# Patient Record
Sex: Female | Born: 1965 | Race: Black or African American | Hispanic: No | Marital: Married | Smoking: Former smoker
Health system: Southern US, Community
[De-identification: ages and names within clinical notes are randomized; demographics above are authoritative.]

## PROBLEM LIST (undated history)

## (undated) DIAGNOSIS — D649 Anemia, unspecified: Secondary | ICD-10-CM

## (undated) DIAGNOSIS — D259 Leiomyoma of uterus, unspecified: Secondary | ICD-10-CM

## (undated) DIAGNOSIS — I82402 Acute embolism and thrombosis of unspecified deep veins of left lower extremity: Secondary | ICD-10-CM

## (undated) DIAGNOSIS — B019 Varicella without complication: Secondary | ICD-10-CM

## (undated) HISTORY — DX: Anemia, unspecified: D64.9

## (undated) HISTORY — PX: TUBAL LIGATION: SHX77

## (undated) HISTORY — PX: CARDIAC CATHETERIZATION: SHX172

## (undated) HISTORY — DX: Varicella without complication: B01.9

---

## 1999-02-17 ENCOUNTER — Other Ambulatory Visit: Admission: RE | Admit: 1999-02-17 | Discharge: 1999-02-17 | Payer: Self-pay | Admitting: *Deleted

## 1999-10-02 ENCOUNTER — Encounter (HOSPITAL_COMMUNITY): Admission: RE | Admit: 1999-10-02 | Discharge: 1999-10-11 | Payer: Self-pay | Admitting: *Deleted

## 1999-10-02 ENCOUNTER — Inpatient Hospital Stay (HOSPITAL_COMMUNITY): Admission: AD | Admit: 1999-10-02 | Discharge: 1999-10-02 | Payer: Self-pay | Admitting: *Deleted

## 1999-10-10 ENCOUNTER — Inpatient Hospital Stay (HOSPITAL_COMMUNITY): Admission: AD | Admit: 1999-10-10 | Discharge: 1999-10-10 | Payer: Self-pay | Admitting: Obstetrics & Gynecology

## 1999-10-11 ENCOUNTER — Inpatient Hospital Stay (HOSPITAL_COMMUNITY): Admission: AD | Admit: 1999-10-11 | Discharge: 1999-10-15 | Payer: Self-pay | Admitting: Obstetrics & Gynecology

## 2000-07-01 ENCOUNTER — Other Ambulatory Visit: Admission: RE | Admit: 2000-07-01 | Discharge: 2000-07-01 | Payer: Self-pay | Admitting: Obstetrics & Gynecology

## 2001-05-27 ENCOUNTER — Other Ambulatory Visit: Admission: RE | Admit: 2001-05-27 | Discharge: 2001-05-27 | Payer: Self-pay | Admitting: *Deleted

## 2002-05-22 ENCOUNTER — Other Ambulatory Visit: Admission: RE | Admit: 2002-05-22 | Discharge: 2002-05-22 | Payer: Self-pay | Admitting: *Deleted

## 2003-09-18 ENCOUNTER — Other Ambulatory Visit: Admission: RE | Admit: 2003-09-18 | Discharge: 2003-09-18 | Payer: Self-pay | Admitting: Obstetrics and Gynecology

## 2004-01-08 ENCOUNTER — Encounter: Admission: RE | Admit: 2004-01-08 | Discharge: 2004-01-08 | Payer: Self-pay | Admitting: Obstetrics and Gynecology

## 2004-01-30 ENCOUNTER — Inpatient Hospital Stay (HOSPITAL_COMMUNITY): Admission: AD | Admit: 2004-01-30 | Discharge: 2004-01-30 | Payer: Self-pay | Admitting: Obstetrics and Gynecology

## 2004-01-31 ENCOUNTER — Inpatient Hospital Stay (HOSPITAL_COMMUNITY): Admission: AD | Admit: 2004-01-31 | Discharge: 2004-01-31 | Payer: Self-pay | Admitting: Obstetrics and Gynecology

## 2004-02-01 ENCOUNTER — Inpatient Hospital Stay (HOSPITAL_COMMUNITY): Admission: AD | Admit: 2004-02-01 | Discharge: 2004-02-10 | Payer: Self-pay | Admitting: Obstetrics & Gynecology

## 2004-02-07 ENCOUNTER — Encounter (INDEPENDENT_AMBULATORY_CARE_PROVIDER_SITE_OTHER): Payer: Self-pay | Admitting: Specialist

## 2004-02-13 ENCOUNTER — Ambulatory Visit (HOSPITAL_COMMUNITY): Admission: RE | Admit: 2004-02-13 | Discharge: 2004-02-13 | Payer: Self-pay | Admitting: Obstetrics and Gynecology

## 2005-09-07 HISTORY — PX: BUNIONECTOMY: SHX129

## 2008-01-04 ENCOUNTER — Other Ambulatory Visit: Admission: RE | Admit: 2008-01-04 | Discharge: 2008-01-04 | Payer: Self-pay | Admitting: Gynecology

## 2011-01-06 ENCOUNTER — Other Ambulatory Visit: Payer: Self-pay | Admitting: Obstetrics and Gynecology

## 2011-01-06 ENCOUNTER — Other Ambulatory Visit (HOSPITAL_COMMUNITY)
Admission: RE | Admit: 2011-01-06 | Discharge: 2011-01-06 | Disposition: A | Discharge status: Home / Self Care | Payer: BC Managed Care – PPO | Source: Ambulatory Visit | Attending: Obstetrics and Gynecology | Admitting: Obstetrics and Gynecology

## 2011-01-06 DIAGNOSIS — Z09 Encounter for follow-up examination after completed treatment for conditions other than malignant neoplasm: Secondary | ICD-10-CM

## 2011-01-06 DIAGNOSIS — Z01419 Encounter for gynecological examination (general) (routine) without abnormal findings: Secondary | ICD-10-CM | POA: Insufficient documentation

## 2011-01-13 ENCOUNTER — Ambulatory Visit
Admission: RE | Admit: 2011-01-13 | Discharge: 2011-01-13 | Disposition: A | Discharge status: Home / Self Care | Payer: BC Managed Care – PPO | Source: Ambulatory Visit | Attending: Obstetrics and Gynecology | Admitting: Obstetrics and Gynecology

## 2011-01-13 ENCOUNTER — Other Ambulatory Visit: Payer: Self-pay | Admitting: Obstetrics and Gynecology

## 2011-01-13 DIAGNOSIS — Z09 Encounter for follow-up examination after completed treatment for conditions other than malignant neoplasm: Secondary | ICD-10-CM

## 2011-01-23 NOTE — Discharge Summary (Signed)
[name], [name]                            ACCOUNT NO.:  [id number]   MEDICAL RECORD NO.:  [id number]                   PATIENT TYPE:  INP   LOCATION:  9374                                 FACILITY:  [facility]   PHYSICIAN:  Maxie Better, M.D.            DATE OF BIRTH:  11-20-1965   DATE OF ADMISSION:  02/01/2004  DATE OF DISCHARGE:  02/10/2004                                 DISCHARGE SUMMARY   ADMISSION DIAGNOSES:  1. Mild preeclampsia.  2. Intrauterine gestation at 29 weeks.  3. Probable early intrauterine growth restriction.  4. Previous cesarean section.  5. Desires permanent sterilization.   DISCHARGE DIAGNOSES:  1. Severe preeclampsia.  2. Intrauterine growth restriction delivered.  3. Previous cesarean section.  4. Desires sterilization.  5. Intrauterine gestation at 30 weeks delivered.  6. Pulmonary edema resolving.  7. Left paratubal cyst.   PROCEDURE:  On February 07, 2004, was a repeat cesarean section, modified Pomeroy  tubal ligation, left paratubal cyst removal.   HISTORY OF PRESENT ILLNESS:  This 45 year old gravida 2, para 0-1-0-1  married black female at [redacted] weeks gestation, which is consistent with a first  trimester ultrasound done on January 11th at 9 and 4/7th weeks, admitted for  close fetal surveillance secondary to newly diagnosed mild preeclampsia.  The patient's prenatal course had been uncomplicated until May 24th, at  which time she presented for her routine obstetrical care and was noted to  have a blood pressure of 130/90 with proteinuria.  PIH labs were ordered, 24-  urine was done as well.  Ultrasound done on May 24th showed estimated fetal  weight of 1 pound 14 ounces.  The patient was seen by perinatologist, Dr.  Royston Sinner regarding this ultrasound.  The estimated fetal weight there was 920  grams which is about 2 pounds, 19th percentile, but her abdominal  circumferential was lagging about 2 weeks.  The umbilical Dopplers were  slightly  elevated.  The [facility] labs showed a platelet count of 169,000,  hematocrit was 38.7, her SGOT was 27, her urine had 600 mg of protein  in a  24 hour period.  The patient was placed on bedrest as an outpatient, and  presented on May 27th for a nonstress test at which time she had increased  protein on dipstick, blood pressures were 140/88.  It was thought that this  patient would be better served by being inpatient with close monitoring.  Her obstetrical history had been notable for a preterm delivery at 35 weeks  due to pregnancy induced hypertension, she had a low transverse cesarean  section.  The patient expressed desire for primary sterilization early in  the prenatal care.   HOSPITAL COURSE:  The patient was admitted to [facility].  NICU  consultation was obtained.  Group B strep culture was performed.  PIH labs  were reordered.  On hospital day #1, the patient had a blood pressure  of  159/102, 160/104.  Betamethasone had been completed.  Magnesium was  subsequently started.  Repeat PIH labs showed a uric acid of 4.4, platelet  count was 201,000,  24-hour urine was still pending.  Ultrasound done which  showed an amniotic fluid index of 12.2, biophysical profile was 6 out of 8  vertex.  The Dopplers were normal.  The fetal monitoring had decreased  variability, however there was no deceleration.  She was also started on  Aldomet.  The patient was followed up closely with daily biophysical profile  and Dopplers.  She was remained on continuous monitoring and PIH labs were  then repeated.  Her 24-urine collection subsequently showed 2688 mg worth of  protein.  The PIH labs remained stable.  Her Dopplers were slightly elevated  on Feb 04, 2004, and the middle cerebral artery was normal.  The patient, on  hospital day #4, was complaining of tightening in her chest, her exam was  notable for decreased breath sounds at her bases.  Tracing had a fetal heart  rate of 130s with decreased  long term variability.  Her abdomen was gravid,  nontender.  A chest x-ray was ordered that showed mild infiltrate, probable  pulmonary edema.  Lasix was given intravenously.  The patient was monitored  closely.  Her chest tightness subsequently resolved after the intravenous  Lasix.  The patient had a recurrence of her shortness of breath on Feb 05, 2004, her magnesium was decreased to 1.5 grams per hour, repeat chest x-ray  was performed.  On February 07, 2004, the Aldomet had been increased to 500 mg  t.i.d., magnesium sulfate had been discontinued.  On February 07, 2004, I have  reviewed her hospital course  the date while in my absence, decision was  made to proceed with the delivery of this infant, primarily for maternal  indications.  The patient was still desired her primary sterilization.  A  repeat chest x-ray was ordered that still showed pulmonary edema.  The  patient was taken to the operating room where she underwent a repeat  cesarean section, modified Pomeroy tubal ligation and left paratubal cyst  removal.  Her lower uterine segment was noted to be paper thin.  Apgar's  were seven and eight.  A live female was delivered, she weighed 2.3 pounds,  cord pH was 7.33, her placenta which was small was sent to pathology.  The  report from the pathology is not available in the chart at the time of this  dictation.  Postoperatively the patient was placed in the intensive care  unit.  She had some rales in the recovery room for which she received  another dose of Lasix.  She was started on magnesium with limited total  fluid volume, and hydrochlorothiazide was also started as well, her Aldomet  was continued.  The patient had a good amount of diuresis noted.  Her workup  for her severe preeclampsia had included a lupus, an antiphospholipid  antibody panel, all of which were negative.  On February 08, 2004, the patient started to have increasing shortness of breath, her O2 sat was about 93% on   room air, magnesium level was 6, magnesium was discontinued around 3:00.  Her exam clinically was the lungs with decreased breath sounds throughout,  her extremities -- she had trace edema bilaterally, nontender.  Neuro exam  was nonfocal.  Chest x-ray was repeated -- bilateral pleural edema with  increased opacity in the left lower quadrant was  noted, mild increase in  cardiac enlargement.  The patient was started on oxygen.  Critical Care was  called for consultation.  BNP was ordered.  The plan was if elevated, a 2D  echo would be performed.  The patient was aggressively diuresed.  She was  seen by one of the pulmonary critical care physicians.  The patient was much  improved by postop day #3.  The baby was in the NICU and doing well.  The  patient was then able to lay flat.  She was more comfortable.  She was not  short of breath.  She had passed flatus.  Her pain management was well-  controlled.  Her lungs were clear to auscultation bilaterally.  The incision  had no erythema, induration, was stable.  Her blood pressures were 110-  130/70s to 80s, 97% oxygenation on room air, temp was 97.5.  She reported  some right knee pain that had been present for about three weeks, and had  been using nonsteroidals and Tylenol p.r.n., and planned for orthopedic  followup.  The patient wanted to go home.  The patient was felt to be  clinically improved.  She was deemed ready to be discharged home.   DISPOSITION:  Home.   CONDITION:  Stable.   DISCHARGE MEDICATIONS:  1. Hydrochlorothiazide 25 mg 1 p.o. daily.  2. Prenatal vitamins 1 p.o. daily.  3. Tylox 1-2 tabs every 3-4 hours p.r.n. pain.  4. Motrin 800 mg 1 p.o. q.6-8 hours p.r.n. pain.   FOLLOWUP:  Followup appointment in [facility] with blood pressure check  on Wednesday, and BP checks at home.  Discharge instructions were given  which included a postpartum booklet, as well as to call for blood pressures  -- diastolics greater or  equal to 105 and systolic greater or equal to 150.                                               Maxie Better, M.D.    [place]/MEDQ  D:  02/23/2004  T:  02/25/2004  Job:  [id number]

## 2011-01-23 NOTE — Op Note (Signed)
[facility]  Patient:    [name]                          MRN: [id number] Proc. Date: 10/12/99 Adm. Date:  [id number] Attending:  Cleatrice Burke                           Operative Report  PREOPERATIVE DIAGNOSIS:       Fetal bradycardia.  POSTOPERATIVE DIAGNOSIS:      Fetal bradycardia.  PROCEDURE:                    Low transverse cesarean delivery/  SURGEON:                      Cecilio Asper, M.D.  ANESTHESIA:                   Epidural.  ESTIMATED BLOOD LOSS:         400 cc.  URINE OUTPUT:                 600 cc.  FINDINGS:                     Viable female infant named [name], Apgars 8 and  weighing 3 pounds 10 ounces.  Fundal fibroid, normal tubes and ovaries.  The cord gas was 7.09.  COMPLICATIONS:                None.  HISTORY:                      The patient is a 45 year old gravida 1 who presented on October 11, 1999 for a repeat CST secondary to an equivocal CST on February nd. The patients CST had a late deceleration after a contraction.  At that point, the patient was admitted for induction of labor.  The patient had Cervidil on the afternoon of October 11, 1999. The Cytotec was dislodged at approximately 8 p.m. and therefore the patients membranes were ruptured.  An attempt to place an intrauterine pressure catheter was attempted at that time.  It could not be performed.  The patient was then started on Pitocin augmentation.  Throughout the night, the Pitocin was slowly increased.  The patient had epidural placed around 3 a.m.  At that point the fetal heart tracing showed deep variables with each contraction, so the Pitocin was discontinued.  The plan at that point was to give the infant a time to rest and then restart the Pitocin.  Should variable represent IUPC would be placed to start amnio effusion.  Variables did represent, but this [name] was not notified.  At approximately 7:30 a.m. upon  reevaluation of the  patient, it was noted that the Pitocin was discontinued, the cervix was 3 cm, 100% effaced and at that point there were no variable decelerations.  Intrauterine pressure catheter was then placed.  At that point, the fetal heart tracings sustained the bradycardia that did not resolve with oxygen by face mask, changing positions or increasing IV fluids.  The patient was then consented for a STAT cesarean delivery .  DESCRIPTION OF PROCEDURE:     After an adequate level of epidural anesthesia was obtained, the patient was quickly prepped and draped in a sterile fashion.  Low  transverse incision was made down through subcutaneous fat to the  fascia and the fascia was incised with the scalpel.  Kelly clamps were placed on the superior dge of the incision and the rectus muscles were removed from the fascia both superiorly and inferiorly.  Parietal peritoneum was entered by sharp and blunt dissection nd extended bluntly.  Bladder piece was placed inside the incision.  The uterus was entered in a low transverse cesarean fashion and extended laterally in both directions with the Mayo scissors.  The cord and the infants hand were presenting. The infants head was elevated and with fundal pressure she was delivered.  The cord was doubly clamped and cut and she was taken to the warmer where she was cared for by the respiratory team.  Cord bloods were obtained.  Placenta was manually  extracted.  The uterus was wiped clean with wet lap sponge.  Tubes and ovaries ere noted to be normal.  There was a fundal fibroid, mostly centered towards the patients left-hand side.  The bladder piece was replaced into the incision. The uterine incision was grasped with ring clamps and the uterus was reapproximated  with a running suture of #0 Vicryl.  A second imbricating layer was used with good hemostatic result.  The pelvis was copiously irrigated.  Hemostasis was assured. The  parietal peritoneum was then reapproximated.  The fascia was reapproximated  with a running suture of #1 Vicryl from the lateral edge to the midline.  The subcuticular layer was irrigated.  The skin was closed with staples.  The patient tolerated the procedure well.  She was taken to the recovery room in stable condition.  Needle, sponge and instrument counts were correct x 2. DD:  10/12/99 TD:  10/12/99 Job: [id number]

## 2011-01-23 NOTE — H&P (Signed)
[name], [name]                            ACCOUNT NO.:  [id number]   MEDICAL RECORD NO.:  [id number]                   PATIENT TYPE:  INP   LOCATION:  9159                                 FACILITY:  [facility]   PHYSICIAN:  Maxie Better, M.D.            DATE OF BIRTH:  September 22, 1965   DATE OF ADMISSION:  02/01/2004  DATE OF DISCHARGE:                                HISTORY & PHYSICAL   CHIEF COMPLAINT:  Preeclampsia at 41 weeks' gestation.   HISTORY OF PRESENT ILLNESS:  This is a 45 year old gravida 2, para 0-1-0-1,  married black female, EDC of April 18, 2004, based on first trimester  ultrasound done on September 18, 2003 at 9 weeks and 4 days' gestation, who is  currently at 67 weeks' gestation, admitted for close fetal surveillance  secondary to newly diagnosed preeclampsia.  The patient has had an  uncomplicated prenatal course until Jan 29, 2004, at which time she  presented for her routine obstetrical visit and was noted to have a blood  pressure of 130/90 and proteinuria.  Repeat blood pressures on her left side  were 148/98 and 142/92.  The patient has had lower extremity edema.  She  denies any headaches, visual changes or epigastric pain.  The patient had,  at the time of the visit on Jan 29, 2004, a 7-pound weight gain, compared to  the previous visit on Jan 08, 2004.  PIH labs were ordered; 24-hour urine  creatinine clearance and urine total protein were also ordered.  Due to the  elevated blood pressure, an ultrasound was ordered to estimate fetal weight  and an ultrasound done at [facility] on Jan 29, 2004 showed estimated  fetal weight of 1 pounds 14 ounces, which was at the 0 percentile, amniotic  fluid index of 50%, equaling 14.5, normal Dopplers, good fetal movement.  The patient was sent for second opinion due to this acute change in  ultrasound to Dr. Sherrie George at [facility],  consultation for a second  opinion, on the same day.  The patient's ultrasound at  their facility  revealed estimated fetal weight of 920 g, which was 2 pounds, which was at  the 19th percentile; however, the abdominal circumference measurement lagged  by 2 weeks.  The patient underwent umbilical Dopplers that showed slight  elevation.  The thought by the perinatologist was that this most likely is  probably a growth-restricted fetus secondary to placental insufficiency due  to the maternal hypertension.  Recommendations were made at the time for  twice-weekly nonstress tests, weekly amniotic fluid index and Dopplers and a  repeat growth scan in 3 weeks, with the patient being on bedrest and close  followup for her hypertension.  The [facility] labs that were ordered showed a  platelet count of 169,000, a hematocrit of 38.7, creatinine of 1.0, uric  acid of 5.3, SGOT of 27.  The patient's 24-hour urine collection showed  a  urine total volume of 1700, creatinine clearance of 68.7 mL/min in her 24-  hour urine collection of 600 mg in a 24-hour period of time.  The patient  presented on Feb 01, 2004 for her nonstress test and followup of her blood  pressures.  The nonstress test was nonreactive, but her blood pressure was  130/88, repeat was 140/88 and a urine dip stick showed 300+ of protein with  trace blood.  Given the prior 24-hour urine collection results on Jan 31, 2004 and the ongoing blood pressure, diagnosis was made of preeclampsia and  it was felt that this patient would be best served by being inpatient with  close fetal surveillance.  Prenatal care is at [facility].  Primary  obstetrician -- Dr. Maxie Better.   PRENATAL LABORATORIES:  Blood type is A-positive.  Antibody screen is  negative.  Hemoglobin electrophoresis was normal.  RPR is nonreactive.  Rubella is immune.  Hepatitis B surface antigen is negative.  HIV test was  negative.  GC and Chlamydia cultures were negative.  Pap was normal.  The  patient had first trimester genetic screen that was  normal and open neural  tube defect testing at 16 weeks was normal.  The anatomic fetal survey was  done on December 04, 2003, at which time the anatomic fetal survey was normal.  Baby weighed 11 ounces, which was at the 16th percentile.  The patient had a  1-hour glucose tolerance test done which was normal.  She underwent steroid  injections on Jan 30, 2004 and Jan 31, 2004 at [facility].  Group B  strep culture ________.  Her booking blood pressure on September 18, 2003 was  90/72.  Her second trimester blood pressures were between 98/62 and 102/64.   ALLERGIES:  No known drug allergies.   MEDICATIONS:  Prenatal vitamins.   PAST MEDICAL HISTORY:  Medical history is negative.   SURGICAL HISTORY:  Cesarean section in 2001.   OBSTETRICAL HISTORY:  February of 2001, low transverse cesarean section, 4-  pound 11-ounce fetus at 35 weeks, for fetal bradycardia at 35 weeks'  gestation; that pregnancy had pregnancy-induced hypertension complications.   FAMILY HISTORY:  Family history is negative.   SOCIAL HISTORY:  Former smoker, quit with positive pregnancy test, married,  hairdresser, 1 daughter.   REVIEW OF SYSTEMS:  Review of systems is negative except as noted with the  history of present illness.   PHYSICAL EXAM:  GENERAL:  A gravid black female, well-developed, well-  nourished, appropriately upset.  VITAL SIGNS:  Blood pressure 130/88.  Weight of 177.2 pounds.  Fetal heart  rate 145-150.  SKIN:  Skin shows no lesions.  HEENT:  Anicteric sclerae.  Pink conjunctivae.  Oropharynx negative.  No  periorbital edema noted.  HEART:  Heart was regular rate and rhythm without murmur.  LUNGS:  Lungs are clear to auscultation.  BREASTS:  Breasts are soft and nontender, no palpable mass.  ABDOMEN:  Abdomen gravid, fundal height of 28 cm.  PELVIC:  Exam deferred.  EXTREMITIES:  Edema 1+.  No clonus.   IMPRESSION:  Early onset preeclampsia, intrauterine gestation at 28 weeks, probable  early intrauterine growth restriction.   PLAN:  Admission, continuous fetal monitoring, repeat a 24-hour creatinine  clearance and urine total protein as well as the PIH labs; also evaluate for  other etiologies for her hypertension such as lupus and antiphospholipid  antibody syndrome, lupus anticoagulant and anticardiolipin antibody as well  as an  ANA were therefore ordered.  Daily BPD/Dopplers to evaluate this  patient, including on admission.  Bedrest with bathroom privileges.  An NICU  consultation.  Group B strep culture.  The patient does desire to have a  repeat cesarean section as well as she also desires permanent sterilization;  the sterilization has been discussed at length with the patient and  alternative birth control methods have been declined.  This patient was  informed indications for delivery will be based on either maternal or fetal  indication.                                               Maxie Better, M.D.    [place]/MEDQ  D:  02/01/2004  T:  02/02/2004  Job:  [id number]

## 2011-01-23 NOTE — Op Note (Signed)
[name], [name]                            ACCOUNT NO.:  [id number]   MEDICAL RECORD NO.:  [id number]                   PATIENT TYPE:  INP   LOCATION:  9374                                 FACILITY:  [facility]   PHYSICIAN:  Maxie Better, M.D.            DATE OF BIRTH:  Jan 09, 1966   DATE OF PROCEDURE:  02/07/2004  DATE OF DISCHARGE:                                 OPERATIVE REPORT   PREOPERATIVE DIAGNOSES:  1. Severe preeclampsia.  2. Intrauterine growth restriction.  3. Intrauterine gestation at 30 weeks.  4. Previous cesarean section.  5. Desires sterilization.   PROCEDURES:  1. Repeat cesarean section.  2. Modified Pomeroy tubal ligation.  3. Left paratubal cyst removal.   POSTOPERATIVE DIAGNOSES:  1. Severe preeclampsia.  2. Intrauterine growth restriction.  3. Intrauterine pregnancy at 30 weeks.  4. Previous cesarean section.  5. Desires sterilization.  6. Left paratubal cyst.   ANESTHESIA:  Spinal.   SURGEON:  Maxie Better, M.D.   ASSISTANT:  Cordelia Pen A. Rosalio Macadamia, M.D.   PROCEDURE:  Under adequate spinal anesthesia, the patient was placed in a  supine position with a left lateral tilt.  An indwelling Foley catheter was  already in place.  The patient was sterilely prepped and draped in the usual  fashion.  The patient was noted to have a large Pfannenstiel keloid  incision.  Marcaine 0.25% was injected along the previous scar.  A  Pfannenstiel skin incision was made just above the previous scar, carried  down to the rectus fascia, rectus fascia incised in the midline and extended  bilaterally.  The rectus fascia was then bluntly and sharply dissected off  the rectus muscle in a superior and inferior fashion.  The rectus muscle was  then split in the midline.  The parietal peritoneum was entered superiorly.  On entering the abdominal cavity, it was noted that the lower uterine  segment was very thin.  The bladder was taken down off the lower uterine  segment and careful low transverse uterine incision was then made with a  very thin lower uterine segment noted and maintaining the bulging membranes  as intact.  With the transverse incision performed, palpation through the  bag was then noted and done.  Vertex presentation was still noted.  The  artificial rupture of membranes was performed, clear fluid was noted.  Subsequent delivery gently of a live female infant was then accomplished.  The cord was clamped, cut, the baby was bulb-suctioned on the abdomen.  The  baby was quickly transferred to the awaiting pediatricians, who assigned  Apgars of 7 and 8 at one and five minutes.  The placenta was small and  delivered spontaneously and sent to pathology.  The cord gases and cord pH  were performed.  Cord pH was subsequently 7.32.  The uterine cavity was then  cleaned of debris.  There was no uterine extension noted.  The uterine  incision was then closed with 0 Monocryl running locked stitch.  Small  bleeding along the peritoneal edges was then done.  Attention was then  turned to the tubes and ovaries bilaterally.  The left tube was noted to  have a paratubal cyst, but this was removed carefully.  The midportion of  the left fallopian tube was then identified.  The underlying mesosalpinx was  then opened using cautery.  The proximal and distal portion of that tube  segment were tied with 0 chromic x2 proximally and distally.  The  intervening portion of tube was then removed.  The mesosalpinx was  inspected, good hemostasis was noted.  On the contralateral side the same  procedure was repeated on the right tube with the midportion of the  fallopian tube removed as well.  The mesosalpinx had small bleeding, which  was then cauterized.  There were some peritubal adhesions noted, which were  then lysed.  Both ovaries were noted to be normal.  Attention was then  turned back to the uterine incision with good hemostasis noted.  The abdomen   was then copiously irrigated and suctioned of debris.  The vesicouterine  peritoneum was not closed.  The parietal peritoneum was not closed.  The  undersurface of the rectus muscle was inspected as well as the rectus fascia  and small bleeders cauterized.  Good hemostasis then noted.  The rectus  fascia was then closed with 0 Vicryl x2.  The keloid incision was then  removed and the subcutaneous area was approximated using interrupted 3-0  plain sutures.  The skin was approximated using Ethicon staples.  Specimen  was placenta, portion of the right and left fallopian tube, and left  paratubal cyst, sent to pathology.  Estimated blood loss was 200 mL.  Intraoperative fluid was 700 mL crystalloid.  Urine output was 250 mL clear  yellow urine.  Sponge and instrument count x2 was correct.  Complication was  none.  The patient's baby weighed 930 g.  The patient tolerated the  procedure well, was transferred to the recovery room in stable condition.                                               Maxie Better, M.D.    [place]  D:  02/07/2004  T:  02/08/2004  Job:  [id number]

## 2011-01-23 NOTE — Discharge Summary (Signed)
[facility]  Patient:    [name], [name]                         MRN: [id number] Adm. Date:  [id number] Disc. Date: [id number] Attending:  Cleatrice Burke Dictator:   Vance Gather Duplantis, C.N.M.                           Discharge Summary  ADMISSION DIAGNOSES:          1. Intrauterine pregnancy at term.                               2. History of chronic hypertension.                               3. History of oligohydramnios.                               4. History of equivocal contraction stress test on                                  October 10, 1999, with positive contraction stress                                  test on October 11, 1999.  DISCHARGE DIAGNOSES:          1. Intrauterine pregnancy at term.                               2. History of chronic hypertension.                               3. History of oligohydramnios.                               4. History of equivocal contraction stress test on                                  October 10, 1999, with positive contraction stress                                  test on October 11, 1999.                               5. Bottle feeding infant with small for gestational                                  age in the neonatal intensive care unit.                               6. Fetal bradycardia.  7. Desires oral contraceptives.  PROCEDURE THIS ADMISSION:    Primary low transverse cesarean section for delivery of a viable female infant named Marissa, who had Apgars of 8 and 9 and weighed 3 pounds 10 ounces, by Dr. Cleatrice Burke on October 12, 1999.  HOSPITAL COURSE:              Mrs. [name] is a 45 year old married black female gravida 1 para 0 at 78 weeks who is admitted for induction of labor secondary to oligohydramnios, hypertension, and a positive CST on October 11, 1999. She had an uninducible cervix on admission and was given  cervical ripening initially, and then was started on Pitocin throughout the night.  By the morning of October 11, 1999, she was 3 cm, 100%, and an IUPC was placed in order to begin amnioinfusion to resolve the variable decelerations that were noted while on Pitocin.  Subsequent to placing the IUPC, the fetal heart rate dropped to the 50s and did not resolve with position changes, IV fluids, or oxygen.  Patient was then sent to the OR for a STAT cesarean delivery, and was delivered of a viable female infant named Marissa, who had Apgars of 8 and 9 and weighed 3 pounds 10 ounces, by Dr. Cleatrice Burke on the morning of October 12, 1999.  Postoperatively, she has done well.  She is now ambulating, voiding, and eating without difficulty. Her vital signs reveal temperature has been afebrile, basically, since delivery and her blood pressures have been labile, with some elevations up to diastolics of 100, but the majority in the 70s-80s.  She is bottle feeding.  Her infant is in the NICU and is doing very well.  She desires oral contraceptives when she is able to take them. She is deemed ready for discharge today.  DISCHARGE INSTRUCTIONS:       As per the [facility] handout.  DISCHARGE FOLLOW-UP:          Will be Friday for a blood pressure check and staple removal.  DISCHARGE MEDICATIONS:        1. Motrin 600 mg p.o. q.6h. p.r.n. for pain.                               2. Tylox 1-2 p.o. q.4-6h. p.r.n. for pain.                               3. Prenatal vitamins daily.  DISCHARGE LABORATORY:         Her hemoglobin is 12.2, her WBC count is 10.2, and her platelets are 129. DD:  10/15/99 TD:  10/15/99 Job: [id number]

## 2011-12-07 ENCOUNTER — Ambulatory Visit (INDEPENDENT_AMBULATORY_CARE_PROVIDER_SITE_OTHER): Payer: BC Managed Care – PPO | Admitting: Family Medicine

## 2011-12-07 ENCOUNTER — Ambulatory Visit (HOSPITAL_COMMUNITY)
Admission: RE | Admit: 2011-12-07 | Discharge: 2011-12-07 | Disposition: A | Discharge status: Home / Self Care | Payer: BC Managed Care – PPO | Source: Ambulatory Visit | Attending: Family Medicine | Admitting: Family Medicine

## 2011-12-07 VITALS — BP 99/66 | HR 93 | Temp 98.5°F | Resp 18 | Ht 66.5 in | Wt 131.0 lb

## 2011-12-07 DIAGNOSIS — M79609 Pain in unspecified limb: Secondary | ICD-10-CM

## 2011-12-07 DIAGNOSIS — I809 Phlebitis and thrombophlebitis of unspecified site: Secondary | ICD-10-CM

## 2011-12-07 LAB — POCT CBC
Granulocyte percent: 70.6 %G (ref 37–80)
HCT, POC: 35 % — AB (ref 37.7–47.9)
Lymph, poc: 1.4 (ref 0.6–3.4)
MID (cbc): 0.6 (ref 0–0.9)
MPV: 10 fL (ref 0–99.8)
POC Granulocyte: 4.9 (ref 2–6.9)
POC MID %: 9 %M (ref 0–12)
Platelet Count, POC: 200 10*3/uL (ref 142–424)
RDW, POC: 14.5 %
WBC: 7 10*3/uL (ref 4.6–10.2)

## 2011-12-07 MED ORDER — IBUPROFEN 800 MG PO TABS: 800.0000 mg | ORAL_TABLET | Freq: Three times a day (TID) | ORAL | Status: AC | PRN

## 2011-12-07 NOTE — Progress Notes (Signed)
VASCULAR LAB PRELIMINARY  PRELIMINARY  PRELIMINARY  PRELIMINARY  Right lower extremity venous duplex completed.    Preliminary report:  Right:  No evidence of DVT, superficial thrombosis, or Baker's cyst.  Enlarged, vascularized lymph node at the area of interest in the groin.  Terance Hart, RVT 12/07/2011, 5:53 PM

## 2011-12-07 NOTE — Patient Instructions (Signed)
Thank you for coming in today.  I appreciate your patience as we become more comfortable with our computer system.  Today you saw Cynthia Garland, MD. I hope you feel better quickly. If you were not given printed prescriptions today, your medications have been sent to your specified pharmacy and can be picked up there.  Please review the information below regarding your diagnosis(es) at your leisure.     Phlebitis Phlebitis is a redness, tenderness and soreness (inflammation) in a vein. This can occur in your arms, legs, or torso (trunk), as well as deeper inside your body.  CAUSES  Phlebitis can be triggered by multiple factors. These include:  Reduced (restricted) blood flow through your veins. This happens with prolonged bed rest, long distance travel, injury or surgery. Being overweight (obese) and pregnant can also restrict blood flow and lead to phlebitis.   Putting a catheter in the vein (intravenous or IV) and giving certain medications through in the vein (intravenously).   Cancer and cancer treatment.   Use of illegal intravenous drugs.   Inflammatory diseases.   Inherited (genetic) diseases that increase the risk for blood clots.   Hormone therapy (such as birth control pills).  SYMPTOMS   Red, tender, swollen, painful area on your skin.   Usually, the area will be long and narrow.   Low grade fever.   Significant firmness along the center of this area. This can indicate that a blood clot has formed.   Surrounding redness or a high fever, which can indicate an infection (cellulitis).  DIAGNOSIS   The appearance of your condition and your symptoms will cause your caregiver to suspect phlebitis. Usually, this is enough for a diagnosis.   Your caregiver may request blood tests or an ultrasound test of the area to be sure you do not have an infection or a blood clot. Blood tests and discussing your family history may also indicate if you have an underlying genetic disease  that causes blood clots.   Occasionally, a piece of tissue is taken from the body (biopsy) if an unusual cause of phlebitis is suspected.  TREATMENT   Raise (elevate) the affected area above the level of the heart.   Apply a warm compress or heating pad for 20 minutes, 3 or 4 times a day. If you use an electric heating pad, follow the directions so you do not burn yourself.   Anti-inflammatory medications are usually recommended. Follow your caregiver's directions.   Any IV catheter, if present, will be removed by your caregiver.   Your caregiver may prescribe medicines that kill germs (antibiotics) if an infection is present.   Your caregiver may recommend blood thinners if a blood clot is suspected or present.   Support stockings or bandages may be helpful, depending on the cause and location of the phlebitis.   Surgery may be needed to remove very damaged sections of vein, but this is rare.  HOME CARE INSTRUCTIONS   Take medications exactly as prescribed.   Follow up with your caregiver as directed.   Use support stockings or bandages if advised. These will speed healing and prevent recurrence.   If you are on blood thinners:   Do follow-up blood tests exactly as directed.   Check with your caregiver before using any new medications.   Wear a pendant to show that you are on blood thinners.   For phlebitis in the legs:   Avoid prolonged standing or bed rest.   Keep your legs moving.  Raise your legs with sitting or lying.   Do not smoke.   Women, particularly those over the age of 40, should consider the risks and benefits of taking the contraceptive pill. This kind of hormone treatment can increase your risk for blood clots.  SEEK MEDICAL CARE IF:   You have unusual bruising or any bleeding problems.   Swelling or pain in your affected arm or leg is not gradually improving.   You are on anti-inflammatory medication and you develop belly (abdominal) pain.  SEEK  IMMEDIATE MEDICAL CARE IF:   An unexplained oral temperature above 100.5 F (38.1 C) develops.   You have sudden onset of chest pain or difficulty breathing.  Document Released: 08/18/2001 Document Revised: 08/13/2011 Document Reviewed: 05/20/2009 [facility] Patient Information 2012 [place], [place].

## 2011-12-07 NOTE — Progress Notes (Signed)
  Subjective:    Patient ID: [name], female    DOB: 1966-06-13, 46 y.o.   MRN: [id number]  HPI 46 yo female with leg and back pain complaints.  Woke up yesterday feeling stiff and sore.  Noticed red knot on right upper thigh.  Enlarged, still painful, and achy all over, especially lower extremities.  No fever.  Took ibuprofen yesterday, helped a little bit.    No history of blood clot Review of Systems Negative except as per HPI     Objective:   Physical Exam  Constitutional: She appears well-developed.  Pulmonary/Chest: Effort normal.  Neurological: She is alert.  Skin:       1) - erythematous, warm area with palpable cord.  Very mildly TTP.  Results for orders placed in visit on 12/07/11  POCT CBC      Component Value Range   WBC 7.0  4.6 - 10.2 (K/uL)   Lymph, poc 1.4  0.6 - 3.4    POC LYMPH PERCENT 20.4  10 - 50 (%L)   MID (cbc) 0.6  0 - 0.9    POC MID % 9.0  0 - 12 (%M)   POC Granulocyte 4.9  2 - 6.9    Granulocyte percent 70.6  37 - 80 (%G)   RBC 3.88 (*) 4.04 - 5.48 (M/uL)   Hemoglobin 11.2 (*) 12.2 - 16.2 (g/dL)   HCT, POC 95.2 (*) 84.1 - 47.9 (%)   MCV 90.2  80 - 97 (fL)   MCH, POC 28.9  27 - 31.2 (pg)   MCHC 32.0  31.8 - 35.4 (g/dL)   RDW, POC 32.4     Platelet Count, POC 200  142 - 424 (K/uL)   MPV 10.0  0 - 99.8 (fL)      Assessment & Plan:  Likely superficial thrombophlebitis, however will obtain duplex ultrasound to ensure no DVT.  If negative, treat with elevation, warm compresses, and Aleve.

## 2012-09-07 DIAGNOSIS — I82402 Acute embolism and thrombosis of unspecified deep veins of left lower extremity: Secondary | ICD-10-CM

## 2012-09-07 HISTORY — DX: Acute embolism and thrombosis of unspecified deep veins of left lower extremity: I82.402

## 2012-12-15 ENCOUNTER — Ambulatory Visit (INDEPENDENT_AMBULATORY_CARE_PROVIDER_SITE_OTHER): Payer: BC Managed Care – PPO | Admitting: Emergency Medicine

## 2012-12-15 VITALS — BP 112/66 | HR 82 | Temp 98.6°F | Resp 16 | Ht 67.0 in | Wt 134.6 lb

## 2012-12-15 DIAGNOSIS — S335XXA Sprain of ligaments of lumbar spine, initial encounter: Secondary | ICD-10-CM

## 2012-12-15 MED ORDER — CYCLOBENZAPRINE HCL 10 MG PO TABS: 10.0000 mg | ORAL_TABLET | Freq: Three times a day (TID) | ORAL | Status: DC | PRN

## 2012-12-15 MED ORDER — OMEPRAZOLE 40 MG PO CPDR: 40.0000 mg | DELAYED_RELEASE_CAPSULE | Freq: Every day | ORAL | Status: DC

## 2012-12-15 MED ORDER — NAPROXEN SODIUM 550 MG PO TABS: 550.0000 mg | ORAL_TABLET | Freq: Two times a day (BID) | ORAL | Status: AC

## 2012-12-15 NOTE — Progress Notes (Signed)
[facility] [address] [place] [address] [phone]  Date:  12/15/2012   Name:  [name]   DOB:  [birth year]   MRN:  [id number]  PCP:  Abbe Amsterdam, MD    Chief Complaint: Back Pain   History of Present Illness:  [name] is a 47 y.o. very pleasant female patient who presents with the following:  Developed low back pain over the past week.  No history of direct injury or overuse.  Pain nonradiating.  No associated neuro symptoms.  Interferes with sleep.  No improvement with over the counter medications or other home remedies. Denies other complaint or health concern today..   There is no problem list on file for this patient.   No past medical history on file.  Past Surgical History  Procedure Laterality Date  . Cesarean section      History  Substance Use Topics  . Smoking status: Current Every Day Smoker    Types: Cigarettes  . Smokeless tobacco: Never Used  . Alcohol Use: No    Family History  Problem Relation Age of Onset  . Diabetes Mother   . Diabetes Maternal Grandmother   . Heart disease Maternal Grandfather   . Tuberculosis Paternal Grandfather     Allergies  Allergen Reactions  . Bc Powder (Aspirin-Salicylamide-Caffeine)   . Ibuprofen     Medication list has been reviewed and updated.  No current outpatient prescriptions on file prior to visit.   No current facility-administered medications on file prior to visit.    Review of Systems:  As per HPI, otherwise negative.    Physical Examination: Filed Vitals:   12/15/12 0941  BP: 112/66  Pulse: 82  Temp: 98.6 F (37 C)  Resp: 16   Filed Vitals:   12/15/12 0941  Height: 5\' 7"  (1.702 m)  Weight: 134 lb 9.6 oz (61.054 kg)   Body mass index is 21.08 kg/(m^2). Ideal Body Weight: Weight in (lb) to have BMI = 25: 159.3   GEN: WDWN, NAD, Non-toxic, Alert & Oriented x 3 HEENT: Atraumatic, Normocephalic.  Ears and Nose: No external  deformity. EXTR: No clubbing/cyanosis/edema NEURO: Normal gait.  PSYCH: Normally interactive. Conversant. Not depressed or anxious appearing.  Calm demeanor.  BACK::  Tender lumbar para spinous muscles.     Assessment and Plan: Anaprox Flexeril Lumbar strain. Local heat Signed,  Phillips Odor, MD

## 2012-12-15 NOTE — Patient Instructions (Addendum)

## 2013-06-02 ENCOUNTER — Other Ambulatory Visit: Payer: Self-pay | Admitting: Obstetrics and Gynecology

## 2014-02-11 ENCOUNTER — Ambulatory Visit (INDEPENDENT_AMBULATORY_CARE_PROVIDER_SITE_OTHER): Payer: BC Managed Care – PPO | Admitting: Physician Assistant

## 2014-02-11 VITALS — BP 104/72 | HR 97 | Temp 97.9°F | Resp 14 | Ht 67.0 in | Wt 131.0 lb

## 2014-02-11 DIAGNOSIS — J309 Allergic rhinitis, unspecified: Secondary | ICD-10-CM

## 2014-02-11 DIAGNOSIS — R42 Dizziness and giddiness: Secondary | ICD-10-CM

## 2014-02-11 LAB — POCT CBC
Granulocyte percent: 52.9 %G (ref 37–80)
HCT, POC: 36.3 % — AB (ref 37.7–47.9)
HEMOGLOBIN: 11 g/dL — AB (ref 12.2–16.2)
Lymph, poc: 1.5 (ref 0.6–3.4)
MCH, POC: 27.2 pg (ref 27–31.2)
MCHC: 30.3 g/dL — AB (ref 31.8–35.4)
MCV: 89.7 fL (ref 80–97)
MID (cbc): 0.4 (ref 0–0.9)
MPV: 10.1 fL (ref 0–99.8)
POC Granulocyte: 2.2 (ref 2–6.9)
POC LYMPH PERCENT: 36.8 %L (ref 10–50)
POC MID %: 10.3 %M (ref 0–12)
Platelet Count, POC: 283 10*3/uL (ref 142–424)
RBC: 4.05 M/uL (ref 4.04–5.48)
RDW, POC: 15.4 %
WBC: 4.1 10*3/uL — AB (ref 4.6–10.2)

## 2014-02-11 LAB — COMPREHENSIVE METABOLIC PANEL
ALBUMIN: 3.9 g/dL (ref 3.5–5.2)
ALT: 8 U/L (ref 0–35)
AST: 15 U/L (ref 0–37)
Alkaline Phosphatase: 47 U/L (ref 39–117)
BUN: 17 mg/dL (ref 6–23)
CALCIUM: 9.1 mg/dL (ref 8.4–10.5)
CHLORIDE: 105 meq/L (ref 96–112)
CO2: 26 mEq/L (ref 19–32)
Creat: 0.77 mg/dL (ref 0.50–1.10)
Glucose, Bld: 74 mg/dL (ref 70–99)
POTASSIUM: 4.3 meq/L (ref 3.5–5.3)
Sodium: 138 mEq/L (ref 135–145)
TOTAL PROTEIN: 7.3 g/dL (ref 6.0–8.3)
Total Bilirubin: 0.3 mg/dL (ref 0.2–1.2)

## 2014-02-11 LAB — TSH: TSH: 0.861 u[IU]/mL (ref 0.350–4.500)

## 2014-02-11 LAB — GLUCOSE, POCT (MANUAL RESULT ENTRY): POC GLUCOSE: 73 mg/dL (ref 70–99)

## 2014-02-11 MED ORDER — FLUTICASONE PROPIONATE 50 MCG/ACT NA SUSP: 2.0000 | Freq: Every day | NASAL | Status: DC

## 2014-02-11 MED ORDER — MECLIZINE HCL 50 MG PO TABS: 50.0000 mg | ORAL_TABLET | Freq: Three times a day (TID) | ORAL | Status: DC | PRN

## 2014-02-11 NOTE — Progress Notes (Signed)
Discussed patient with Christell Faith [facility] and ekg examined.  Agree with treatment plan

## 2014-02-11 NOTE — Progress Notes (Signed)
Subjective:    Patient ID: [name], female    DOB: [birth year], 48 y.o.   MRN: [id number]  HPI Primary Physician: Lamar Blinks, MD  Chief Complaint: Dizziness x 3 days and facial pain x 2 days  HPI: 48 y.o. female with history below presents with 3 day history of dizziness and facial pain x 2 days. Also notes ear fullness. She is currently asymptomatic. Patient states for the past 3 days she has experienced episodic "dizziness" lastly about one hour. No associated chest pain, chest tightness, palpitation, syncope, headache, tinnitus, numbness, tingling, weakness, or slurred speech. Feels like her ears want to pop, but will not. Dizziness last for about an hour then self resolves. She did taken Mucinex x 1 two days ago along with water with good results, but stopped because she did not want to go to the bathroom frequently with all of the water. 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 Three days ago took a nap in her car while her daughter was at cheerleading practice, this is something out of the ordinary for her. Has had one headache which was when she woke up 02/09/14. She took [facility] Powder and it resolved with two of those during an 8 hour period. She gets up to void x 1 some night, this is baseline for her, but most nights she sleeps right through. No polyuria, polydipsia, some polyphagia. Some urinary urgency. No urinary frequency or dysuria. She is "always cold." No heat intolerance. No constipation. No diarrhea.     Past Medical History  Diagnosis Date  . Anemia      Home Meds: Prior to Admission medications   Not on File    Allergies: No Known Allergies  History   Social History  . Marital Status: Married    Spouse Name: N/A    Number of Children: N/A  . Years of Education: N/A   Occupational History  . Not on file.   Social History Main Topics  . Smoking status: Current Every Day Smoker    Types: Cigarettes  .  Smokeless tobacco: Never Used  . Alcohol Use: No  . Drug Use: No  . Sexual Activity: Not on file   Other Topics Concern  . Not on file   Social History Narrative  . No narrative on file     Review of Systems  Constitutional: Positive for fatigue. Negative for fever, chills and appetite change.  HENT: Positive for hearing loss and sinus pressure. Negative for congestion, ear pain, postnasal drip, rhinorrhea, sneezing, sore throat and tinnitus.        "Ears feel clogged." "Ears popping like I have been on a plane or in the mountains or something."  Respiratory: Negative for cough, shortness of breath and wheezing.   Cardiovascular: Negative for chest pain and palpitations.  Gastrointestinal: Negative for nausea, vomiting, diarrhea and constipation.  Endocrine: Positive for cold intolerance and polyphagia. Negative for heat intolerance, polydipsia and polyuria.       Some gets up to void x 1 at night at baseline.   Genitourinary: Positive for urgency. Negative for dysuria, frequency, hematuria and vaginal bleeding.  Skin: Negative for rash.  Neurological: Positive for dizziness, light-headedness and headaches. Negative for syncope, weakness and numbness.       "Feel like I'm dizzy."       Objective:   Physical Exam  Physical Exam: Blood pressure 104/72, pulse 97, temperature 97.9 F (36.6 C), temperature  source Oral, resp. rate 14, height 5\' 7"  (1.702 m), weight 131 lb (59.421 kg), last menstrual period 01/31/2014, SpO2 98.00%., Body mass index is 20.51 kg/(m^2). General: Well developed, well nourished, in no acute distress. Head: Normocephalic, atraumatic, eyes without discharge, sclera non-icteric, nares are without discharge. Bilateral auditory canals clear, TM's are without perforation, pearly grey and translucent with reflective cone of light bilaterally. Oral cavity moist, posterior pharynx without exudate, erythema, peritonsillar abscess, or post nasal drip. Uvula midline.      Neck: Supple. No thyromegaly. Full ROM. No lymphadenopathy. No nuchal rigidity.  Lungs: Clear bilaterally to auscultation without wheezes, rales, or rhonchi. Breathing is unlabored. Heart: RRR with S1 S2. No murmurs, rubs, or gallops appreciated. Msk:  Strength and tone normal for age. Extremities/Skin: Warm and dry. No clubbing or cyanosis. No edema. No rashes or suspicious lesions. Neuro: Alert and oriented X 3. Moves all extremities spontaneously. Gait is normal. CNII-XII grossly in tact. Unremarkable cerebellar function. Rhomberg normal.  Psych:  Responds to questions appropriately with a normal affect.    EKG: NSR, no acute findings, no comparison   Labs: Results for orders placed in visit on 02/11/14  POCT CBC      Result Value Ref Range   WBC 4.1 (*) 4.6 - 10.2 K/uL   Lymph, poc 1.5  0.6 - 3.4   POC LYMPH PERCENT 36.8  10 - 50 %L   MID (cbc) 0.4  0 - 0.9   POC MID % 10.3  0 - 12 %M   POC Granulocyte 2.2  2 - 6.9   Granulocyte percent 52.9  37 - 80 %G   RBC 4.05  4.04 - 5.48 M/uL   Hemoglobin 11.0 (*) 12.2 - 16.2 g/dL   HCT, POC 36.3 (*) 37.7 - 47.9 %   MCV 89.7  80 - 97 fL   MCH, POC 27.2  27 - 31.2 pg   MCHC 30.3 (*) 31.8 - 35.4 g/dL   RDW, POC 15.4     Platelet Count, POC 283  142 - 424 K/uL   MPV 10.1  0 - 99.8 fL  GLUCOSE, POCT (MANUAL RESULT ENTRY)      Result Value Ref Range   POC Glucose 73  70 - 99 mg/dl    CMP and TSH pending     Assessment & Plan:  49 year old female with dizziness/vertigo and anemia  1) Dizziness/vertigo -Antivert 50 mg tid prn #60 RF 0 -Flonase 2 sprays each nare daily #1 RF 6 -Mucinex -Await labs -RTC precautions -Close follow up -Discussed with Dr. Linna Darner   2) Anemia -Stable -Follow up with PCP   Christell Faith, MHS, PA-C [facility] [place], [place] [address] [facility] 02/11/2014 11:27 AM

## 2014-03-07 ENCOUNTER — Ambulatory Visit (INDEPENDENT_AMBULATORY_CARE_PROVIDER_SITE_OTHER): Payer: BC Managed Care – PPO | Admitting: Family Medicine

## 2014-03-07 ENCOUNTER — Telehealth: Payer: Self-pay

## 2014-03-07 VITALS — BP 116/78 | HR 87 | Temp 97.8°F | Resp 18 | Ht 66.75 in | Wt 135.6 lb

## 2014-03-07 DIAGNOSIS — R42 Dizziness and giddiness: Secondary | ICD-10-CM

## 2014-03-07 NOTE — Progress Notes (Signed)
[facility] [address] [place] [address] [phone]  Date:  03/07/2014   Name:  [name]   DOB:  24-Aug-1966   MRN:  [id number]  PCP:  Lamar Blinks, MD    Chief Complaint: Dizziness   History of Present Illness:  [name] is a 48 y.o. very pleasant female patient who presents with the following:  Here today to discuss vertigo.  She was seen here about 3 weeks ago by Ryan/ Dr. Linna Darner.  tx with antivert, flonase, mucinex.  When her sx first started she thought due to overheating.  She took a nap and cooled off and felt better.   Labs showed normal TSH, normal glucose, etc  She notes that since her last visit she had continued to have intermittent feeling of vertigo and ear fullness.  She has noted sx every few days Her ears do not hurt, but will "open and close" like she is changing altitude. No significant HA, rare nausea, no vomiting.   No recent trauma She had an eye exam and all was ok, and her vision and hearing seem ok.  She can notice some tinnitus in both ears.    She has never had this in the past.   She does notice that holding her head still helps with her sx.  If she moves her head she will be dizzy again.  Generally she is in excellent health.     There are no active problems to display for this patient.   Past Medical History  Diagnosis Date  . Anemia     Past Surgical History  Procedure Laterality Date  . Cesarean section      History  Substance Use Topics  . Smoking status: Current Every Day Smoker    Types: Cigarettes  . Smokeless tobacco: Never Used  . Alcohol Use: No    Family History  Problem Relation Age of Onset  . Diabetes Mother   . Diabetes Maternal Grandmother   . Heart disease Maternal Grandfather   . Tuberculosis Paternal Grandfather     No Known Allergies  Medication list has been reviewed and updated.  Current Outpatient Prescriptions on File Prior to Visit  Medication Sig Dispense  Refill  . fluticasone (FLONASE) 50 MCG/ACT nasal spray Place 2 sprays into both nostrils daily.  16 g  6  . meclizine (ANTIVERT) 50 MG tablet Take 1 tablet (50 mg total) by mouth 3 (three) times daily as needed.  90 tablet  5   No current facility-administered medications on file prior to visit.    Review of Systems:  As per HPI- otherwise negative.   Physical Examination: Filed Vitals:   03/07/14 0950  BP: 116/78  Pulse: 87  Temp: 97.8 F (36.6 C)  Resp: 18   Filed Vitals:   03/07/14 0950  Height: 5' 6.75" (1.695 m)  Weight: 135 lb 9.6 oz (61.508 kg)   Body mass index is 21.41 kg/(m^2). Ideal Body Weight: Weight in (lb) to have BMI = 25: 158.1  GEN: WDWN, NAD, Non-toxic, A & O x 3, slim build, looks well HEENT: Atraumatic, Normocephalic. Neck supple. No masses, No LAD. Bilateral TM wnl, oropharynx normal.  PEERL,EOMI.   Ears and Nose: No external deformity. CV: RRR, No M/G/R. No JVD. No thrill. No extra heart sounds. PULM: CTA B, no wheezes, crackles, rhonchi. No retractions. No resp. distress. No accessory muscle use. ABD: S, NT, ND, +BS. No rebound. No HSM. EXTR: No c/c/e NEURO Normal  gait.   Complete exam including strength, sensation and DTR all extremities, movement and sensation of face, and cerebellar function wnl . PSYCH: Normally interactive. Conversant. Not depressed or anxious appearing.  Calm demeanor.    Assessment and Plan: Dizziness - Plan: Ambulatory referral to ENT  Suspect BPPV.  Discussed with her in detail. At this point she would like to see ENT for persistent sx which is fine- made refferal for her for next week.  She will let me know if any worsening in the meantime   Signed Lamar Blinks, MD

## 2014-03-07 NOTE — Telephone Encounter (Signed)
Called and spoke with pt regarding her ENT referral. Pt notified of appt at [facility] on 7/7 at 1:30.

## 2014-03-14 ENCOUNTER — Telehealth: Payer: Self-pay

## 2014-03-14 NOTE — Telephone Encounter (Signed)
Pt states that the ENT only did ear/hearing test. She was told she did not have Vertigo. She was put on prednisone to help clear up any fluid they are not able to see. She was prescribed a high blood pressure medication (HCTZ) and she does not want to take it because she does not have HTN. Her last BP was 116/74. She would like to have a CT scan. She is still very off-balance and have spells of dizziness.

## 2014-03-14 NOTE — Telephone Encounter (Signed)
Pt states she went to the ent appt but felt as though nothing was done and would like to talk with dr copland to schedule a ct scan   Please call [phone]

## 2014-03-15 NOTE — Telephone Encounter (Signed)
Called her back- she did see ENT.  She was seen by ENT and rx HCTZ and prednisone.  She just started this a couple of days ago.  She still feels "off balance." She wonders if she needs a CT scan.  Explained that in this case an MRI is probably more helpful, but his can be pretty expensive.  Suggested that she give the medication from the ENT a few more days to work since her sx have now been present for nearly a month.  She agrees with this plan and will give me a call if not improved in a few days

## 2014-03-28 ENCOUNTER — Ambulatory Visit (INDEPENDENT_AMBULATORY_CARE_PROVIDER_SITE_OTHER): Payer: BC Managed Care – PPO | Admitting: Family Medicine

## 2014-03-28 ENCOUNTER — Telehealth: Payer: Self-pay

## 2014-03-28 VITALS — BP 122/70 | HR 89 | Temp 98.3°F | Resp 14 | Ht 66.25 in | Wt 134.6 lb

## 2014-03-28 DIAGNOSIS — M79661 Pain in right lower leg: Secondary | ICD-10-CM

## 2014-03-28 DIAGNOSIS — H8113 Benign paroxysmal vertigo, bilateral: Secondary | ICD-10-CM

## 2014-03-28 DIAGNOSIS — R42 Dizziness and giddiness: Secondary | ICD-10-CM

## 2014-03-28 DIAGNOSIS — H8110 Benign paroxysmal vertigo, unspecified ear: Secondary | ICD-10-CM

## 2014-03-28 DIAGNOSIS — I82401 Acute embolism and thrombosis of unspecified deep veins of right lower extremity: Secondary | ICD-10-CM

## 2014-03-28 DIAGNOSIS — M79609 Pain in unspecified limb: Secondary | ICD-10-CM

## 2014-03-28 DIAGNOSIS — I82409 Acute embolism and thrombosis of unspecified deep veins of unspecified lower extremity: Secondary | ICD-10-CM

## 2014-03-28 MED ORDER — METHOCARBAMOL 500 MG PO TABS: 500.0000 mg | ORAL_TABLET | Freq: Three times a day (TID) | ORAL | Status: DC | PRN

## 2014-03-28 NOTE — Patient Instructions (Addendum)
You can go for your doppler (ultrasound) tomorrow at 8am, [facility].  I will be in touch with your ultrasound and lab results asap.  We will also set up an MRI for you, and I am referring you to neurology  Try the muscle relaxer before bed for your leg cramps

## 2014-03-28 NOTE — Telephone Encounter (Signed)
Pt is still having an "off balance feeling" Today was worse than she had been. She was vomiting this morning. She had lack of grasp yesterday, no feeling in her fingertips. She had some leg cramps in her right calf, still does today and it is swollen. Today it is really sore down to her toes. She is concerned it might be a blood clot. Advised pt to come into the office to be seen. Pt states she will come in.

## 2014-03-28 NOTE — Telephone Encounter (Signed)
PT STATES THE DR TOLD HER TO CALL IF NO BETTER AND WE WOULD REFER HER TO HAVE A CT SCAN. [place] [phone]

## 2014-03-28 NOTE — Progress Notes (Addendum)
[facility] [address] [place] [address] [phone]  Date:  03/28/2014   Name:  [name]   DOB:  [birth year]   MRN:  [id number]  PCP:  Lamar Blinks, MD    Chief Complaint: Follow-up, Cramps and Grip   History of Present Illness:  [name] is a 48 y.o. very pleasant female patient who presents with the following:  She has been seen a couple of times over the last 6-7 weeks with vertigo. Her sx will recur every few days and can last a few minutes to a couple of hours.  I saw her most recently on 7/1 and referred her to ENT for likely BPPV.  However the prednisone she was given did not help; she is now done with this.    She called today because she had more significant vertigo today-she had sensation of movement and also vomited.  She was not able to "function normally" for about 4 hours.   She also has noted cramps in her right foot and right leg- she had a bad one yesterday that left her leg tender today.  She has noted this for about one week- always the right leg.    She is a [employer] park manager, and has noted decreased strength in both hands.  She has noted this just for the last couple of days.   She is having mild headaches, nothing severe Today was the first time she vomited. Otherwise she has been able to eat normally   She has her menses now.    There are no active problems to display for this patient.   Past Medical History  Diagnosis Date  . Anemia     Past Surgical History  Procedure Laterality Date  . Cesarean section      History  Substance Use Topics  . Smoking status: Current Every Day Smoker    Types: Cigarettes  . Smokeless tobacco: Never Used  . Alcohol Use: No    Family History  Problem Relation Age of Onset  . Diabetes Mother   . Diabetes Maternal Grandmother   . Heart disease Maternal Grandfather   . Tuberculosis Paternal Grandfather     No Known Allergies  Medication list has been reviewed and  updated.  Current Outpatient Prescriptions on File Prior to Visit  Medication Sig Dispense Refill  . fluticasone (FLONASE) 50 MCG/ACT nasal spray Place 2 sprays into both nostrils daily.  16 g  6  . meclizine (ANTIVERT) 50 MG tablet Take 1 tablet (50 mg total) by mouth 3 (three) times daily as needed.  90 tablet  5   No current facility-administered medications on file prior to visit.    Review of Systems:  As per HPI- otherwise negative.   Physical Examination: Filed Vitals:   03/28/14 1616  BP: 122/70  Pulse: 89  Temp: 98.3 F (36.8 C)  Resp: 14   Filed Vitals:   03/28/14 1616  Height: 5' 6.25" (1.683 m)  Weight: 134 lb 9.6 oz (61.054 kg)   Body mass index is 21.55 kg/(m^2). Ideal Body Weight: Weight in (lb) to have BMI = 25: 155.7  GEN: WDWN, NAD, Non-toxic, A & O x 3, slim build, looks well HEENT: Atraumatic, Normocephalic. Neck supple. No masses, No LAD.  Bilateral TM wnl, oropharynx normal.  PEERL,EOMI.   Ears and Nose: No external deformity. CV: RRR, No M/G/R. No JVD. No thrill. No extra heart sounds. PULM: CTA B, no wheezes, crackles, rhonchi. No retractions. No resp.  distress. No accessory muscle use. ABD: S, NT, ND. No rebound. No HSM. EXTR: No c/c/e NEURO Normal gait. Normal strength, sensation and DTR of all limbs, normal facial movement.  Normal romberg and balance  PSYCH: Normally interactive. Conversant. Not depressed or anxious appearing.  Calm demeanor.  No apparent enlargement of the right calf.  She is tender when walking  Results for orders placed in visit on 03/28/14  COMPREHENSIVE METABOLIC PANEL      Result Value Ref Range   Sodium 136  135 - 145 mEq/L   Potassium 3.8  3.5 - 5.3 mEq/L   Chloride 102  96 - 112 mEq/L   CO2 27  19 - 32 mEq/L   Glucose, Bld 83  70 - 99 mg/dL   BUN 17  6 - 23 mg/dL   Creat 0.74  0.50 - 1.10 mg/dL   Total Bilirubin 0.3  0.2 - 1.2 mg/dL   Alkaline Phosphatase 64  39 - 117 U/L   AST 17  0 - 37 U/L   ALT 17  0 -  35 U/L   Total Protein 7.6  6.0 - 8.3 g/dL   Albumin 3.7  3.5 - 5.2 g/dL   Calcium 9.1  8.4 - 10.5 mg/dL    Assessment and Plan: Vertigo - Plan: Ambulatory referral to Neurology, MR Brain W Wo Contrast  Benign paroxysmal positional vertigo, bilateral - Plan: Comprehensive metabolic panel  Right calf pain - Plan: Lower Extremity Venous Duplex Right, methocarbamol (ROBAXIN) 500 MG tablet  Referral to neurology for persistent episodic vertigo.  Will order MRI in the meantime. Will plan further follow- up pending labs. Robaxin to use as needed for her leg cramps, and will send her for an ultrasound tomorrow to rule- out DVT.  She has no prior history of DVT but she does smoke.   If any severe or persistent sx she will seek help right away  Signed Lamar Blinks, MD  7/23: received her [place] report- she does have a RLE DVT.  Will have her MRI later today prior to starting anticoagulation. Her renal and liver function test are all normal.  Discussed xarelto vs lovenox/ bridge to coumadin.  She will think about this and decide pending her MRI result.    Received MRI result- all ok.  She does have some non- specific white matter disease; will have her discuss further with neurology. She would like to use lovenox bridge to coumadin. I have send lovenox and coumain to her drug store.  She will pick these up and come in to clinic for teaching and labs (CBC, INR, HCG) prior to starting therapy.

## 2014-03-29 ENCOUNTER — Telehealth: Payer: Self-pay

## 2014-03-29 ENCOUNTER — Encounter: Payer: Self-pay | Admitting: Family Medicine

## 2014-03-29 ENCOUNTER — Ambulatory Visit (HOSPITAL_COMMUNITY)
Admission: RE | Admit: 2014-03-29 | Discharge: 2014-03-29 | Disposition: A | Discharge status: Home / Self Care | Payer: BC Managed Care – PPO | Source: Ambulatory Visit | Attending: Family Medicine | Admitting: Family Medicine

## 2014-03-29 ENCOUNTER — Other Ambulatory Visit (INDEPENDENT_AMBULATORY_CARE_PROVIDER_SITE_OTHER): Payer: BC Managed Care – PPO | Admitting: Family Medicine

## 2014-03-29 ENCOUNTER — Ambulatory Visit
Admission: RE | Admit: 2014-03-29 | Discharge: 2014-03-29 | Disposition: A | Discharge status: Home / Self Care | Payer: BC Managed Care – PPO | Source: Ambulatory Visit | Attending: Family Medicine | Admitting: Family Medicine

## 2014-03-29 DIAGNOSIS — I82401 Acute embolism and thrombosis of unspecified deep veins of right lower extremity: Secondary | ICD-10-CM

## 2014-03-29 DIAGNOSIS — M79609 Pain in unspecified limb: Secondary | ICD-10-CM

## 2014-03-29 DIAGNOSIS — R42 Dizziness and giddiness: Secondary | ICD-10-CM

## 2014-03-29 DIAGNOSIS — M79661 Pain in right lower leg: Secondary | ICD-10-CM

## 2014-03-29 DIAGNOSIS — I82409 Acute embolism and thrombosis of unspecified deep veins of unspecified lower extremity: Secondary | ICD-10-CM

## 2014-03-29 LAB — COMPREHENSIVE METABOLIC PANEL
ALT: 17 U/L (ref 0–35)
AST: 17 U/L (ref 0–37)
Albumin: 3.7 g/dL (ref 3.5–5.2)
Alkaline Phosphatase: 64 U/L (ref 39–117)
BILIRUBIN TOTAL: 0.3 mg/dL (ref 0.2–1.2)
BUN: 17 mg/dL (ref 6–23)
CO2: 27 mEq/L (ref 19–32)
CREATININE: 0.74 mg/dL (ref 0.50–1.10)
Calcium: 9.1 mg/dL (ref 8.4–10.5)
Chloride: 102 mEq/L (ref 96–112)
Glucose, Bld: 83 mg/dL (ref 70–99)
Potassium: 3.8 mEq/L (ref 3.5–5.3)
Sodium: 136 mEq/L (ref 135–145)
Total Protein: 7.6 g/dL (ref 6.0–8.3)

## 2014-03-29 LAB — POCT CBC
GRANULOCYTE PERCENT: 64.9 % (ref 37–80)
HEMATOCRIT: 34.9 % — AB (ref 37.7–47.9)
HEMOGLOBIN: 11.1 g/dL — AB (ref 12.2–16.2)
Lymph, poc: 1.5 (ref 0.6–3.4)
MCH, POC: 27.9 pg (ref 27–31.2)
MCHC: 31.9 g/dL (ref 31.8–35.4)
MCV: 87.2 fL (ref 80–97)
MID (cbc): 0.5 (ref 0–0.9)
MPV: 8.6 fL (ref 0–99.8)
POC GRANULOCYTE: 3.6 (ref 2–6.9)
POC LYMPH PERCENT: 26.4 %L (ref 10–50)
POC MID %: 8.7 %M (ref 0–12)
Platelet Count, POC: 277 10*3/uL (ref 142–424)
RBC: 4 M/uL — AB (ref 4.04–5.48)
RDW, POC: 17.6 %
WBC: 5.5 10*3/uL (ref 4.6–10.2)

## 2014-03-29 LAB — POCT URINE PREGNANCY: Preg Test, Ur: NEGATIVE

## 2014-03-29 MED ORDER — GADOBENATE DIMEGLUMINE 529 MG/ML IV SOLN
12.0000 mL | Freq: Once | INTRAVENOUS | Status: AC | PRN
  Administered 2014-03-29: 12 mL via INTRAVENOUS

## 2014-03-29 MED ORDER — ENOXAPARIN SODIUM 100 MG/ML ~~LOC~~ SOLN: 1.5000 mg/kg | SUBCUTANEOUS | Status: DC

## 2014-03-29 MED ORDER — WARFARIN SODIUM 5 MG PO TABS: 5.0000 mg | ORAL_TABLET | Freq: Every day | ORAL | Status: DC

## 2014-03-29 NOTE — Progress Notes (Signed)
*  PRELIMINARY RESULTS* Vascular Ultrasound Right lower extremity venous duplex has been completed.  Preliminary findings: Evidence of partial DVT involving a single Right posterior tibial vein from mid to distal calf.  Called results to [facility]. She will speak with Dr. Lorelei Pont and then contact patient with further instructions. Let patient leave.  Landry Mellow, RDMS, RVT  03/29/2014, 9:08 AM

## 2014-03-29 NOTE — Telephone Encounter (Signed)
[facility] called w/call report on Venous Duplex, pt positive for DVT. Called Dr Lorelei Pont and had Lab release pt w/advice that Dr Lorelei Pont will call her shortly to discuss treatment. Dr Lorelei Pont called me back and asked me to check on MRI she ordered for pt and try to have it done today, so that it can be done before starting pt on Xarelto. Butch Penny has already started on precert and will call ins to complete. Then I will sch MRI.

## 2014-03-29 NOTE — Addendum Note (Signed)
Addended by: Lamar Blinks C on: 03/29/2014 03:05 PM   Modules accepted: Orders

## 2014-03-29 NOTE — Telephone Encounter (Signed)
Butch Penny advised that [facility] stated that a precert is not needed for the MR Brain. I scheduled appt for 1:30, arrive at 1 pm at [place] location. Contacted Dr Lorelei Pont and gave her the info. Called pt and advised that Dr Lorelei Pont will be calling her shortly re: treatment for DVT and gave her MRI appt info. Pt agreed to appt.

## 2014-03-30 ENCOUNTER — Telehealth: Payer: Self-pay | Admitting: *Deleted

## 2014-03-30 LAB — PROTIME-INR
INR: 0.94 (ref <1.50)
PROTHROMBIN TIME: 12.6 s (ref 11.6–15.2)

## 2014-03-30 NOTE — Telephone Encounter (Signed)
Per Dr. Edilia Bo called to check on patient. She is doing well with Lovenox shots. Was told to come in on Sunday 04/01/2014 for repeat INR lab only.

## 2014-04-01 ENCOUNTER — Other Ambulatory Visit: Payer: BC Managed Care – PPO | Admitting: Radiology

## 2014-04-01 DIAGNOSIS — I82409 Acute embolism and thrombosis of unspecified deep veins of unspecified lower extremity: Secondary | ICD-10-CM

## 2014-04-01 DIAGNOSIS — I82401 Acute embolism and thrombosis of unspecified deep veins of right lower extremity: Secondary | ICD-10-CM

## 2014-04-01 NOTE — Progress Notes (Signed)
Pt here for labs only. 

## 2014-04-02 ENCOUNTER — Telehealth: Payer: Self-pay | Admitting: Family Medicine

## 2014-04-02 LAB — PROTIME-INR
INR: 1.1 (ref <1.50)
Prothrombin Time: 14.2 seconds (ref 11.6–15.2)

## 2014-04-02 NOTE — Telephone Encounter (Signed)
Spoke with her- increase to coumadin 7.5, recheck in 2 days

## 2014-04-02 NOTE — Telephone Encounter (Signed)
Called but had to [facility]. Please increase coumadin to 7.5 mg a day, recheck INR on Wednesday as a lab visit only.  (today is Monday)

## 2014-04-03 ENCOUNTER — Encounter: Payer: Self-pay | Admitting: Family Medicine

## 2014-04-03 NOTE — Progress Notes (Signed)
Subjective:    Patient ID: [name], female    DOB: 02-Jan-1966, 48 y.o.   MRN: [id number]  03/29/2014  DVT   HPI This 48 y.o. female presents for new diagnosis of DVT RLE.  S/p lower extremity doppler today that revealed DVT R posterior tibial vein.  Has discussed treatment options at length with Dr. Lorelei Pont over the phone; desires Lovenox with Coumadin therapy. Presenting for urine pregnancy, CBC, PT/INR.  Also presenting for Lovenox teaching. Feels stable today; denies worsening leg pain/swelling; denies CP/palpitations/ SOB.    Evaluated by Dr. Lorelei Pont on 03/28/14 for vertigo; scheduled for MRI brain and completed this afternoon.  Referred to neurology for persistent episodic vertigo.  MRI without acute pathology or bleeding; +scattered white matter abnormalities non-specific.   Review of Systems  Respiratory: Negative for shortness of breath.   Cardiovascular: Positive for leg swelling. Negative for chest pain and palpitations.  Neurological: Positive for dizziness.  Hematological: Negative for adenopathy. Does not bruise/bleed easily.    Past Medical History  Diagnosis Date  . Anemia    Past Surgical History  Procedure Laterality Date  . Cesarean section    . Tubal ligation     No Known Allergies Current Outpatient Prescriptions  Medication Sig Dispense Refill  . enoxaparin (LOVENOX) 100 MG/ML injection Inject 0.9 mLs (90 mg total) into the skin daily.  10 Syringe  0  . fluticasone (FLONASE) 50 MCG/ACT nasal spray Place 2 sprays into both nostrils daily.  16 g  6  . meclizine (ANTIVERT) 50 MG tablet Take 1 tablet (50 mg total) by mouth 3 (three) times daily as needed.  90 tablet  5  . methocarbamol (ROBAXIN) 500 MG tablet Take 1 tablet (500 mg total) by mouth every 8 (eight) hours as needed for muscle spasms.  20 tablet  0  . warfarin (COUMADIN) 5 MG tablet Take 1 tablet (5 mg total) by mouth daily. Adjust as directed  50 tablet  2   No current facility-administered  medications for this visit.   History   Social History  . Marital Status: Married    Spouse Name: N/A    Number of Children: N/A  . Years of Education: N/A   Occupational History  . Not on file.   Social History Main Topics  . Smoking status: Current Every Day Smoker    Types: Cigarettes  . Smokeless tobacco: Never Used  . Alcohol Use: No  . Drug Use: No  . Sexual Activity: Not on file   Other Topics Concern  . Not on file   Social History Narrative  . No narrative on file       Objective:    LMP 03/27/2014 Physical Exam  Constitutional: She appears well-developed and well-nourished. No distress.  HENT:  Head: Normocephalic and atraumatic.  Cardiovascular:  Mild swelling RLE diffusely.  Pulmonary/Chest: Effort normal.  Skin: She is not diaphoretic. No erythema.   Results for orders placed in visit on 03/29/14  PROTIME-INR      Result Value Ref Range   Prothrombin Time 12.6  11.6 - 15.2 seconds   INR 0.94  <1.50  POCT CBC      Result Value Ref Range   WBC 5.5  4.6 - 10.2 K/uL   Lymph, poc 1.5  0.6 - 3.4   POC LYMPH PERCENT 26.4  10 - 50 %L   MID (cbc) 0.5  0 - 0.9   POC MID % 8.7  0 - 12 %M  POC Granulocyte 3.6  2 - 6.9   Granulocyte percent 64.9  37 - 80 %G   RBC 4.00 (*) 4.04 - 5.48 M/uL   Hemoglobin 11.1 (*) 12.2 - 16.2 g/dL   HCT, POC 34.9 (*) 37.7 - 47.9 %   MCV 87.2  80 - 97 fL   MCH, POC 27.9  27 - 31.2 pg   MCHC 31.9  31.8 - 35.4 g/dL   RDW, POC 17.6     Platelet Count, POC 277  142 - 424 K/uL   MPV 8.6  0 - 99.8 fL  POCT URINE PREGNANCY      Result Value Ref Range   Preg Test, Ur Negative     LOVENOX ADMINISTERED BY [facility]; LOVENOX EDUCATION PROVIDED BY [facility] AS WELL.    Assessment & Plan:   1. DVT (deep venous thrombosis), right    1. DVT R lower extremity:  New.  S/p first Lovenox injection in office; patient provided on education on Lovenox injections daily for five days; to start Coumadin 5mg  daily today; follow-up with Dr.  Lorelei Pont on Monday, 04/02/14.  Urine pregnancy negative.  2. Vertigo: stable; s/p MRI; referred to neurology by Copland.  No orders of the defined types were placed in this encounter.    No Follow-up on file.    Reginia Forts, M.D.  [facility] [address] [place], [place]  [address] [phone] phone [phone] fax

## 2014-04-04 ENCOUNTER — Other Ambulatory Visit (INDEPENDENT_AMBULATORY_CARE_PROVIDER_SITE_OTHER): Payer: BC Managed Care – PPO | Admitting: Family Medicine

## 2014-04-04 DIAGNOSIS — I82409 Acute embolism and thrombosis of unspecified deep veins of unspecified lower extremity: Secondary | ICD-10-CM

## 2014-04-04 DIAGNOSIS — I82401 Acute embolism and thrombosis of unspecified deep veins of right lower extremity: Secondary | ICD-10-CM

## 2014-04-04 NOTE — Progress Notes (Signed)
Pt here for labs only. 

## 2014-04-05 ENCOUNTER — Ambulatory Visit (INDEPENDENT_AMBULATORY_CARE_PROVIDER_SITE_OTHER): Payer: BC Managed Care – PPO | Admitting: Neurology

## 2014-04-05 ENCOUNTER — Encounter: Payer: Self-pay | Admitting: Neurology

## 2014-04-05 VITALS — BP 106/66 | HR 74 | Ht 67.0 in | Wt 136.0 lb

## 2014-04-05 DIAGNOSIS — R42 Dizziness and giddiness: Secondary | ICD-10-CM

## 2014-04-05 DIAGNOSIS — R5381 Other malaise: Secondary | ICD-10-CM

## 2014-04-05 DIAGNOSIS — R5383 Other fatigue: Secondary | ICD-10-CM

## 2014-04-05 DIAGNOSIS — R51 Headache: Secondary | ICD-10-CM

## 2014-04-05 DIAGNOSIS — R531 Weakness: Secondary | ICD-10-CM

## 2014-04-05 LAB — PROTIME-INR
INR: 1.56 — ABNORMAL HIGH (ref <1.50)
Prothrombin Time: 18.7 seconds — ABNORMAL HIGH (ref 11.6–15.2)

## 2014-04-05 NOTE — Progress Notes (Signed)
Called with labs-  Results for orders placed in visit on 04/04/14  PROTIME-INR      Result Value Ref Range   Prothrombin Time 18.7 (*) 11.6 - 15.2 seconds   INR 1.56 (*) <1.50   She is taking 7.5 mg of coumadin and has 3 days more of lovenox.  She will increase to 10mg  of coumadin and will come in for a recheck INR in 2 days (saturday am- today is Thursday).  If she is still not therapeutic will have to rx more lovenox.

## 2014-04-05 NOTE — Progress Notes (Signed)
[facility]    Provider:  Dr Janann Colonel Referring Provider: Lorelei Pont, Gay Filler, MD Primary Care Physician:  Lamar Blinks, MD  CC:  vertigo  HPI:  [name] is a 48 y.o. female here as a referral from Dr. Lorelei Pont for vertigo  Symptoms started May 28, will occur a few times a week, typically will last 3 to 4 hours. Starts with a sensation of fullness in her ears, gets tinnitus, then develops vertigo. Vertigo described as a sensation of everything moving around her. Vertigo is not triggered by certain movements of her head. No known triggers. Was evaluated by ENT, they question if she may have fluid behind her ears that they cannot see. Started her on 10 day course of prednisone and flonase. She has not noticed any benefit since starting these medications.   Will occasionally have headaches with these events. Are bitemporal, squeezing pounding type pain. Gets associated nausea and emesis. +Photo and phonophobia during the event. No change in vision during the events. Had severe migraines in her 60s and 82s.   She is currently alternating meclizine and promethazine. These give some relief but it is transient.   Recently diagnosed with RLE DVT, started on lovenox bridge to coumadin. No family history of strokes or multiple clots at a young age.   Recent MRI brain 7/23: 1. No evidence of acute intracranial abnormality or mass.  2. Scattered foci of white matter T2 signal abnormality,  nonspecific. Considerations include small vessel ischemia, sequelae  of trauma, hypercoagulable state, vasculitis, migraines, prior  infection or demyelination.  Review of Systems: Out of a complete 14 system review, the patient complains of only the following symptoms, and all other reviewed systems are negative. + weakness, dizziness, cramps, ringing in ears, spinning sensation  History   Social History  . Marital Status: Married    Spouse Name: [name]     Number of Children: 2  .  Years of Education: 12+   Occupational History  . Not on file.   Social History Main Topics  . Smoking status: Current Every Day Smoker    Types: Cigarettes  . Smokeless tobacco: Never Used  . Alcohol Use: No  . Drug Use: No  . Sexual Activity: Not on file   Other Topics Concern  . Not on file   Social History Narrative   Patient lives at home with husband and 2 children. [name]   Patient works at [employer].    Patient has a college education.    Patient is right handed.     Family History  Problem Relation Age of Onset  . Diabetes Mother   . Diabetes Maternal Grandmother   . Heart disease Maternal Grandfather   . Tuberculosis Paternal Grandfather     Past Medical History  Diagnosis Date  . Anemia     Past Surgical History  Procedure Laterality Date  . Cesarean section    . Tubal ligation      Current Outpatient Prescriptions  Medication Sig Dispense Refill  . enoxaparin (LOVENOX) 100 MG/ML injection Inject 0.9 mLs (90 mg total) into the skin daily.  10 Syringe  0  . fluticasone (FLONASE) 50 MCG/ACT nasal spray Place 2 sprays into both nostrils daily.  16 g  6  . hydrochlorothiazide (MICROZIDE) 12.5 MG capsule       . meclizine (ANTIVERT) 50 MG tablet Take 1 tablet (50 mg total) by mouth 3 (three) times daily as needed.  90 tablet  5  . methocarbamol (ROBAXIN)  500 MG tablet Take 1 tablet (500 mg total) by mouth every 8 (eight) hours as needed for muscle spasms.  20 tablet  0  . warfarin (COUMADIN) 5 MG tablet Take 1 tablet (5 mg total) by mouth daily. Adjust as directed  50 tablet  2   No current facility-administered medications for this visit.    Allergies as of 04/05/2014  . (No Known Allergies)    Vitals: BP 106/66  Pulse 74  Ht 5\' 7"  (1.702 m)  Wt 136 lb (61.689 kg)  BMI 21.30 kg/m2  LMP 03/27/2014 Last Weight:  Wt Readings from Last 1 Encounters:  04/05/14 136 lb (61.689 kg)   Last Height:   Ht Readings from Last 1 Encounters:  04/05/14 5\' 7"   (1.702 m)     Physical exam: Exam: Gen: NAD, conversant Eyes: anicteric sclerae, moist conjunctivae HENT: Atraumatic, oropharynx clear Neck: Trachea midline; supple,  Lungs: CTA, no wheezing, rales, rhonic                          CV: RRR, no MRG Abdomen: Soft, non-tender;  Extremities: No peripheral edema  Skin: Normal temperature, no rash,  Psych: Appropriate affect, pleasant  Neuro: MS: AA&Ox3, appropriately interactive, normal affect   Attention: WORLD backwards  Speech: fluent w/o paraphasic error  Memory: good recent and remote recall  CN: PERRL, EOMI no nystagmus, no ptosis, sensation intact to LT V1-V3 bilat, face symmetric, no weakness, hearing grossly intact, palate elevates symmetrically, shoulder shrug 5/5 bilat,  tongue protrudes midline, no fasiculations noted.  Motor: normal bulk and tone Strength: 5/5  In all extremities  Coord: rapid alternating and point-to-point (FNF, HTS) movements intact.  Reflexes: symmetrical, bilat downgoing toes  Sens: LT intact in all extremities  Gait: posture, stance, stride and arm-swing normal. Tandem gait intact. Able to walk on heels and toes. Romberg absent.   Assessment:  After physical and neurologic examination, review of laboratory studies, imaging, neurophysiology testing and pre-existing records, assessment will be reviewed on the problem list.  Plan:  Treatment plan and additional workup will be reviewed under Problem List.  1)Vertigo 2)Headache 3) LE DVT  47y/o woman presenting for initial evaluation of recurrent episodes of vertigo that started around 3 months ago. She occasionally will have headaches associated with these events. Of note she was recently diagnosed with a LE DVT and started on coumadin. She was evaluated by ENT with no clear etiology of her vertigo. Unclear etiology. Differential would include a vestibular process such as BPPV vs [place] vs vertiginous migraines. Recent dx of DVT with  frequent progressive nature of vertigo/headache would raise concern over possible dural sinus thrombosis. Will check MRA/V of head. Discussed vestibular rehab referral but patient wishes to hold off until after her imaging is completed.   Jim Like, DO  [name] [address] [place],  [address]  Phone [phone] Fax [phone]

## 2014-04-07 ENCOUNTER — Other Ambulatory Visit (INDEPENDENT_AMBULATORY_CARE_PROVIDER_SITE_OTHER): Payer: BC Managed Care – PPO | Admitting: Family Medicine

## 2014-04-07 DIAGNOSIS — I82401 Acute embolism and thrombosis of unspecified deep veins of right lower extremity: Secondary | ICD-10-CM

## 2014-04-07 DIAGNOSIS — I82409 Acute embolism and thrombosis of unspecified deep veins of unspecified lower extremity: Secondary | ICD-10-CM

## 2014-04-07 NOTE — Progress Notes (Signed)
Patient here for labs only. 

## 2014-04-08 ENCOUNTER — Telehealth: Payer: Self-pay | Admitting: Family Medicine

## 2014-04-08 DIAGNOSIS — I82409 Acute embolism and thrombosis of unspecified deep veins of unspecified lower extremity: Secondary | ICD-10-CM

## 2014-04-08 LAB — PROTIME-INR
INR: 2.26 — ABNORMAL HIGH (ref <1.50)
Prothrombin Time: 25 seconds — ABNORMAL HIGH (ref 11.6–15.2)

## 2014-04-08 NOTE — Telephone Encounter (Signed)
Called to go over her INR: Results for orders placed in visit on 04/07/14  PROTIME-INR      Result Value Ref Range   Prothrombin Time 25.0 (*) 11.6 - 15.2 seconds   INR 2.26 (*) <1.50   Phone rang but no answer, no machine

## 2014-04-09 NOTE — Telephone Encounter (Signed)
Called and discussed with her- she understands.  Will set her up with coumadin clinic this week.

## 2014-04-10 NOTE — Telephone Encounter (Signed)
Called to set up coumadin clinic appt with [facility] cardiology clinic.   appt tomorrow at 12:10 pm. [address].   Called pt to let her know.

## 2014-04-11 ENCOUNTER — Ambulatory Visit (INDEPENDENT_AMBULATORY_CARE_PROVIDER_SITE_OTHER): Payer: BC Managed Care – PPO | Admitting: Pharmacist Clinician (PhC)/ Clinical Pharmacy Specialist

## 2014-04-11 DIAGNOSIS — I82401 Acute embolism and thrombosis of unspecified deep veins of right lower extremity: Secondary | ICD-10-CM

## 2014-04-11 DIAGNOSIS — Z7901 Long term (current) use of anticoagulants: Secondary | ICD-10-CM | POA: Insufficient documentation

## 2014-04-11 DIAGNOSIS — I82409 Acute embolism and thrombosis of unspecified deep veins of unspecified lower extremity: Secondary | ICD-10-CM

## 2014-04-11 LAB — POCT INR: INR: 2.9

## 2014-04-13 ENCOUNTER — Ambulatory Visit
Admission: RE | Admit: 2014-04-13 | Discharge: 2014-04-13 | Disposition: A | Discharge status: Home / Self Care | Payer: BC Managed Care – PPO | Source: Ambulatory Visit | Attending: Neurology | Admitting: Neurology

## 2014-04-13 DIAGNOSIS — R51 Headache: Secondary | ICD-10-CM

## 2014-04-13 DIAGNOSIS — R42 Dizziness and giddiness: Secondary | ICD-10-CM

## 2014-04-13 DIAGNOSIS — R531 Weakness: Secondary | ICD-10-CM

## 2014-04-15 ENCOUNTER — Ambulatory Visit
Admission: RE | Admit: 2014-04-15 | Discharge: 2014-04-15 | Disposition: A | Discharge status: Home / Self Care | Payer: BC Managed Care – PPO | Source: Ambulatory Visit | Attending: Neurology | Admitting: Neurology

## 2014-04-15 DIAGNOSIS — R531 Weakness: Secondary | ICD-10-CM

## 2014-04-15 DIAGNOSIS — R51 Headache: Secondary | ICD-10-CM

## 2014-04-15 DIAGNOSIS — R42 Dizziness and giddiness: Secondary | ICD-10-CM

## 2014-04-16 NOTE — Progress Notes (Signed)
Quick Note:  Shared MRA results with patient,she verbalized understanding and will call back if she decides to have the vestibular rehab ______

## 2014-04-18 ENCOUNTER — Ambulatory Visit (INDEPENDENT_AMBULATORY_CARE_PROVIDER_SITE_OTHER): Payer: BC Managed Care – PPO | Admitting: Pharmacist Clinician (PhC)/ Clinical Pharmacy Specialist

## 2014-04-18 ENCOUNTER — Ambulatory Visit: Payer: BC Managed Care – PPO | Admitting: Pharmacist Clinician (PhC)/ Clinical Pharmacy Specialist

## 2014-04-18 DIAGNOSIS — Z7901 Long term (current) use of anticoagulants: Secondary | ICD-10-CM

## 2014-04-18 DIAGNOSIS — I82401 Acute embolism and thrombosis of unspecified deep veins of right lower extremity: Secondary | ICD-10-CM

## 2014-04-18 DIAGNOSIS — I82409 Acute embolism and thrombosis of unspecified deep veins of unspecified lower extremity: Secondary | ICD-10-CM

## 2014-04-18 LAB — POCT INR: INR: 4

## 2014-04-25 ENCOUNTER — Ambulatory Visit: Payer: BC Managed Care – PPO

## 2014-05-07 ENCOUNTER — Ambulatory Visit (INDEPENDENT_AMBULATORY_CARE_PROVIDER_SITE_OTHER): Payer: BC Managed Care – PPO | Admitting: Pharmacist Clinician (PhC)/ Clinical Pharmacy Specialist

## 2014-05-07 DIAGNOSIS — I82401 Acute embolism and thrombosis of unspecified deep veins of right lower extremity: Secondary | ICD-10-CM

## 2014-05-07 DIAGNOSIS — I82409 Acute embolism and thrombosis of unspecified deep veins of unspecified lower extremity: Secondary | ICD-10-CM

## 2014-05-07 DIAGNOSIS — Z7901 Long term (current) use of anticoagulants: Secondary | ICD-10-CM

## 2014-05-07 LAB — POCT INR: INR: 1.8

## 2014-05-21 ENCOUNTER — Ambulatory Visit: Payer: BC Managed Care – PPO | Admitting: Pharmacist Clinician (PhC)/ Clinical Pharmacy Specialist

## 2014-05-23 ENCOUNTER — Ambulatory Visit (INDEPENDENT_AMBULATORY_CARE_PROVIDER_SITE_OTHER): Payer: BC Managed Care – PPO | Admitting: Pharmacist Clinician (PhC)/ Clinical Pharmacy Specialist

## 2014-05-23 DIAGNOSIS — I82401 Acute embolism and thrombosis of unspecified deep veins of right lower extremity: Secondary | ICD-10-CM

## 2014-05-23 DIAGNOSIS — I82409 Acute embolism and thrombosis of unspecified deep veins of unspecified lower extremity: Secondary | ICD-10-CM

## 2014-05-23 DIAGNOSIS — Z7901 Long term (current) use of anticoagulants: Secondary | ICD-10-CM

## 2014-05-23 LAB — POCT INR: INR: 2.3

## 2014-06-13 ENCOUNTER — Ambulatory Visit (INDEPENDENT_AMBULATORY_CARE_PROVIDER_SITE_OTHER): Payer: BC Managed Care – PPO | Admitting: Pharmacist Clinician (PhC)/ Clinical Pharmacy Specialist

## 2014-06-13 DIAGNOSIS — Z7901 Long term (current) use of anticoagulants: Secondary | ICD-10-CM

## 2014-06-13 DIAGNOSIS — I82401 Acute embolism and thrombosis of unspecified deep veins of right lower extremity: Secondary | ICD-10-CM

## 2014-06-13 LAB — POCT INR: INR: 3.2

## 2014-06-27 ENCOUNTER — Ambulatory Visit: Payer: BC Managed Care – PPO | Admitting: Pharmacist Clinician (PhC)/ Clinical Pharmacy Specialist

## 2014-06-29 ENCOUNTER — Other Ambulatory Visit: Payer: Self-pay | Admitting: Family Medicine

## 2014-07-03 ENCOUNTER — Telehealth: Payer: Self-pay

## 2014-07-03 ENCOUNTER — Other Ambulatory Visit: Payer: Self-pay | Admitting: Family Medicine

## 2014-07-03 DIAGNOSIS — I82401 Acute embolism and thrombosis of unspecified deep veins of right lower extremity: Secondary | ICD-10-CM

## 2014-07-03 MED ORDER — WARFARIN SODIUM 5 MG PO TABS: 5.0000 mg | ORAL_TABLET | Freq: Every day | ORAL | Status: DC

## 2014-07-03 NOTE — Telephone Encounter (Signed)
Pt states that she needs a refill on coumadin, her pharmacy informed her that they have also sent a prior authorization over the weekend but hey have not received a response back as of yet. Best#[phone]  [facility] on [address]

## 2014-07-03 NOTE — Telephone Encounter (Signed)
Called her back- it sounds like she doesn't need a prior auth, just a refill rx.  Did this for her, she states her coumadin clinic provider has discussed having her stop coumadin in the next month or so which is appropriate

## 2014-08-01 ENCOUNTER — Telehealth: Payer: Self-pay | Admitting: *Deleted

## 2014-08-01 DIAGNOSIS — O2230 Deep phlebothrombosis in pregnancy, unspecified trimester: Secondary | ICD-10-CM

## 2014-08-01 DIAGNOSIS — I82409 Acute embolism and thrombosis of unspecified deep veins of unspecified lower extremity: Secondary | ICD-10-CM | POA: Insufficient documentation

## 2014-08-01 NOTE — Telephone Encounter (Signed)
Hi [name]- I hope that you are doing well!  Ms. [name] and I have talked about it and she will stop her coumadin therapy now as she has been treated for more than 3 months for her distal DVT.  We plan to have her seen hematology for possible hyper- coag panel in a month or so.  Let me know if you have any concerns about this plan or see any reason to continue her treatment.   Thanks, JC

## 2014-08-01 NOTE — Telephone Encounter (Signed)
Message for Dr. Lorelei Pont,  Patient called and stated the Coumadin Clinic keeps wanting her to come in, but Dr. Lorelei Pont did not want the patient on coumadin for long term.   Pt wants to know when she should stop the coumadin.  Pt # [phone]

## 2014-08-01 NOTE — Telephone Encounter (Signed)
Called to discuss with pt.  She has been tx with coumadin for first unprovoked DVT. She is a non- smoker, no hormone use, no travel, no family history.  She has been tx for about 4 months now and would like to stop; she is especially motivated to stop coumadin as her heavy menses are even worse with the medication.  I will send a message to her coumadin nurse.  She would like to see hematology in a month or so for possible hypercoag work-up.  She has gained some weight lately but I assured her this is unlikely to be due to coumadin

## 2014-08-06 ENCOUNTER — Ambulatory Visit: Payer: Self-pay | Admitting: Pharmacist Clinician (PhC)/ Clinical Pharmacy Specialist

## 2014-08-07 ENCOUNTER — Telehealth: Payer: Self-pay | Admitting: Oncology

## 2014-08-07 NOTE — Telephone Encounter (Signed)
Referral forward to Dr. Darnell Level @ [facility].

## 2014-10-15 ENCOUNTER — Telehealth: Payer: Self-pay

## 2014-10-15 NOTE — Telephone Encounter (Signed)
Pt of Dr. Lorelei Pont has some questions about medications she was taken off, has not been taking for past month, and her symptoms have come back; very dizzy, comes and goes through out the day. Should she start taking this medication again? Please advise   warfarin (COUMADIN) 5 MG tablet [[id number]]  promethazine (PHENERGAN) 12.5 MG tablet [[id number]]

## 2014-10-16 NOTE — Telephone Encounter (Signed)
Spoke with pt, advised her to come in and discuss with Dr. Lorelei Pont. Pt agreed and will make an appt.

## 2014-10-22 ENCOUNTER — Other Ambulatory Visit: Payer: Self-pay | Admitting: Obstetrics and Gynecology

## 2014-11-14 ENCOUNTER — Other Ambulatory Visit: Payer: Self-pay | Admitting: Obstetrics and Gynecology

## 2014-11-15 LAB — CYTOLOGY - PAP

## 2015-09-27 ENCOUNTER — Encounter: Payer: Self-pay | Admitting: Family Medicine

## 2015-10-02 ENCOUNTER — Encounter: Payer: Self-pay | Admitting: Family Medicine

## 2015-12-09 ENCOUNTER — Ambulatory Visit: Payer: Self-pay | Admitting: Family Medicine

## 2015-12-09 ENCOUNTER — Telehealth: Payer: Self-pay | Admitting: Family Medicine

## 2015-12-09 DIAGNOSIS — Z0289 Encounter for other administrative examinations: Secondary | ICD-10-CM

## 2015-12-09 NOTE — Telephone Encounter (Signed)
°  Reason for call: Patient left message on VM 4/3 @ 11:30 to reschedule appointment scheduled for 1:00. Attempted to contact patient at number left [phone]) but no answer and voicemail was full.

## 2015-12-11 ENCOUNTER — Telehealth: Payer: Self-pay | Admitting: Family Medicine

## 2015-12-11 NOTE — Telephone Encounter (Signed)
Error

## 2015-12-11 NOTE — Telephone Encounter (Signed)
1st no show. Last visit 04/08/14. Charge or no charge?

## 2015-12-11 NOTE — Telephone Encounter (Signed)
-----   Message from Darreld Mclean, MD sent at 12/11/2015 11:35 AM EDT ----- No charge this time- thanks

## 2016-01-21 ENCOUNTER — Other Ambulatory Visit: Payer: Self-pay | Admitting: Obstetrics and Gynecology

## 2016-01-22 LAB — CYTOLOGY - PAP

## 2016-02-10 ENCOUNTER — Encounter: Payer: Self-pay | Admitting: Gynecologic Oncology

## 2016-02-10 ENCOUNTER — Ambulatory Visit: Payer: BLUE CROSS/BLUE SHIELD | Attending: Gynecologic Oncology | Admitting: Gynecologic Oncology

## 2016-02-10 VITALS — BP 110/72 | HR 78 | Temp 97.8°F | Resp 18 | Ht 67.0 in | Wt 130.0 lb

## 2016-02-10 DIAGNOSIS — N921 Excessive and frequent menstruation with irregular cycle: Secondary | ICD-10-CM

## 2016-02-10 DIAGNOSIS — F17210 Nicotine dependence, cigarettes, uncomplicated: Secondary | ICD-10-CM | POA: Diagnosis not present

## 2016-02-10 DIAGNOSIS — D509 Iron deficiency anemia, unspecified: Secondary | ICD-10-CM | POA: Insufficient documentation

## 2016-02-10 DIAGNOSIS — N939 Abnormal uterine and vaginal bleeding, unspecified: Secondary | ICD-10-CM | POA: Insufficient documentation

## 2016-02-10 DIAGNOSIS — D500 Iron deficiency anemia secondary to blood loss (chronic): Secondary | ICD-10-CM

## 2016-02-10 DIAGNOSIS — D259 Leiomyoma of uterus, unspecified: Secondary | ICD-10-CM | POA: Diagnosis present

## 2016-02-10 DIAGNOSIS — D251 Intramural leiomyoma of uterus: Secondary | ICD-10-CM | POA: Insufficient documentation

## 2016-02-10 NOTE — Progress Notes (Signed)
Consult Note: Gyn-Onc  Consult was requested by Dr. Harrington Challenger for the evaluation of [name] 50 y.o. female  CC:  Chief Complaint  Patient presents with  . uterine leiomyoma , unspecified locatio    New consultation    Assessment/Plan:  [name]  is a 50 y.o.  year old with a 14cm fibroid uterus (rapidly enlarging fibroids - 2cm growth over 2 months) and menometorrhagia refractory to progestin therapy and associated with iron deficiency anemia.  I believe that hysterectomy is an appropriate next option for this patient who has significant mass effect symptoms as well as bleeding. I believe that there is a low probability for leiomyosarcoma, however, we will contain morcellation given her history of rapid fibroid growth. She declines lupron therapy due to side effects and is not a good candidate for [place] due to the rapid enlargment of fibroids and need for pathology.  We will follow-up the results of today's endometrial biopsy, however, I discussed that this does not rule out myometrial pathology.  I recommend robotic assisted total hysterectomy, bilateral salpingectomy, possible oophorectomy (if cancer is diagnosed).  I discussed risks including  bleeding, infection, damage to internal organs (such as bladder,ureters, bowels), blood clot, reoperation and rehospitalization. I discussed that these risks are elevated in cases of complex hysterectomy with enlarged fibroids.   HPI: [name] is a very pleasant 50 year old G2P2 who is seen in consultation at the request of Dr Mignon Pine for rapidly enlarging symptomatic fibroid uterus. The patient has had intermittent menometorrhagia associated with iron deficiency anemia for approximately 1 year. An ultrasound in the summer of 2016 was remarkable for uterine fibroids. Repeat US in March, 2017 showed growth in the fibroids to 12cm in largest dimension. Repeat US in May 2017 showed a uterus measuring 14.2x10.6x9cm.    The patient is thin  with no major medical problems. Her only 2 prior abdominal surgeries were cesarean sections.  Her fibroids are symptomatic with pelvic pressure, urinary frequency and heavy vaginal bleeding associated with anemia.  Current Meds:  Outpatient Encounter Prescriptions as of 02/10/2016  Medication Sig  . meclizine (ANTIVERT) 50 MG tablet Take 1 tablet (50 mg total) by mouth 3 (three) times daily as needed. (Patient not taking: Reported on 02/10/2016)  . promethazine (PHENERGAN) 12.5 MG tablet Take 12.5 mg by mouth as needed. Reported on 02/10/2016  . [DISCONTINUED] enoxaparin (LOVENOX) 100 MG/ML injection Inject 0.9 mLs (90 mg total) into the skin daily.  . [DISCONTINUED] fluticasone (FLONASE) 50 MCG/ACT nasal spray Place 2 sprays into both nostrils daily.  . [DISCONTINUED] hydrochlorothiazide (MICROZIDE) 12.5 MG capsule   . [DISCONTINUED] methocarbamol (ROBAXIN) 500 MG tablet Take 1 tablet (500 mg total) by mouth every 8 (eight) hours as needed for muscle spasms.  . [DISCONTINUED] warfarin (COUMADIN) 5 MG tablet Take 1 tablet (5 mg total) by mouth daily. Adjust as directed   No facility-administered encounter medications on file as of 02/10/2016.    Allergy: No Known Allergies  Social Hx:   Social History   Social History  . Marital Status: Married    Spouse Name: [name]   . Number of Children: 2  . Years of Education: 12+   Occupational History  . Not on file.   Social History Main Topics  . Smoking status: Current Every Day Smoker -- 0.25 packs/day    Types: Cigarettes  . Smokeless tobacco: Never Used  . Alcohol Use: No  . Drug Use: No  . Sexual Activity: Not on file  Other Topics Concern  . Not on file   Social History Narrative   Patient lives at home with husband and 2 children. [name]   Patient works at [employer].    Patient has a college education.    Patient is right handed.     Past Surgical Hx:  Past Surgical History  Procedure Laterality Date  . Cesarean section     . Tubal ligation      Past Medical Hx:  Past Medical History  Diagnosis Date  . Anemia     Past Gynecological History:  Fibroids, cesearan sections, tubal ligation  No LMP recorded.  Family Hx:  Family History  Problem Relation Age of Onset  . Diabetes Mother   . Diabetes Maternal Grandmother   . Heart disease Maternal Grandfather   . Tuberculosis Paternal Grandfather     Review of Systems:  Constitutional  Feels well,    ENT Normal appearing ears and nares bilaterally Skin/Breast  No rash, sores, jaundice, itching, dryness Cardiovascular  No chest pain, shortness of breath, or edema  Pulmonary  No cough or wheeze.  Gastro Intestinal  No nausea, vomitting, or diarrhoea. No bright red blood per rectum, no abdominal pain, change in bowel movement, or constipation.  Genito Urinary  No frequency, urgency, dysuria, + menorrhagia Musculo Skeletal  No myalgia, arthralgia, joint swelling or pain  Neurologic  No weakness, numbness, change in gait,  Psychology  No depression, anxiety, insomnia.   Vitals:  Blood pressure 110/72, pulse 78, temperature 97.8 F (36.6 C), temperature source Oral, resp. rate 18, height 5\' 7"  (1.702 m), weight 130 lb (58.968 kg), SpO2 100 %.  Physical Exam: WD in NAD Neck  Supple NROM, without any enlargements.  Lymph Node Survey No cervical supraclavicular or inguinal adenopathy Cardiovascular  Pulse normal rate, regularity and rhythm. S1 and S2 normal.  Lungs  Clear to auscultation bilateraly, without wheezes/crackles/rhonchi. Good air movement.  Skin  No rash/lesions/breakdown  Psychiatry  Alert and oriented to person, place, and time  Abdomen  Normoactive bowel sounds, abdomen soft, non-tender and thin without evidence of hernia. Mass palpable in midline 4 fingerbreadths above pubic symphysis. Back No CVA tenderness Genito Urinary  Vulva/vagina: Normal external female genitalia.  No lesions. No discharge or  bleeding.  Bladder/urethra:  No lesions or masses, well supported bladder  Vagina: normal  Cervix: Normal appearing, no lesions.  Uterus:  Bulky 16cm mobile with dominant anterior right uterine fibroid.  Adnexa: No disrete masses. Rectal  Good tone, no masses no cul de sac nodularity.  Extremities  No bilateral cyanosis, clubbing or edema.  Procedure Note:  Endometrial Biopsy  The procedure was explained.  The speculum was placed and the cervix was grasped with a tenaculum.  The endometrial biopsy was performed with 2 passes of the endometrial biopsy pipelle. Moderate tissue was obtained. The uterus sounded to 10 cm.  The patient tolerated procedure     Donaciano Eva, MD  02/10/2016, 12:18 PM

## 2016-02-10 NOTE — Addendum Note (Signed)
Addended by: Elizebeth Koller on: 02/10/2016 04:46 PM   Modules accepted: Orders

## 2016-02-10 NOTE — Patient Instructions (Addendum)
We will contact you with the results of your endometrial biopsy from today.               Preparing for your Surgery  Plan for surgery on June 27 with Dr. Everitt Amber at [facility] will be scheduled for a robotic assisted total hysterectomy, possible bilateral salpingo-oophorectomy.  Pre-operative Testing -You will receive a phone call from presurgical testing at [facility] to arrange for a pre-operative testing appointment before your surgery.  This appointment normally occurs one to two weeks before your scheduled surgery.   -Bring your insurance card, copy of an advanced directive if applicable, medication list  -At that visit, you will be asked to sign a consent for a possible blood transfusion in case a transfusion becomes necessary during surgery.  The need for a blood transfusion is rare but having consent is a necessary part of your care.     -You should not be taking blood thinners or aspirin at least ten days prior to surgery unless instructed by your surgeon.  Day Before Surgery at [place] will be asked to take in a light diet the day before surgery.  Avoid carbonated beverages.  You will be advised to have nothing to eat or drink after midnight the evening before.     Eat a light diet the day before surgery.  Examples including soups, broths, toast, yogurt, mashed potatoes.  Things to avoid include carbonated beverages (fizzy beverages), raw fruits and raw vegetables, or beans.    If your bowels are filled with gas, your surgeon will have difficulty visualizing your pelvic organs which increases your surgical risks.  Your role in recovery Your role is to become active as soon as directed by your doctor, while still giving yourself time to heal.  Rest when you feel tired. You will be asked to do the following in order to speed your recovery:  - Cough and breathe deeply. This helps toclear and expand your lungs and can prevent pneumonia. You may be given  a spirometer to practice deep breathing. A staff member will show you how to use the spirometer. - Do mild physical activity. Walking or moving your legs help your circulation and body functions return to normal. A staff member will help you when you try to walk and will provide you with simple exercises. Do not try to get up or walk alone the first time. - Actively manage your pain. Managing your pain lets you move in comfort. We will ask you to rate your pain on a scale of zero to 10. It is your responsibility to tell your doctor or nurse where and how much you hurt so your pain can be treated.  Special Considerations -If you are diabetic, you may be placed on insulin after surgery to have closer control over your blood sugars to promote healing and recovery.  This does not mean that you will be discharged on insulin.  If applicable, your oral antidiabetics will be resumed when you are tolerating a solid diet.  -Your final pathology results from surgery should be available by the Friday after surgery and the results will be relayed to you when available.  Blood Transfusion Information WHAT IS A BLOOD TRANSFUSION? A transfusion is the replacement of blood or some of its parts. Blood is made up of multiple cells which provide different functions.  Red blood cells carry oxygen and are used for blood loss replacement.  White blood cells fight against  infection.  Platelets control bleeding.  Plasma helps clot blood.  Other blood products are available for specialized needs, such as hemophilia or other clotting disorders. BEFORE THE TRANSFUSION  Who gives blood for transfusions?   You may be able to donate blood to be used at a later date on yourself (autologous donation).  Relatives can be asked to donate blood. This is generally not any safer than if you have received blood from a stranger. The same precautions are taken to ensure safety when a relative's blood is donated.  Healthy  volunteers who are fully evaluated to make sure their blood is safe. This is blood bank blood. Transfusion therapy is the safest it has ever been in the practice of medicine. Before blood is taken from a donor, a complete history is taken to make sure that person has no history of diseases nor engages in risky social behavior (examples are intravenous drug use or sexual activity with multiple partners). The donor's travel history is screened to minimize risk of transmitting infections, such as malaria. The donated blood is tested for signs of infectious diseases, such as HIV and hepatitis. The blood is then tested to be sure it is compatible with you in order to minimize the chance of a transfusion reaction. If you or a relative donates blood, this is often done in anticipation of surgery and is not appropriate for emergency situations. It takes many days to process the donated blood. RISKS AND COMPLICATIONS Although transfusion therapy is very safe and saves many lives, the main dangers of transfusion include:   Getting an infectious disease.  Developing a transfusion reaction. This is an allergic reaction to something in the blood you were given. Every precaution is taken to prevent this. The decision to have a blood transfusion has been considered carefully by your caregiver before blood is given. Blood is not given unless the benefits outweigh the risks.  Endometrial Biopsy, Care After Refer to this sheet in the next few weeks. These instructions provide you with information on caring for yourself after your procedure. Your health care provider may also give you more specific instructions. Your treatment has been planned according to current medical practices, but problems sometimes occur. Call your health care provider if you have any problems or questions after your procedure. WHAT TO EXPECT AFTER THE PROCEDURE After your procedure, it is typical to have the following:  You may have mild  cramping and a small amount of vaginal bleeding for a few days after the procedure. This is normal. HOME CARE INSTRUCTIONS  Only take over-the-counter or prescription medicine as directed by your health care provider.  Do not douche, use tampons, or have sexual intercourse until your health care provider approves.  Follow your health care provider's instructions regarding any activity restrictions, such as strenuous exercise or heavy lifting. SEEK MEDICAL CARE IF:  You have heavy bleeding or bleeding longer than 2 days after the procedure.  You have bad smelling drainage from your vagina.  You have a fever and chills.  Youhave severe lower stomach (abdominal) pain. SEEK IMMEDIATE MEDICAL CARE IF:  You have severe cramps in your stomach or back.  You pass large blood clots.  Your bleeding increases.  You become weak or lightheaded, or you pass out.   This information is not intended to replace advice given to you by your health care provider. Make sure you discuss any questions you have with your health care provider.   Document Released: 06/14/2013 Document Reviewed: 06/14/2013  Chartered certified accountant Patient Education [employer].

## 2016-02-25 NOTE — Patient Instructions (Signed)
[name]  02/25/2016   Your procedure is scheduled on: 03-03-16  Report to [facility] Main  Entrance take [facility]  elevators to 3rd floor to  [place] at 530 AM.  Call this number if you have problems the morning of surgery [phone]   Remember: ONLY 1 PERSON MAY GO WITH YOU TO SHORT STAY TO GET  READY MORNING OF [name].  Do not eat food or drink liquids :After Midnight. Light diet day before surgery 03-02-16, see instructions below     Take these medicines the morning of surgery with A SIP OF WATER: none              You may not have any metal on your body including hair pins and              piercings  Do not wear jewelry, make-up, lotions, powders or perfumes, deodorant             Do not wear nail polish.  Do not shave  48 hours prior to surgery.              Men may shave face and neck.   Do not bring valuables to the hospital. [name].  Contacts, dentures or bridgework may not be worn into surgery.  Leave suitcase in the car. After surgery it may be brought to your room.   _____________________________________________________________________             Eat a light diet the day before surgery.  Examples including soups, broths, toast, yogurt, mashed potatoes.  Things to avoid include carbonated beverages (fizzy beverages), raw fruits and raw vegetables, or beans.   If your bowels are filled with gas, your surgeon will have difficulty visualizing your pelvic organs which increases your surgical risks.[name] - Preparing for Surgery Before surgery, you can play an important role.  Because skin is not sterile, your skin needs to be as free of germs as possible.  You can reduce the number of germs on your skin by washing with CHG (chlorahexidine gluconate) soap before surgery.  CHG is an antiseptic cleaner which kills germs and bonds with the skin to continue killing germs even after  washing. Please DO NOT use if you have an allergy to CHG or antibacterial soaps.  If your skin becomes reddened/irritated stop using the CHG and inform your nurse when you arrive at Short Stay. Do not shave (including legs and underarms) for at least 48 hours prior to the first CHG shower.  You may shave your face/neck. Please follow these instructions carefully:  1.  Shower with CHG Soap the night before surgery and the  morning of Surgery.  2.  If you choose to wash your hair, wash your hair first as usual with your  normal  shampoo.  3.  After you shampoo, rinse your hair and body thoroughly to remove the  shampoo.                           4.  Use CHG as you would any other liquid soap.  You can apply chg directly  to the skin and wash  Gently with a scrungie or clean washcloth.  5.  Apply the CHG Soap to your body ONLY FROM THE NECK DOWN.   Do not use on face/ open                           Wound or open sores. Avoid contact with eyes, ears mouth and genitals (private parts).                       Wash face,  Genitals (private parts) with your normal soap.             6.  Wash thoroughly, paying special attention to the area where your surgery  will be performed.  7.  Thoroughly rinse your body with warm water from the neck down.  8.  DO NOT shower/wash with your normal soap after using and rinsing off  the CHG Soap.                9.  Pat yourself dry with a clean towel.            10.  Wear clean pajamas.            11.  Place clean sheets on your bed the night of your first shower and do not  sleep with pets. Day of Surgery : Do not apply any lotions/deodorants the morning of surgery.  Please wear clean clothes to the hospital/surgery center.  FAILURE TO FOLLOW THESE INSTRUCTIONS MAY RESULT IN THE CANCELLATION OF YOUR SURGERY PATIENT SIGNATURE_________________________________  NURSE  SIGNATURE__________________________________  ________________________________________________________________________   [name]  An incentive spirometer is a tool that can help keep your lungs clear and active. This tool measures how well you are filling your lungs with each breath. Taking long deep breaths may help reverse or decrease the chance of developing breathing (pulmonary) problems (especially infection) following:  A long period of time when you are unable to move or be active. BEFORE THE PROCEDURE   If the spirometer includes an indicator to show your best effort, your nurse or respiratory therapist will set it to a desired goal.  If possible, sit up straight or lean slightly forward. Try not to slouch.  Hold the incentive spirometer in an upright position. INSTRUCTIONS FOR USE   Sit on the edge of your bed if possible, or sit up as far as you can in bed or on a chair.  Hold the incentive spirometer in an upright position.  Breathe out normally.  Place the mouthpiece in your mouth and seal your lips tightly around it.  Breathe in slowly and as deeply as possible, raising the piston or the ball toward the top of the column.  Hold your breath for 3-5 seconds or for as long as possible. Allow the piston or ball to fall to the bottom of the column.  Remove the mouthpiece from your mouth and breathe out normally.  Rest for a few seconds and repeat Steps 1 through 7 at least 10 times every 1-2 hours when you are awake. Take your time and take a few normal breaths between deep breaths.  The spirometer may include an indicator to show your best effort. Use the indicator as a goal to work toward during each repetition.  After each set of 10 deep breaths, practice coughing to be sure your lungs are clear. If you have an incision (the cut made at the time of surgery),  support your incision when coughing by placing a pillow or rolled up towels firmly against it. Once  you are able to get out of bed, walk around indoors and cough well. You may stop using the incentive spirometer when instructed by your caregiver.  RISKS AND COMPLICATIONS  Take your time so you do not get dizzy or light-headed.  If you are in pain, you may need to take or ask for pain medication before doing incentive spirometry. It is harder to take a deep breath if you are having pain. AFTER USE  Rest and breathe slowly and easily.  It can be helpful to keep track of a log of your progress. Your caregiver can provide you with a simple table to help with this. If you are using the spirometer at home, follow these instructions: [place] IF:   You are having difficultly using the spirometer.  You have trouble using the spirometer as often as instructed.  Your pain medication is not giving enough relief while using the spirometer.  You develop fever of 100.5 F (38.1 C) or higher. SEEK IMMEDIATE MEDICAL CARE IF:   You cough up bloody sputum that had not been present before.  You develop fever of 102 F (38.9 C) or greater.  You develop worsening pain at or near the incision site. MAKE SURE YOU:   Understand these instructions.  Will watch your condition.  Will get help right away if you are not doing well or get worse. Document Released: 01/04/2007 Document Revised: 11/16/2011 Document Reviewed: 03/07/2007 ExitCare Patient Information 2014 Exit[employer], [place].   ________________________________________________________________________  WHAT IS A BLOOD TRANSFUSION? Blood Transfusion Information  A transfusion is the replacement of blood or some of its parts. Blood is made up of multiple cells which provide different functions.  Red blood cells carry oxygen and are used for blood loss replacement.  White blood cells fight against infection.  Platelets control bleeding.  Plasma helps clot blood.  Other blood products are available for specialized needs, such as  hemophilia or other clotting disorders. BEFORE THE TRANSFUSION  Who gives blood for transfusions?   Healthy volunteers who are fully evaluated to make sure their blood is safe. This is blood bank blood. Transfusion therapy is the safest it has ever been in the practice of medicine. Before blood is taken from a donor, a complete history is taken to make sure that person has no history of diseases nor engages in risky social behavior (examples are intravenous drug use or sexual activity with multiple partners). The donor's travel history is screened to minimize risk of transmitting infections, such as malaria. The donated blood is tested for signs of infectious diseases, such as HIV and hepatitis. The blood is then tested to be sure it is compatible with you in order to minimize the chance of a transfusion reaction. If you or a relative donates blood, this is often done in anticipation of surgery and is not appropriate for emergency situations. It takes many days to process the donated blood. RISKS AND COMPLICATIONS Although transfusion therapy is very safe and saves many lives, the main dangers of transfusion include:   Getting an infectious disease.  Developing a transfusion reaction. This is an allergic reaction to something in the blood you were given. Every precaution is taken to prevent this. The decision to have a blood transfusion has been considered carefully by your caregiver before blood is given. Blood is not given unless the benefits outweigh the risks. AFTER THE TRANSFUSION  Right after receiving a blood transfusion, you will usually feel much better and more energetic. This is especially true if your red blood cells have gotten low (anemic). The transfusion raises the level of the red blood cells which carry oxygen, and this usually causes an energy increase.  The nurse administering the transfusion will monitor you carefully for complications. HOME CARE INSTRUCTIONS  No special  instructions are needed after a transfusion. You may find your energy is better. Speak with your caregiver about any limitations on activity for underlying diseases you may have. SEEK MEDICAL CARE IF:   Your condition is not improving after your transfusion.  You develop redness or irritation at the intravenous (IV) site. SEEK IMMEDIATE MEDICAL CARE IF:  Any of the following symptoms occur over the next 12 hours:  Shaking chills.  You have a temperature by mouth above 102 F (38.9 C), not controlled by medicine.  Chest, back, or muscle pain.  People around you feel you are not acting correctly or are confused.  Shortness of breath or difficulty breathing.  Dizziness and fainting.  You get a rash or develop hives.  You have a decrease in urine output.  Your urine turns a dark color or changes to pink, red, or brown. Any of the following symptoms occur over the next 10 days:  You have a temperature by mouth above 102 F (38.9 C), not controlled by medicine.  Shortness of breath.  Weakness after normal activity.  The white part of the eye turns yellow (jaundice).  You have a decrease in the amount of urine or are urinating less often.  Your urine turns a dark color or changes to pink, red, or brown. Document Released: 08/21/2000 Document Revised: 11/16/2011 Document Reviewed: 04/09/2008 [facility] Patient Information 2014 [place], [place].  _______________________________________________________________________

## 2016-02-26 ENCOUNTER — Encounter (HOSPITAL_COMMUNITY)
Admission: RE | Admit: 2016-02-26 | Discharge: 2016-02-26 | Disposition: A | Discharge status: Home / Self Care | Payer: BLUE CROSS/BLUE SHIELD | Source: Ambulatory Visit | Attending: Gynecologic Oncology | Admitting: Gynecologic Oncology

## 2016-02-26 ENCOUNTER — Encounter (HOSPITAL_COMMUNITY): Payer: Self-pay

## 2016-02-26 DIAGNOSIS — Z01812 Encounter for preprocedural laboratory examination: Secondary | ICD-10-CM | POA: Diagnosis present

## 2016-02-26 DIAGNOSIS — Z0183 Encounter for blood typing: Secondary | ICD-10-CM | POA: Diagnosis not present

## 2016-02-26 DIAGNOSIS — D259 Leiomyoma of uterus, unspecified: Secondary | ICD-10-CM | POA: Diagnosis not present

## 2016-02-26 HISTORY — DX: Leiomyoma of uterus, unspecified: D25.9

## 2016-02-26 HISTORY — DX: Acute embolism and thrombosis of unspecified deep veins of left lower extremity: I82.402

## 2016-02-26 LAB — PREGNANCY, URINE: PREG TEST UR: NEGATIVE

## 2016-02-26 LAB — TYPE AND SCREEN
ABO/RH(D): A POS
ANTIBODY SCREEN: POSITIVE
DAT, IgG: NEGATIVE
PT AG Type: NEGATIVE

## 2016-02-26 LAB — COMPREHENSIVE METABOLIC PANEL
ALT: 10 U/L — ABNORMAL LOW (ref 14–54)
AST: 17 U/L (ref 15–41)
Albumin: 3.9 g/dL (ref 3.5–5.0)
Alkaline Phosphatase: 40 U/L (ref 38–126)
Anion gap: 5 (ref 5–15)
BUN: 14 mg/dL (ref 6–20)
CHLORIDE: 108 mmol/L (ref 101–111)
CO2: 24 mmol/L (ref 22–32)
Calcium: 8.9 mg/dL (ref 8.9–10.3)
Creatinine, Ser: 0.85 mg/dL (ref 0.44–1.00)
Glucose, Bld: 77 mg/dL (ref 65–99)
POTASSIUM: 4 mmol/L (ref 3.5–5.1)
Sodium: 137 mmol/L (ref 135–145)
Total Bilirubin: 0.7 mg/dL (ref 0.3–1.2)
Total Protein: 7.7 g/dL (ref 6.5–8.1)

## 2016-02-26 LAB — URINALYSIS, ROUTINE W REFLEX MICROSCOPIC
Bilirubin Urine: NEGATIVE
GLUCOSE, UA: NEGATIVE mg/dL
Hgb urine dipstick: NEGATIVE
Ketones, ur: NEGATIVE mg/dL
LEUKOCYTES UA: NEGATIVE
Nitrite: NEGATIVE
PROTEIN: NEGATIVE mg/dL
Specific Gravity, Urine: 1.021 (ref 1.005–1.030)
pH: 6 (ref 5.0–8.0)

## 2016-02-26 LAB — ABO/RH: ABO/RH(D): A POS

## 2016-02-26 LAB — CBC WITH DIFFERENTIAL/PLATELET
BASOS PCT: 0 %
Basophils Absolute: 0 10*3/uL (ref 0.0–0.1)
EOS ABS: 0.4 10*3/uL (ref 0.0–0.7)
Eosinophils Relative: 12 %
HCT: 33.6 % — ABNORMAL LOW (ref 36.0–46.0)
Hemoglobin: 10.4 g/dL — ABNORMAL LOW (ref 12.0–15.0)
Lymphocytes Relative: 24 %
Lymphs Abs: 0.8 10*3/uL (ref 0.7–4.0)
MCH: 24.4 pg — AB (ref 26.0–34.0)
MCHC: 31 g/dL (ref 30.0–36.0)
MCV: 78.9 fL (ref 78.0–100.0)
MONO ABS: 0.3 10*3/uL (ref 0.1–1.0)
Monocytes Relative: 9 %
NEUTROS PCT: 55 %
Neutro Abs: 1.9 10*3/uL (ref 1.7–7.7)
PLATELETS: 314 10*3/uL (ref 150–400)
RBC: 4.26 MIL/uL (ref 3.87–5.11)
RDW: 28.3 % — AB (ref 11.5–15.5)
WBC: 3.4 10*3/uL — ABNORMAL LOW (ref 4.0–10.5)

## 2016-03-02 NOTE — Anesthesia Preprocedure Evaluation (Addendum)
Anesthesia Evaluation  Patient identified by MRN, date of birth, ID band Patient awake    Reviewed: Allergy & Precautions, NPO status , Patient's Chart, lab work & pertinent test results  Airway Mallampati: I  TM Distance: >3 FB Neck ROM: Full    Dental  (+) Dental Advisory Given   Pulmonary Current Smoker,    breath sounds clear to auscultation       Cardiovascular + Peripheral Vascular Disease (Hx DVT)   Rhythm:Regular Rate:Normal     Neuro/Psych negative neurological ROS     GI/Hepatic negative GI ROS, Neg liver ROS,   Endo/Other  negative endocrine ROS  Renal/GU negative Renal ROS     Musculoskeletal   Abdominal   Peds  Hematology  (+) anemia ,   Anesthesia Other Findings   Reproductive/Obstetrics                            Lab Results  Component Value Date   WBC 3.4* 02/26/2016   HGB 10.4* 02/26/2016   HCT 33.6* 02/26/2016   MCV 78.9 02/26/2016   PLT 314 02/26/2016   Lab Results  Component Value Date   CREATININE 0.85 02/26/2016   BUN 14 02/26/2016   NA 137 02/26/2016   K 4.0 02/26/2016   CL 108 02/26/2016   CO2 24 02/26/2016    Anesthesia Physical Anesthesia Plan  ASA: II  Anesthesia Plan: General   Post-op Pain Management:    Induction: Intravenous  Airway Management Planned: Oral ETT  Additional Equipment:   Intra-op Plan:   Post-operative Plan: Extubation in OR  Informed Consent: I have reviewed the patients History and Physical, chart, labs and discussed the procedure including the risks, benefits and alternatives for the proposed anesthesia with the patient or authorized representative who has indicated his/her understanding and acceptance.   Dental advisory given  Plan Discussed with: CRNA  Anesthesia Plan Comments:        Anesthesia Quick Evaluation

## 2016-03-03 ENCOUNTER — Ambulatory Visit (HOSPITAL_COMMUNITY): Payer: BLUE CROSS/BLUE SHIELD | Admitting: Anesthesiology

## 2016-03-03 ENCOUNTER — Encounter (HOSPITAL_COMMUNITY): Payer: Self-pay

## 2016-03-03 ENCOUNTER — Ambulatory Visit (HOSPITAL_COMMUNITY)
Admission: RE | Admit: 2016-03-03 | Discharge: 2016-03-04 | Disposition: A | Discharge status: Home / Self Care | Payer: BLUE CROSS/BLUE SHIELD | Source: Ambulatory Visit | Attending: Gynecologic Oncology | Admitting: Gynecologic Oncology

## 2016-03-03 ENCOUNTER — Encounter (HOSPITAL_COMMUNITY)
Admission: RE | Disposition: A | Discharge status: Home / Self Care | Payer: Self-pay | Source: Ambulatory Visit | Attending: Gynecologic Oncology

## 2016-03-03 DIAGNOSIS — Z86718 Personal history of other venous thrombosis and embolism: Secondary | ICD-10-CM | POA: Diagnosis not present

## 2016-03-03 DIAGNOSIS — D251 Intramural leiomyoma of uterus: Secondary | ICD-10-CM | POA: Diagnosis present

## 2016-03-03 DIAGNOSIS — I739 Peripheral vascular disease, unspecified: Secondary | ICD-10-CM | POA: Insufficient documentation

## 2016-03-03 DIAGNOSIS — N920 Excessive and frequent menstruation with regular cycle: Secondary | ICD-10-CM | POA: Insufficient documentation

## 2016-03-03 DIAGNOSIS — I82409 Acute embolism and thrombosis of unspecified deep veins of unspecified lower extremity: Secondary | ICD-10-CM

## 2016-03-03 DIAGNOSIS — F17210 Nicotine dependence, cigarettes, uncomplicated: Secondary | ICD-10-CM | POA: Insufficient documentation

## 2016-03-03 DIAGNOSIS — D509 Iron deficiency anemia, unspecified: Secondary | ICD-10-CM | POA: Insufficient documentation

## 2016-03-03 DIAGNOSIS — D259 Leiomyoma of uterus, unspecified: Secondary | ICD-10-CM | POA: Diagnosis not present

## 2016-03-03 HISTORY — PX: ROBOTIC ASSISTED TOTAL HYSTERECTOMY WITH BILATERAL SALPINGO OOPHERECTOMY: SHX6086

## 2016-03-03 SURGERY — HYSTERECTOMY, TOTAL, ROBOT-ASSISTED, LAPAROSCOPIC, WITH BILATERAL SALPINGO-OOPHORECTOMY
Anesthesia: General

## 2016-03-03 MED ORDER — LIDOCAINE HCL (CARDIAC) 20 MG/ML IV SOLN
INTRAVENOUS | Status: AC
  Filled 2016-03-03: qty 5

## 2016-03-03 MED ORDER — ONDANSETRON HCL 4 MG/2ML IJ SOLN: 4.0000 mg | Freq: Four times a day (QID) | INTRAMUSCULAR | Status: DC | PRN

## 2016-03-03 MED ORDER — PROPOFOL 10 MG/ML IV BOLUS
INTRAVENOUS | Status: AC
  Filled 2016-03-03: qty 20

## 2016-03-03 MED ORDER — STERILE WATER FOR IRRIGATION IR SOLN
Status: DC | PRN
  Administered 2016-03-03: 1000 mL

## 2016-03-03 MED ORDER — FENTANYL CITRATE (PF) 250 MCG/5ML IJ SOLN
INTRAMUSCULAR | Status: AC
  Filled 2016-03-03: qty 5

## 2016-03-03 MED ORDER — FENTANYL CITRATE (PF) 100 MCG/2ML IJ SOLN
INTRAMUSCULAR | Status: DC | PRN
  Administered 2016-03-03: 25 ug via INTRAVENOUS
  Administered 2016-03-03: 50 ug via INTRAVENOUS
  Administered 2016-03-03: 25 ug via INTRAVENOUS
  Administered 2016-03-03 (×2): 50 ug via INTRAVENOUS

## 2016-03-03 MED ORDER — DICLOFENAC SODIUM 50 MG PO TBEC
50.0000 mg | DELAYED_RELEASE_TABLET | Freq: Three times a day (TID) | ORAL | Status: DC
  Administered 2016-03-03 – 2016-03-04 (×3): 50 mg via ORAL
  Filled 2016-03-03 (×4): qty 1

## 2016-03-03 MED ORDER — KETOROLAC TROMETHAMINE 30 MG/ML IJ SOLN
INTRAMUSCULAR | Status: AC
  Filled 2016-03-03: qty 1

## 2016-03-03 MED ORDER — KCL IN DEXTROSE-NACL 20-5-0.45 MEQ/L-%-% IV SOLN
INTRAVENOUS | Status: DC
Start: 2016-03-03 — End: 2016-03-04
  Administered 2016-03-03: 50 mL/h via INTRAVENOUS
  Filled 2016-03-03 (×2): qty 1000

## 2016-03-03 MED ORDER — HYDROMORPHONE HCL 1 MG/ML IJ SOLN
INTRAMUSCULAR | Status: AC
  Filled 2016-03-03: qty 1

## 2016-03-03 MED ORDER — STERILE WATER FOR INJECTION IJ SOLN
INTRAMUSCULAR | Status: AC
  Filled 2016-03-03: qty 10

## 2016-03-03 MED ORDER — SUCCINYLCHOLINE CHLORIDE 20 MG/ML IJ SOLN
INTRAMUSCULAR | Status: DC | PRN
  Administered 2016-03-03: 100 mg via INTRAVENOUS

## 2016-03-03 MED ORDER — EPHEDRINE SULFATE 50 MG/ML IJ SOLN
INTRAMUSCULAR | Status: DC | PRN
  Administered 2016-03-03: 10 mg via INTRAVENOUS

## 2016-03-03 MED ORDER — GABAPENTIN 300 MG PO CAPS
600.0000 mg | ORAL_CAPSULE | Freq: Every day | ORAL | Status: AC
  Administered 2016-03-03: 600 mg via ORAL
  Filled 2016-03-03: qty 2

## 2016-03-03 MED ORDER — LACTATED RINGERS IR SOLN
Status: DC | PRN
  Administered 2016-03-03: 1000 mL

## 2016-03-03 MED ORDER — HYDROCODONE-ACETAMINOPHEN 7.5-325 MG PO TABS: 1.0000 | ORAL_TABLET | Freq: Once | ORAL | Status: DC | PRN

## 2016-03-03 MED ORDER — SUGAMMADEX SODIUM 200 MG/2ML IV SOLN
INTRAVENOUS | Status: AC
  Filled 2016-03-03: qty 2

## 2016-03-03 MED ORDER — MIDAZOLAM HCL 5 MG/5ML IJ SOLN
INTRAMUSCULAR | Status: DC | PRN
  Administered 2016-03-03: 2 mg via INTRAVENOUS

## 2016-03-03 MED ORDER — LACTATED RINGERS IV SOLN
INTRAVENOUS | Status: DC | PRN
  Administered 2016-03-03 (×2): via INTRAVENOUS

## 2016-03-03 MED ORDER — ENOXAPARIN SODIUM 40 MG/0.4ML ~~LOC~~ SOLN
40.0000 mg | SUBCUTANEOUS | Status: AC
  Administered 2016-03-03: 40 mg via SUBCUTANEOUS
  Filled 2016-03-03: qty 0.4

## 2016-03-03 MED ORDER — ENOXAPARIN SODIUM 40 MG/0.4ML ~~LOC~~ SOLN
40.0000 mg | SUBCUTANEOUS | Status: DC
  Administered 2016-03-04: 40 mg via SUBCUTANEOUS
  Filled 2016-03-03: qty 0.4

## 2016-03-03 MED ORDER — MENTHOL 3 MG MT LOZG
LOZENGE | OROMUCOSAL | Status: AC
  Filled 2016-03-03: qty 9

## 2016-03-03 MED ORDER — HYDROMORPHONE HCL 1 MG/ML IJ SOLN
0.2500 mg | INTRAMUSCULAR | Status: DC | PRN
  Administered 2016-03-03 (×2): 0.5 mg via INTRAVENOUS

## 2016-03-03 MED ORDER — PROPOFOL 10 MG/ML IV BOLUS
INTRAVENOUS | Status: DC | PRN
  Administered 2016-03-03: 130 mg via INTRAVENOUS

## 2016-03-03 MED ORDER — ONDANSETRON HCL 4 MG/2ML IJ SOLN
INTRAMUSCULAR | Status: AC
  Filled 2016-03-03: qty 2

## 2016-03-03 MED ORDER — DEXAMETHASONE SODIUM PHOSPHATE 10 MG/ML IJ SOLN
INTRAMUSCULAR | Status: AC
  Filled 2016-03-03: qty 1

## 2016-03-03 MED ORDER — DEXAMETHASONE SODIUM PHOSPHATE 10 MG/ML IJ SOLN
INTRAMUSCULAR | Status: DC | PRN
  Administered 2016-03-03: 10 mg via INTRAVENOUS

## 2016-03-03 MED ORDER — SODIUM CHLORIDE 0.9 % IJ SOLN
INTRAMUSCULAR | Status: AC
  Filled 2016-03-03: qty 10

## 2016-03-03 MED ORDER — KETOROLAC TROMETHAMINE 30 MG/ML IJ SOLN
30.0000 mg | Freq: Once | INTRAMUSCULAR | Status: AC
  Administered 2016-03-03: 30 mg via INTRAVENOUS

## 2016-03-03 MED ORDER — ONDANSETRON HCL 4 MG PO TABS: 4.0000 mg | ORAL_TABLET | Freq: Four times a day (QID) | ORAL | Status: DC | PRN

## 2016-03-03 MED ORDER — ACETAMINOPHEN 325 MG PO TABS: 650.0000 mg | ORAL_TABLET | Freq: Four times a day (QID) | ORAL | Status: DC | PRN

## 2016-03-03 MED ORDER — ROCURONIUM BROMIDE 100 MG/10ML IV SOLN
INTRAVENOUS | Status: AC
  Filled 2016-03-03: qty 1

## 2016-03-03 MED ORDER — ONDANSETRON HCL 4 MG/2ML IJ SOLN
INTRAMUSCULAR | Status: DC | PRN
  Administered 2016-03-03: 4 mg via INTRAVENOUS

## 2016-03-03 MED ORDER — PROMETHAZINE HCL 25 MG/ML IJ SOLN: 6.2500 mg | INTRAMUSCULAR | Status: DC | PRN

## 2016-03-03 MED ORDER — CEFAZOLIN SODIUM-DEXTROSE 2-4 GM/100ML-% IV SOLN
2.0000 g | INTRAVENOUS | Status: AC
  Administered 2016-03-03: 2 g via INTRAVENOUS

## 2016-03-03 MED ORDER — EPHEDRINE SULFATE 50 MG/ML IJ SOLN
INTRAMUSCULAR | Status: AC
  Filled 2016-03-03: qty 1

## 2016-03-03 MED ORDER — MIDAZOLAM HCL 2 MG/2ML IJ SOLN
INTRAMUSCULAR | Status: AC
  Filled 2016-03-03: qty 2

## 2016-03-03 MED ORDER — SUGAMMADEX SODIUM 200 MG/2ML IV SOLN
INTRAVENOUS | Status: DC | PRN
  Administered 2016-03-03: 130 mg via INTRAVENOUS

## 2016-03-03 MED ORDER — ROCURONIUM BROMIDE 100 MG/10ML IV SOLN
INTRAVENOUS | Status: DC | PRN
  Administered 2016-03-03: 40 mg via INTRAVENOUS
  Administered 2016-03-03: 10 mg via INTRAVENOUS

## 2016-03-03 MED ORDER — LIDOCAINE HCL (CARDIAC) 20 MG/ML IV SOLN
INTRAVENOUS | Status: DC | PRN
  Administered 2016-03-03: 100 mg via INTRAVENOUS

## 2016-03-03 MED ORDER — OXYCODONE-ACETAMINOPHEN 5-325 MG PO TABS
1.0000 | ORAL_TABLET | ORAL | Status: DC | PRN
  Administered 2016-03-03 – 2016-03-04 (×3): 1 via ORAL
  Filled 2016-03-03 (×3): qty 1

## 2016-03-03 MED ORDER — CEFAZOLIN SODIUM-DEXTROSE 2-4 GM/100ML-% IV SOLN
INTRAVENOUS | Status: AC
  Filled 2016-03-03: qty 100

## 2016-03-03 MED ORDER — HYDROMORPHONE HCL 1 MG/ML IJ SOLN: 0.2000 mg | INTRAMUSCULAR | Status: DC | PRN

## 2016-03-03 SURGICAL SUPPLY — 56 items
APPLICATOR SURGIFLO ENDO (HEMOSTASIS) IMPLANT
BLADE 10 SAFETY STRL DISP (BLADE) ×4 IMPLANT
CHLORAPREP W/TINT 26ML (MISCELLANEOUS) ×2 IMPLANT
COVER SURGICAL LIGHT HANDLE (MISCELLANEOUS) ×2 IMPLANT
COVER TIP SHEARS 8 DVNC (MISCELLANEOUS) ×1 IMPLANT
COVER TIP SHEARS 8MM DA VINCI (MISCELLANEOUS) ×1
DRAPE ARM DVNC X/XI (DISPOSABLE) ×4 IMPLANT
DRAPE COLUMN DVNC XI (DISPOSABLE) ×1 IMPLANT
DRAPE DA VINCI XI ARM (DISPOSABLE) ×4
DRAPE DA VINCI XI COLUMN (DISPOSABLE) ×1
DRAPE SHEET LG 3/4 BI-LAMINATE (DRAPES) ×4 IMPLANT
DRAPE SURG IRRIG POUCH 19X23 (DRAPES) ×2 IMPLANT
ELECT REM PT RETURN 15FT ADLT (MISCELLANEOUS) ×2 IMPLANT
GLOVE BIO SURGEON STRL SZ 6 (GLOVE) ×10 IMPLANT
GLOVE BIO SURGEON STRL SZ 6.5 (GLOVE) ×6 IMPLANT
GOWN STRL REUS W/ TWL LRG LVL3 (GOWN DISPOSABLE) ×7 IMPLANT
GOWN STRL REUS W/TWL LRG LVL3 (GOWN DISPOSABLE) ×7
HOLDER FOLEY CATH W/STRAP (MISCELLANEOUS) ×2 IMPLANT
KIT BASIN OR (CUSTOM PROCEDURE TRAY) ×2 IMPLANT
KIT PROCEDURE DA VINCI SI (MISCELLANEOUS)
KIT PROCEDURE DVNC SI (MISCELLANEOUS) IMPLANT
LIQUID BAND (GAUZE/BANDAGES/DRESSINGS) ×2 IMPLANT
MANIPULATOR UTERINE 4.5 ZUMI (MISCELLANEOUS) ×2 IMPLANT
MARKER SKIN DUAL TIP RULER LAB (MISCELLANEOUS) ×2 IMPLANT
NDL SAFETY ECLIPSE 18X1.5 (NEEDLE) ×1 IMPLANT
NEEDLE HYPO 18GX1.5 SHARP (NEEDLE) ×1
NEEDLE SPNL 18GX3.5 QUINCKE PK (NEEDLE) IMPLANT
OBTURATOR XI 8MM BLADELESS (TROCAR) ×2 IMPLANT
OCCLUDER COLPOPNEUMO (BALLOONS) ×2 IMPLANT
PAD POSITIONING PINK XL (MISCELLANEOUS) ×2 IMPLANT
PORT ACCESS TROCAR AIRSEAL 12 (TROCAR) ×1 IMPLANT
PORT ACCESS TROCAR AIRSEAL 5M (TROCAR) ×1
POUCH ENDO CATCH II 15MM (MISCELLANEOUS) ×2 IMPLANT
POUCH SPECIMEN RETRIEVAL 10MM (ENDOMECHANICALS) IMPLANT
SEAL CANN UNIV 5-8 DVNC XI (MISCELLANEOUS) ×4 IMPLANT
SEAL XI 5MM-8MM UNIVERSAL (MISCELLANEOUS) ×4
SET TRI-LUMEN FLTR TB AIRSEAL (TUBING) ×2 IMPLANT
SET TUBE IRRIG SUCTION NO TIP (IRRIGATION / IRRIGATOR) ×2 IMPLANT
SHEET LAVH (DRAPES) ×2 IMPLANT
SOLUTION ELECTROLUBE (MISCELLANEOUS) ×2 IMPLANT
SURGIFLO W/THROMBIN 8M KIT (HEMOSTASIS) IMPLANT
SUT MNCRL AB 4-0 PS2 18 (SUTURE) ×4 IMPLANT
SUT VIC AB 0 CT1 27 (SUTURE) ×2
SUT VIC AB 0 CT1 27XBRD ANTBC (SUTURE) ×2 IMPLANT
SYR 50ML LL SCALE MARK (SYRINGE) IMPLANT
SYRINGE 10CC LL (SYRINGE) IMPLANT
TOWEL OR 17X26 10 PK STRL BLUE (TOWEL DISPOSABLE) ×2 IMPLANT
TOWEL OR NON WOVEN STRL DISP B (DISPOSABLE) ×2 IMPLANT
TRAP SPECIMEN MUCOUS 40CC (MISCELLANEOUS) IMPLANT
TRAY FOLEY W/METER SILVER 14FR (SET/KITS/TRAYS/PACK) ×2 IMPLANT
TRAY LAPAROSCOPIC (CUSTOM PROCEDURE TRAY) ×2 IMPLANT
TROCAR BLADELESS OPT 5 100 (ENDOMECHANICALS) ×2 IMPLANT
UNDERPAD 30X30 (UNDERPADS AND DIAPERS) ×2 IMPLANT
UNDERPAD 30X30 INCONTINENT (UNDERPADS AND DIAPERS) ×2 IMPLANT
WATER STERILE IRR 1000ML POUR (IV SOLUTION) ×2 IMPLANT
YANKAUER SUCT BULB TIP 10FT TU (MISCELLANEOUS) ×2 IMPLANT

## 2016-03-03 NOTE — H&P (View-Only) (Signed)
Consult Note: Gyn-Onc  Consult was requested by Dr. Harrington Challenger for the evaluation of [name] 50 y.o. female  CC:  Chief Complaint  Patient presents with  . uterine leiomyoma , unspecified locatio    New consultation    Assessment/Plan:  [name]  is a 50 y.o.  year old with a 14cm fibroid uterus (rapidly enlarging fibroids - 2cm growth over 2 months) and menometorrhagia refractory to progestin therapy and associated with iron deficiency anemia.  I believe that hysterectomy is an appropriate next option for this patient who has significant mass effect symptoms as well as bleeding. I believe that there is a low probability for leiomyosarcoma, however, we will contain morcellation given her history of rapid fibroid growth. She declines lupron therapy due to side effects and is not a good candidate for [place] due to the rapid enlargment of fibroids and need for pathology.  We will follow-up the results of today's endometrial biopsy, however, I discussed that this does not rule out myometrial pathology.  I recommend robotic assisted total hysterectomy, bilateral salpingectomy, possible oophorectomy (if cancer is diagnosed).  I discussed risks including  bleeding, infection, damage to internal organs (such as bladder,ureters, bowels), blood clot, reoperation and rehospitalization. I discussed that these risks are elevated in cases of complex hysterectomy with enlarged fibroids.   HPI: [name] is a very pleasant 50 year old G2P2 who is seen in consultation at the request of Dr Mignon Pine for rapidly enlarging symptomatic fibroid uterus. The patient has had intermittent menometorrhagia associated with iron deficiency anemia for approximately 1 year. An ultrasound in the summer of 2016 was remarkable for uterine fibroids. Repeat US in March, 2017 showed growth in the fibroids to 12cm in largest dimension. Repeat US in May 2017 showed a uterus measuring 14.2x10.6x9cm.    The patient is thin  with no major medical problems. Her only 2 prior abdominal surgeries were cesarean sections.  Her fibroids are symptomatic with pelvic pressure, urinary frequency and heavy vaginal bleeding associated with anemia.  Current Meds:  Outpatient Encounter Prescriptions as of 02/10/2016  Medication Sig  . meclizine (ANTIVERT) 50 MG tablet Take 1 tablet (50 mg total) by mouth 3 (three) times daily as needed. (Patient not taking: Reported on 02/10/2016)  . promethazine (PHENERGAN) 12.5 MG tablet Take 12.5 mg by mouth as needed. Reported on 02/10/2016  . [DISCONTINUED] enoxaparin (LOVENOX) 100 MG/ML injection Inject 0.9 mLs (90 mg total) into the skin daily.  . [DISCONTINUED] fluticasone (FLONASE) 50 MCG/ACT nasal spray Place 2 sprays into both nostrils daily.  . [DISCONTINUED] hydrochlorothiazide (MICROZIDE) 12.5 MG capsule   . [DISCONTINUED] methocarbamol (ROBAXIN) 500 MG tablet Take 1 tablet (500 mg total) by mouth every 8 (eight) hours as needed for muscle spasms.  . [DISCONTINUED] warfarin (COUMADIN) 5 MG tablet Take 1 tablet (5 mg total) by mouth daily. Adjust as directed   No facility-administered encounter medications on file as of 02/10/2016.    Allergy: No Known Allergies  Social Hx:   Social History   Social History  . Marital Status: Married    Spouse Name: [name]   . Number of Children: 2  . Years of Education: 12+   Occupational History  . Not on file.   Social History Main Topics  . Smoking status: Current Every Day Smoker -- 0.25 packs/day    Types: Cigarettes  . Smokeless tobacco: Never Used  . Alcohol Use: No  . Drug Use: No  . Sexual Activity: Not on file  Other Topics Concern  . Not on file   Social History Narrative   Patient lives at home with husband and 2 children. [name]   Patient works at [employer].    Patient has a college education.    Patient is right handed.     Past Surgical Hx:  Past Surgical History  Procedure Laterality Date  . Cesarean section     . Tubal ligation      Past Medical Hx:  Past Medical History  Diagnosis Date  . Anemia     Past Gynecological History:  Fibroids, cesearan sections, tubal ligation  No LMP recorded.  Family Hx:  Family History  Problem Relation Age of Onset  . Diabetes Mother   . Diabetes Maternal Grandmother   . Heart disease Maternal Grandfather   . Tuberculosis Paternal Grandfather     Review of Systems:  Constitutional  Feels well,    ENT Normal appearing ears and nares bilaterally Skin/Breast  No rash, sores, jaundice, itching, dryness Cardiovascular  No chest pain, shortness of breath, or edema  Pulmonary  No cough or wheeze.  Gastro Intestinal  No nausea, vomitting, or diarrhoea. No bright red blood per rectum, no abdominal pain, change in bowel movement, or constipation.  Genito Urinary  No frequency, urgency, dysuria, + menorrhagia Musculo Skeletal  No myalgia, arthralgia, joint swelling or pain  Neurologic  No weakness, numbness, change in gait,  Psychology  No depression, anxiety, insomnia.   Vitals:  Blood pressure 110/72, pulse 78, temperature 97.8 F (36.6 C), temperature source Oral, resp. rate 18, height 5\' 7"  (1.702 m), weight 130 lb (58.968 kg), SpO2 100 %.  Physical Exam: WD in NAD Neck  Supple NROM, without any enlargements.  Lymph Node Survey No cervical supraclavicular or inguinal adenopathy Cardiovascular  Pulse normal rate, regularity and rhythm. S1 and S2 normal.  Lungs  Clear to auscultation bilateraly, without wheezes/crackles/rhonchi. Good air movement.  Skin  No rash/lesions/breakdown  Psychiatry  Alert and oriented to person, place, and time  Abdomen  Normoactive bowel sounds, abdomen soft, non-tender and thin without evidence of hernia. Mass palpable in midline 4 fingerbreadths above pubic symphysis. Back No CVA tenderness Genito Urinary  Vulva/vagina: Normal external female genitalia.  No lesions. No discharge or  bleeding.  Bladder/urethra:  No lesions or masses, well supported bladder  Vagina: normal  Cervix: Normal appearing, no lesions.  Uterus:  Bulky 16cm mobile with dominant anterior right uterine fibroid.  Adnexa: No disrete masses. Rectal  Good tone, no masses no cul de sac nodularity.  Extremities  No bilateral cyanosis, clubbing or edema.  Procedure Note:  Endometrial Biopsy  The procedure was explained.  The speculum was placed and the cervix was grasped with a tenaculum.  The endometrial biopsy was performed with 2 passes of the endometrial biopsy pipelle. Moderate tissue was obtained. The uterus sounded to 10 cm.  The patient tolerated procedure     Donaciano Eva, MD  02/10/2016, 12:18 PM

## 2016-03-03 NOTE — Interval H&P Note (Signed)
History and Physical Interval Note:  03/03/2016 6:59 AM  [name]  has presented today for surgery, with the diagnosis of uterine leiomyoma, unspecified location  The various methods of treatment have been discussed with the patient and family. After consideration of risks, benefits and other options for treatment, the patient has consented to  Procedure(s): XI ROBOTIC ASSISTED TOTAL HYSTERECTOMY WITH Salipingectomy and possible BILATERAL SALPINGO OOPHORECTOMY (N/A) as a surgical intervention .  The patient's history has been reviewed, patient examined, no change in status, stable for surgery.  I have reviewed the patient's chart and labs.  Questions were answered to the patient's satisfaction.     Donaciano Eva

## 2016-03-03 NOTE — Anesthesia Postprocedure Evaluation (Signed)
Anesthesia Post Note  Patient: [name]  Procedure(s) Performed: Procedure(s) (LRB): XI ROBOTIC ASSISTED TOTAL HYSTERECTOMY WITH BILATERAL Salipingectomy  (N/A)  Patient location during evaluation: PACU Anesthesia Type: General Level of consciousness: awake and alert Pain management: pain level controlled Vital Signs Assessment: post-procedure vital signs reviewed and stable Respiratory status: spontaneous breathing, nonlabored ventilation, respiratory function stable and patient connected to nasal cannula oxygen Cardiovascular status: blood pressure returned to baseline and stable Postop Assessment: no signs of nausea or vomiting Anesthetic complications: no    Last Vitals:  Filed Vitals:   03/03/16 1115 03/03/16 1130  BP: 129/97 117/81  Pulse: 72 73  Temp:  36.4 C  Resp: 12 11    Last Pain:  Filed Vitals:   03/03/16 1132  PainSc: 2                  Tiajuana Amass

## 2016-03-03 NOTE — Transfer of Care (Signed)
Immediate Anesthesia Transfer of Care Note  Patient: [name]  Procedure(s) Performed: Procedure(s): XI ROBOTIC ASSISTED TOTAL HYSTERECTOMY WITH BILATERAL Salipingectomy  (N/A)  Patient Location: PACU  Anesthesia Type:General  Level of Consciousness:  sedated, patient cooperative and responds to stimulation  Airway & Oxygen Therapy:Patient Spontanous Breathing and Patient connected to face mask oxgen  Post-op Assessment:  Report given to PACU RN and Post -op Vital signs reviewed and stable  Post vital signs:  Reviewed and stable  Last Vitals:  Filed Vitals:   03/03/16 0541  BP: 97/77  Pulse: 77  Temp: 37.1 C  Resp: 18    Complications: No apparent anesthesia complications

## 2016-03-03 NOTE — Op Note (Signed)
OPERATIVE NOTE 03/03/16  Surgeon: Donaciano Eva   Assistants: Dr Lahoma Crocker (an MD assistant was necessary for tissue manipulation, management of robotic instrumentation, retraction and positioning due to the complexity of the case and hospital policies).   Anesthesia: General endotracheal anesthesia  ASA Class: 2   Pre-operative Diagnosis: fibroids and rapidly enlarging, menorrhagia  Post-operative Diagnosis: same  Operation: Robotic-assisted laparoscopic total hysterectomy for a uterus >250gm with bilateral salpingectomy   Surgeon: Donaciano Eva  Assistant Surgeon: Lahoma Crocker MD  Anesthesia: GET  Urine Output: 200  Operative Findings:  : 16cm bulky uterus with intramural leiomyoma. Normal ovaries bilaterally, normal (surgically interrupted) tubes bilaterally. Benign frozen section on myomas.  Estimated Blood Loss:  <20cc      Total IV Fluids: 600 ml         Specimens: uterus, cervix, bilateral fallopian tubes         Complications:  None; patient tolerated the procedure well.         Disposition: PACU - hemodynamically stable.  Procedure Details  The patient was seen in the Holding Room. The risks, benefits, complications, treatment options, and expected outcomes were discussed with the patient.  The patient concurred with the proposed plan, giving informed consent.  The site of surgery properly noted/marked. The patient was identified as [name] and the procedure verified as a Robotic-assisted hysterectomy with bilateral salpingo oophorectomy. A Time Out was held and the above information confirmed.  After induction of anesthesia, the patient was draped and prepped in the usual sterile manner. Pt was placed in supine position after anesthesia and draped and prepped in the usual sterile manner. The abdominal drape was placed after the CholoraPrep had been allowed to dry for 3 minutes.  Her arms were tucked to her side with all appropriate  precautions.  The shoulders were stabilized with padded shoulder blocks applied to the acromium processes.  The patient was placed in the semi-lithotomy position in [place].  The perineum was prepped with Betadine. The patient was then prepped. Foley catheter was placed.  A sterile speculum was placed in the vagina.  The cervix was grasped with a single-tooth tenaculum and dilated with Kennon Rounds dilators.  The ZUMI uterine manipulator with a medium colpotomizer ring was placed without difficulty.  A pneum occluder balloon was placed over the manipulator.  OG tube placement was confirmed and to suction.   Next, a 5 mm skin incision was made 1 cm below the subcostal margin in the midclavicular line.  The 5 mm Optiview port and scope was used for direct entry.  Opening pressure was under 10 mm CO2.  The abdomen was insufflated and the findings were noted as above.   At this point and all points during the procedure, the patient's intra-abdominal pressure did not exceed 15 mmHg. Next, a 10 mm skin incision was made in the umbilicus and a right and left port was placed about 10 cm lateral to the robot port on the right and left side. All ports were placed under direct visualization.  The patient was placed in steep Trendelenburg.  Bowel was folded away into the upper abdomen.  The robot was docked in the normal manner.  The fallopian tubes were separated from the underlying ovaries bilaterally and removed separately.The hysterectomy was started after the round ligament on the right side was incised and the retroperitoneum was entered and the pararectal space was developed.  The ureter was noted to be on the medial leaf of  the broad ligament.  The peritoneum above the ureter was incised and stretched and the utero-ovarian ligament was skeletonized, cauterized and cut.  The posterior peritoneum was taken down to the level of the KOH ring.  The anterior peritoneum was also taken down.  The bladder flap was created to  the level of the KOH ring.  The uterine artery on the right side was skeletonized, cauterized and cut in the normal manner.  A similar procedure was performed on the left.  The colpotomy was made and the uterus, cervix, bilateral ovaries and tubes were amputated. The uterus was delivered to the vagina.  It was bivalved in the vagina and the uterine myomas were removed separately from within the uterine specimen after bivalving, to debulk the uterus and allow for delivery through the vagina. Morcellation of the specimen vaginally took place over 45 minutes. Pedicles were inspected and excellent hemostasis was achieved.    The colpotomy at the vaginal cuff was closed with Vicryl on a CT1 needle with interrupted figure of 8 sutures.  Irrigation was used and excellent hemostasis was achieved.  At this point in the procedure was completed.  Robotic instruments were removed under direct visulaization.  The robot was undocked. The 10 mm ports were closed with Vicryl on a UR-5 needle and the fascia was closed with 0 Vicryl on a UR-5 needle.  The skin was closed with 4-0 Vicryl in a subcuticular manner.  Dermabond was applied.  Sponge, lap and needle counts correct x 2.  The patient was taken to the recovery room in stable condition.  The vagina was swabbed with  minimal bleeding noted.   All instrument and needle counts were correct x  3.   The patient was transferred to the recovery room in a stable condition.  Donaciano Eva, MD

## 2016-03-03 NOTE — Anesthesia Procedure Notes (Signed)
Procedure Name: Intubation Date/Time: 03/03/2016 7:35 AM Performed by: Maxwell Caul Pre-anesthesia Checklist: Patient identified, Emergency Drugs available, Suction available and Patient being monitored Patient Re-evaluated:Patient Re-evaluated prior to inductionOxygen Delivery Method: Circle system utilized Preoxygenation: Pre-oxygenation with 100% oxygen Intubation Type: IV induction Ventilation: Mask ventilation without difficulty Laryngoscope Size: Mac and 3 Grade View: Grade I Tube type: Oral Tube size: 7.0 mm Number of attempts: 1 Airway Equipment and Method: Stylet Placement Confirmation: ETT inserted through vocal cords under direct vision,  positive ETCO2 and breath sounds checked- equal and bilateral Secured at: 21 cm Tube secured with: Tape Dental Injury: Teeth and Oropharynx as per pre-operative assessment

## 2016-03-04 ENCOUNTER — Other Ambulatory Visit: Payer: Self-pay | Admitting: Gynecologic Oncology

## 2016-03-04 DIAGNOSIS — G8918 Other acute postprocedural pain: Secondary | ICD-10-CM

## 2016-03-04 DIAGNOSIS — D251 Intramural leiomyoma of uterus: Secondary | ICD-10-CM | POA: Diagnosis not present

## 2016-03-04 LAB — BASIC METABOLIC PANEL
Anion gap: 5 (ref 5–15)
BUN: 10 mg/dL (ref 6–20)
CALCIUM: 8.2 mg/dL — AB (ref 8.9–10.3)
CO2: 24 mmol/L (ref 22–32)
CREATININE: 0.68 mg/dL (ref 0.44–1.00)
Chloride: 108 mmol/L (ref 101–111)
Glucose, Bld: 105 mg/dL — ABNORMAL HIGH (ref 65–99)
Potassium: 3.6 mmol/L (ref 3.5–5.1)
SODIUM: 137 mmol/L (ref 135–145)

## 2016-03-04 LAB — CBC
HCT: 26.7 % — ABNORMAL LOW (ref 36.0–46.0)
Hemoglobin: 8.7 g/dL — ABNORMAL LOW (ref 12.0–15.0)
MCH: 25.5 pg — ABNORMAL LOW (ref 26.0–34.0)
MCHC: 32.6 g/dL (ref 30.0–36.0)
MCV: 78.3 fL (ref 78.0–100.0)
PLATELETS: 233 10*3/uL (ref 150–400)
RBC: 3.41 MIL/uL — ABNORMAL LOW (ref 3.87–5.11)
RDW: 27.2 % — AB (ref 11.5–15.5)
WBC: 7.4 10*3/uL (ref 4.0–10.5)

## 2016-03-04 MED ORDER — ENOXAPARIN (LOVENOX) PATIENT EDUCATION KIT
PACK | Freq: Once | Status: AC
  Administered 2016-03-04: 11:00:00
  Filled 2016-03-04: qty 1

## 2016-03-04 MED ORDER — OXYCODONE-ACETAMINOPHEN 5-325 MG PO TABS: 1.0000 | ORAL_TABLET | ORAL | Status: DC | PRN

## 2016-03-04 MED ORDER — ENOXAPARIN SODIUM 40 MG/0.4ML ~~LOC~~ SOLN: 40.0000 mg | SUBCUTANEOUS | Status: DC

## 2016-03-04 MED ORDER — TRAMADOL HCL 50 MG PO TABS: 50.0000 mg | ORAL_TABLET | Freq: Four times a day (QID) | ORAL | Status: DC | PRN

## 2016-03-04 NOTE — Progress Notes (Signed)
Patient alert and oriented with pain controlled. Patient given discharge instructions and prescriptions. All questions and concerns answered. Patient requested medication that was not as strong as the prescribed medication for usage at home. PA called a notified of request received call back and order for tramadol was sent to patient's pharmacy. Patient was notified of prescription sent to pharmacy and to take one medication or the other.

## 2016-03-04 NOTE — Discharge Instructions (Signed)
03/04/2016  Return to work: 4-6 weeks if applicable  Activity: 1. Be up and out of the bed during the day.  Take a nap if needed.  You may walk up steps but be careful and use the hand rail.  Stair climbing will tire you more than you think, you may need to stop part way and rest.   2. No lifting or straining for 6 weeks.  3. No driving for 1 week(s).  Do not drive if you are taking narcotic pain medicine.  4. Shower daily.  Use soap and water on your incision and pat dry; don't rub.  No tub baths until cleared by your surgeon.   5. No sexual activity and nothing in the vagina for 6-8 weeks.  6. You may experience a small amount of clear drainage from your incisions, which is normal.  If the drainage persists or increases, please call the office.   Diet: 1. Low sodium Heart Healthy Diet is recommended.  2. It is safe to use a laxative, such as Miralax or Colace, if you have difficulty moving your bowels.   Wound Care: 1. Keep clean and dry.  Shower daily.  Reasons to call the Doctor:  Fever - Oral temperature greater than 100.4 degrees Fahrenheit  Foul-smelling vaginal discharge  Difficulty urinating  Nausea and vomiting  Increased pain at the site of the incision that is unrelieved with pain medicine.  Difficulty breathing with or without chest pain  New calf pain especially if only on one side  Sudden, continuing increased vaginal bleeding with or without clots.   Contacts: For questions or concerns you should contact:  Dr. Everitt Amber at [phone]  Joylene John, NP at [phone]  After Hours: call [phone] and have the GYN Oncologist paged/contacted  How and Where to Give Subcutaneous Enoxaparin Injections Enoxaparin is an injectable medicine. It is used to help prevent blood clots from developing in your veins. Health care providers often use anticoagulants like enoxaparin to prevent clots following surgery. Enoxaparin is also used in combination with  other medicines to treat blood clots and heart attacks. If blood clots are left untreated, they can be life threatening.  Enoxaparin comes in single-use syringes. You inject enoxaparin through a syringe into your belly (abdomen). You should change the injection site each time you give yourself a shot. Continue the enoxaparin injections as directed by your health care provider. Your health care provider will use blood clotting test results to decide when you can safely stop using enoxaparin injections. If your health care provider prescribes any additional medicines, use the medicines exactly as directed. HOW DO I INJECT ENOXAPARIN?  9. Wash your hands with soap and water. 10. Clean the selected injection site as directed by your health care provider. 11. Remove the needle cap by pulling it straight off the syringe. 12. Hold the syringe like a pencil using your writing hand. 13. Use your other hand to pinch and hold an inch of the cleansed skin. 14. Insert the entire needle straight down into the fold of skin. 15. Push the plunger with your thumb until the syringe is empty. 16. Pull the needle straight out of your skin. 17. Enoxaparin injection prefilled syringes and graduated prefilled syringes are available with a system that shields the needle after injection. After you have completed your injection and removed the needle from your skin, firmly push down on the plunger. The protective sleeve will automatically cover the needle and you will hear a click. The click  means the needle is safely covered. 18. Place the syringe in the nearest needle box, also called a sharps container. If you do not have a sharps container, you can use a hard-sided plastic container with a secure lid, such as an empty laundry detergent bottle. WHAT ELSE DO I NEED TO KNOW?  Do not use enoxaparin if:  You have allergies to heparin or pork products.  You have been diagnosed with a condition called thrombocytopenia.  Do  not use the syringe or needle more than one time.  Use medicines only as directed by your health care provider.  Changes in medicines, supplements, diet, and illness can affect your anticoagulation therapy. Be sure to inform your health care provider of any of these changes.  It is important that you tell all of your health care providers and your dentist that you are taking an anticoagulant, especially if you are injured or plan to have any type of procedure.  While on anticoagulants, you will need to have blood tests done routinely as directed by your health care provider.  While using this medicine, avoid physical activities or sports that could result in a fall or cause injury.  Follow up with your laboratory test and health care provider appointments as directed. It is very important to keep your appointments. Not keeping appointments could result in a chronic or permanent injury, pain, or disability.  Before giving your medicine, you should make sure the injection is a clear and colorless or pale yellow solution. If your medicine becomes discolored or if there are particles in the syringe, do not use it and notify your health care provider.  Keep your medicine safely stored at room temperature. SEEK MEDICAL CARE IF:  You develop any rashes on your skin.  You have large areas of bruising on your skin.  You have any worsening of the condition for which you take Enoxaparin.  You develop a fever. SEEK IMMEDIATE MEDICAL CARE IF:  You develop bleeding problems such as:  Bleeding from the gums or nose that does not stop quickly.  Vomiting blood or coughing up blood.  Blood in your urine.  Blood in your stool, or stool that has a dark, tarry, or coffee grounds appearance.  A cut that does not stop bleeding within 10 minutes. These symptoms may represent a serious problem that is an emergency. Do not wait to see if the symptoms will go away. Get medical help right away. Call your  local emergency services (911 in the [place] Do not drive yourself to the hospital.    This information is not intended to replace advice given to you by your health care provider. Make sure you discuss any questions you have with your health care provider.   Document Released: 06/25/2004 Document Revised: 09/14/2014 Document Reviewed: 02/08/2014 Elsevier Interactive Patient Education 2016 [employer]  Acetaminophen; Oxycodone tablets What is this medicine? ACETAMINOPHEN; OXYCODONE (a set a MEE noe fen; ox i KOE done) is a pain reliever. It is used to treat moderate to severe pain. This medicine may be used for other purposes; ask your health care provider or pharmacist if you have questions. What should I tell my health care provider before I take this medicine? They need to know if you have any of these conditions: -brain tumor -Crohn's disease, inflammatory bowel disease, or ulcerative colitis -drug abuse or addiction -head injury -heart or circulation problems -if you often drink alcohol -kidney disease or problems going to the bathroom -liver disease -lung disease,  asthma, or breathing problems -an unusual or allergic reaction to acetaminophen, oxycodone, other opioid analgesics, other medicines, foods, dyes, or preservatives -pregnant or trying to get pregnant -breast-feeding How should I use this medicine? Take this medicine by mouth with a full glass of water. Follow the directions on the prescription label. You can take it with or without food. If it upsets your stomach, take it with food. Take your medicine at regular intervals. Do not take it more often than directed. Talk to your pediatrician regarding the use of this medicine in children. Special care may be needed. Patients over 60 years old may have a stronger reaction and need a smaller dose. Overdosage: If you think you have taken too much of this medicine contact a poison control center or emergency room at  once. NOTE: This medicine is only for you. Do not share this medicine with others. What if I miss a dose? If you miss a dose, take it as soon as you can. If it is almost time for your next dose, take only that dose. Do not take double or extra doses. What may interact with this medicine? -alcohol -antihistamines -barbiturates like amobarbital, butalbital, butabarbital, methohexital, pentobarbital, phenobarbital, thiopental, and secobarbital -benztropine -drugs for bladder problems like solifenacin, trospium, oxybutynin, tolterodine, hyoscyamine, and methscopolamine -drugs for breathing problems like ipratropium and tiotropium -drugs for certain stomach or intestine problems like propantheline, homatropine methylbromide, glycopyrrolate, atropine, belladonna, and dicyclomine -general anesthetics like etomidate, ketamine, nitrous oxide, propofol, desflurane, enflurane, halothane, isoflurane, and sevoflurane -medicines for depression, anxiety, or psychotic disturbances -medicines for sleep -muscle relaxants -naltrexone -narcotic medicines (opiates) for pain -phenothiazines like perphenazine, thioridazine, chlorpromazine, mesoridazine, fluphenazine, prochlorperazine, promazine, and trifluoperazine -scopolamine -tramadol -trihexyphenidyl This list may not describe all possible interactions. Give your health care provider a list of all the medicines, herbs, non-prescription drugs, or dietary supplements you use. Also tell them if you smoke, drink alcohol, or use illegal drugs. Some items may interact with your medicine. What should I watch for while using this medicine? Tell your doctor or health care professional if your pain does not go away, if it gets worse, or if you have new or a different type of pain. You may develop tolerance to the medicine. Tolerance means that you will need a higher dose of the medication for pain relief. Tolerance is normal and is expected if you take this medicine for  a long time. Do not suddenly stop taking your medicine because you may develop a severe reaction. Your body becomes used to the medicine. This does NOT mean you are addicted. Addiction is a behavior related to getting and using a drug for a non-medical reason. If you have pain, you have a medical reason to take pain medicine. Your doctor will tell you how much medicine to take. If your doctor wants you to stop the medicine, the dose will be slowly lowered over time to avoid any side effects. You may get drowsy or dizzy. Do not drive, use machinery, or do anything that needs mental alertness until you know how this medicine affects you. Do not stand or sit up quickly, especially if you are an older patient. This reduces the risk of dizzy or fainting spells. Alcohol may interfere with the effect of this medicine. Avoid alcoholic drinks. There are different types of narcotic medicines (opiates) for pain. If you take more than one type at the same time, you may have more side effects. Give your health care provider a list of all medicines  you use. Your doctor will tell you how much medicine to take. Do not take more medicine than directed. Call emergency for help if you have problems breathing. The medicine will cause constipation. Try to have a bowel movement at least every 2 to 3 days. If you do not have a bowel movement for 3 days, call your doctor or health care professional. Do not take Tylenol (acetaminophen) or medicines that have acetaminophen with this medicine. Too much acetaminophen can be very dangerous. Many nonprescription medicines contain acetaminophen. Always read the labels carefully to avoid taking more acetaminophen. What side effects may I notice from receiving this medicine? Side effects that you should report to your doctor or health care professional as soon as possible: -allergic reactions like skin rash, itching or hives, swelling of the face, lips, or tongue -breathing difficulties,  wheezing -confusion -light headedness or fainting spells -severe stomach pain -unusually weak or tired -yellowing of the skin or the whites of the eyes Side effects that usually do not require medical attention (report to your doctor or health care professional if they continue or are bothersome): -dizziness -drowsiness -nausea -vomiting This list may not describe all possible side effects. Call your doctor for medical advice about side effects. You may report side effects to FDA at 1-800-FDA-1088. Where should I keep my medicine? Keep out of the reach of children. This medicine can be abused. Keep your medicine in a safe place to protect it from theft. Do not share this medicine with anyone. Selling or giving away this medicine is dangerous and against the law. This medicine may cause accidental overdose and death if it taken by other adults, children, or pets. Mix any unused medicine with a substance like cat litter or coffee grounds. Then throw the medicine away in a sealed container like a sealed bag or a coffee can with a lid. Do not use the medicine after the expiration date. Store at room temperature between 20 and 25 degrees C (68 and 77 degrees F). NOTE: This sheet is a summary. It may not cover all possible information. If you have questions about this medicine, talk to your doctor, pharmacist, or health care provider.    2016, Elsevier/Gold Standard. (2014-07-25 15:18:46)

## 2016-03-04 NOTE — Discharge Summary (Signed)
Physician Discharge Summary  Patient ID: [name] MRN: [id number] DOB/AGE: 1966-06-11 50 y.o.  Admit date: 03/03/2016 Discharge date: 03/04/2016  Admission Diagnoses: Fibroids, intramural  Discharge Diagnoses:  Principal Problem:   Fibroids, intramural Active Problems:   Uterine fibroid   Discharged Condition:  The patient is in good condition and stable for discharge.    Hospital Course: On 03/03/2016, the patient underwent the following: Procedure(s): XI ROBOTIC ASSISTED TOTAL HYSTERECTOMY WITH BILATERAL Salpingectomy.  The postoperative course was uneventful with no more febrile episodes after the 101.1 on operative day.  She was discharged to home on postoperative day 1 tolerating a regular diet, passing flatus, voiding, minimal pain.  She is continue lovenox for a total of 2 weeks post-op due to history of DVT two years ago per Dr. Delsa Sale.    Consults: None  Significant Diagnostic Studies: None  Treatments: surgery: see above  Discharge Exam: Blood pressure 111/66, pulse 76, temperature 98.3 F (36.8 C), temperature source Oral, resp. rate 16, height 5\' 6"  (1.676 m), weight 129 lb 12 oz (58.854 kg), last menstrual period 03/01/2016, SpO2 100 %. General appearance: alert, cooperative and no distress Resp: clear to auscultation bilaterally Cardio: regular rate and rhythm, S1, S2 normal, no murmur, click, rub or gallop GI: soft, non-tender; bowel sounds normal; no masses,  no organomegaly Extremities: extremities normal, atraumatic, no cyanosis or edema Incision/Wound: Lap sites to the abdomen with dermabond without erythema or drainage  Disposition: Home      Discharge Instructions    Call MD for:  difficulty breathing, headache or visual disturbances    Complete by:  As directed      Call MD for:  extreme fatigue    Complete by:  As directed      Call MD for:  hives    Complete by:  As directed      Call MD for:  persistant dizziness or light-headedness     Complete by:  As directed      Call MD for:  persistant nausea and vomiting    Complete by:  As directed      Call MD for:  redness, tenderness, or signs of infection (pain, swelling, redness, odor or green/yellow discharge around incision site)    Complete by:  As directed      Call MD for:  severe uncontrolled pain    Complete by:  As directed      Call MD for:  temperature >100.4    Complete by:  As directed      Diet - low sodium heart healthy    Complete by:  As directed      Driving Restrictions    Complete by:  As directed   No driving for 1 week.  Do not take narcotics and drive.     Increase activity slowly    Complete by:  As directed      Lifting restrictions    Complete by:  As directed   No lifting greater than 10 lbs.     Sexual Activity Restrictions    Complete by:  As directed   No sexual activity, nothing in the vagina, for 6-8 weeks.            Medication List    TAKE these medications        enoxaparin 40 MG/0.4ML injection  Commonly known as:  LOVENOX  Inject 0.4 mLs (40 mg total) into the skin daily.     ferrous sulfate 325 (65 FE) MG  tablet  Take 325 mg by mouth daily.     multivitamin with minerals Tabs tablet  Take 1 tablet by mouth daily.     oxyCODONE-acetaminophen 5-325 MG tablet  Commonly known as:  PERCOCET/ROXICET  Take 1-2 tablets by mouth every 4 (four) hours as needed (moderate to severe pain).       Follow-up Information    Follow up with Donaciano Eva, MD On 03/25/2016.   Specialty:  Obstetrics and Gynecology   Why:  at 2:45pm at the [facility] information:   [address] [place] [phone]       Greater than thirty minutes were spend for face to face discharge instructions and discharge orders/summary in EPIC.   Signed: CROSS, MELISSA DEAL 03/04/2016, 9:44 AM

## 2016-03-06 ENCOUNTER — Telehealth: Payer: Self-pay

## 2016-03-06 NOTE — Telephone Encounter (Signed)
Orders received from [place] to contact the patient to update with final pathology report was "Benign" . Also to see how the patient is doing postoperatively . Patient contacted with results , patient states understanding. 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 Patient states she will buy some today , patient instructed to start with one capful 2 times today and if no BM tomorrow increase to 2 capfuls . Patient states understanding and will call if she does not have a BM. Patient denies any pain or decrease appetite. Patient instructed to call with any changes , questions or concerns .

## 2016-03-07 LAB — TYPE AND SCREEN
ABO/RH(D): A POS
ANTIBODY SCREEN: POSITIVE
DAT, IgG: NEGATIVE
Donor AG Type: NEGATIVE
Unit division: 0

## 2016-03-18 ENCOUNTER — Telehealth: Payer: Self-pay | Admitting: Gynecologic Oncology

## 2016-03-18 NOTE — Telephone Encounter (Signed)
Returned call to patient.  Advised to please call the office.

## 2016-03-19 ENCOUNTER — Telehealth: Payer: Self-pay

## 2016-03-19 NOTE — Telephone Encounter (Signed)
Follow up call placed to see how the patient was doing postoperatively, patient states she doing ok with the exception of experiencing pain mainly at night . Patient states she has been forcing herself to stay up from the time she gets up until 6 PM and that's when the pelvic pain "achy" starts. Patient states she has not been taking the Tramadol ,but is taking the Percocet at night . Patient also states she has some "pink" spotting with a slight odor , states it smells like a "chemical" not old blood . Patient instructed to try the Tramadol today as directed and take a Percocet for break through pain if needed . Patient instructed to call tomorrow to update [place] , patient agreed to plan and will call tomorrow . Patient denies constipation or decreased appetite at this time , will update Dr Denman George . Patient aware to call with any changes , questions or concerns.

## 2016-03-20 NOTE — Telephone Encounter (Signed)
Follow up call placed to see if the patient had some relief in the pain she experienced mainly at night as discussed yesterday. Patient states she decreased her daily activity with periods of rest and states the rest of her day yesterday was pain free. Patient states she took the Tramadol as directed and that it was very effective and she had a restful sleep last night. Patient states she still has an odor that smells like a "chemical". Patient informed that the odor could be caused from dissolving stitches ,old blood or other fluids from the abdominal cavity , but will update Dr Denman George . Patient reminded that she does have a scheduled post-op follow up appointment with Dr Denman George next week Wednesday 03/25/2016 at 2:45 . Patient states being aware , denies further questions at this time.

## 2016-03-25 ENCOUNTER — Encounter: Payer: Self-pay | Admitting: Gynecologic Oncology

## 2016-03-25 ENCOUNTER — Ambulatory Visit: Payer: BLUE CROSS/BLUE SHIELD | Attending: Gynecologic Oncology | Admitting: Gynecologic Oncology

## 2016-03-25 VITALS — BP 108/78 | HR 82 | Temp 97.8°F | Resp 18 | Ht 66.0 in | Wt 127.6 lb

## 2016-03-25 DIAGNOSIS — D251 Intramural leiomyoma of uterus: Secondary | ICD-10-CM | POA: Diagnosis present

## 2016-03-25 DIAGNOSIS — R102 Pelvic and perineal pain: Secondary | ICD-10-CM | POA: Insufficient documentation

## 2016-03-25 DIAGNOSIS — D259 Leiomyoma of uterus, unspecified: Secondary | ICD-10-CM | POA: Diagnosis not present

## 2016-03-25 DIAGNOSIS — Z90722 Acquired absence of ovaries, bilateral: Secondary | ICD-10-CM | POA: Insufficient documentation

## 2016-03-25 DIAGNOSIS — R5383 Other fatigue: Secondary | ICD-10-CM | POA: Insufficient documentation

## 2016-03-25 DIAGNOSIS — L03818 Cellulitis of other sites: Secondary | ICD-10-CM | POA: Insufficient documentation

## 2016-03-25 DIAGNOSIS — Z90710 Acquired absence of both cervix and uterus: Secondary | ICD-10-CM | POA: Insufficient documentation

## 2016-03-25 DIAGNOSIS — N760 Acute vaginitis: Secondary | ICD-10-CM | POA: Insufficient documentation

## 2016-03-25 DIAGNOSIS — N898 Other specified noninflammatory disorders of vagina: Secondary | ICD-10-CM | POA: Insufficient documentation

## 2016-03-25 MED ORDER — CIPROFLOXACIN HCL 250 MG PO TABS: 250.0000 mg | ORAL_TABLET | Freq: Two times a day (BID) | ORAL | Status: DC

## 2016-03-25 MED ORDER — METRONIDAZOLE 500 MG PO TABS: 500.0000 mg | ORAL_TABLET | Freq: Three times a day (TID) | ORAL | Status: DC

## 2016-03-25 MED ORDER — OXYCODONE-ACETAMINOPHEN 5-325 MG PO TABS: 1.0000 | ORAL_TABLET | ORAL | Status: DC | PRN

## 2016-03-25 NOTE — Progress Notes (Signed)
POSTOPERATIVE FOLLOW-UP  HPI:  [name] is a 50 y.o. year old No obstetric history on file. initially seen in consultation on 02/10/16 for symptomatic uterine fibroids with a rapidly enlarging 14cm fibroid.  She then underwent a robotic assisted total hysterectomy (>250gm) with bilateral salpingectomy on 0000000 without complications.  Her postoperative course was uncomplicated.  Her final pathology revealed a 675gm uterus with leiomyoma but no malignancy.  She is seen today for a postoperative check and to discuss her pathology results and ongoing plan.  Since discharge from the hospital, she is feeling fatigued and some pelvic pain. She has vaginal discharge (mucoid, yellow).  She has improving appetite, normal bowel and bladder function, and pain controlled with PO medication but she needs more. She has no other complaints today.    Review of systems: Constitutional:  She has no weight gain or weight loss. She has no fever or chills. Eyes: No blurred vision Ears, Nose, Mouth, Throat: No dizziness, headaches or changes in hearing. No mouth sores. Cardiovascular: No chest pain, palpitations or edema. Respiratory:  No shortness of breath, wheezing or cough Gastrointestinal: She has normal bowel movements without diarrhea or constipation. She denies any nausea or vomiting. She denies blood in her stool or heart burn. Genitourinary:  + vaginal discharge Musculoskeletal: Denies muscle weakness or joint pains.  Skin:  She has no skin changes, rashes or itching Neurological:  Denies dizziness or headaches. No neuropathy, no numbness or tingling. Psychiatric:  She denies depression or anxiety. Hematologic/Lymphatic:   No easy bruising or bleeding   Physical Exam: Blood pressure 108/78, pulse 82, temperature 97.8 F (36.6 C), temperature source Oral, resp. rate 18, height 5\' 6"  (1.676 m), weight 127 lb 9.6 oz (57.879 kg), last menstrual period 02/08/2016, SpO2 100 %. General: Well dressed, well  nourished in no apparent distress.   HEENT:  Normocephalic and atraumatic, no lesions.  Extraocular muscles intact. Sclerae anicteric. Pupils equal, round, reactive. No mouth sores or ulcers. Thyroid is normal size, not nodular, midline. Abdomen:  Soft, nontender, nondistended.  No palpable masses.  No hepatosplenomegaly.  No ascites. Normal bowel sounds.  No hernias.  Incisions are well healed Genitourinary: Normal EGBUS  Vaginal cuff intact. + small amount of yellow mucoid discharge from vaginal cuff which is intact. Fullness behind cuff.   Extremities: No cyanosis, clubbing or edema.  No calf tenderness or erythema. No palpable cords. Psychiatric: Mood and affect are appropriate. Neurological: Awake, alert and oriented x 3. Sensation is intact, no neuropathy.  Musculoskeletal: No pain, normal strength and range of motion.  Assessment:    50 y.o. year old with vaginal cuff cellulitis.   S/p robotic hysterectomy, bilateral salpingectomy on 03/03/16 for a uterus weight 675gm with fibroids.   Plan: 1) Pathology reports reviewed today 2) Treatment counseling - I discussed we will treat her with cipro and flagyl x 1 week. She was given the opportunity to ask questions, which were answered to her satisfaction, and she is agreement with the above mentioned plan of care. Recommended no intercourse until 8 weeks postop. 3)  Return to clinic on a prn basis. She will follow-up with Dr [name] for well -woman care annually.  Donaciano Eva, MD

## 2016-03-25 NOTE — Patient Instructions (Signed)
Begin taking Cipro and Flagyl for seven days to treat the mild vaginal infection.  No intercourse for another month.  Plan to follow up with Dr. Geoffry Paradise for your well woman care.  Please call for worsening symptoms, new problems, concerns, or questions.

## 2018-05-03 ENCOUNTER — Ambulatory Visit: Payer: BLUE CROSS/BLUE SHIELD | Admitting: Family Medicine

## 2018-05-03 ENCOUNTER — Ambulatory Visit: Payer: Self-pay | Admitting: Family Medicine

## 2018-05-03 NOTE — Telephone Encounter (Signed)
Author spoke with [place], Dr. Frederik Pear CMA, who stated pt. Could be added onto 5PM slot, and Dr. Charlett Blake agreed. Appointment made for 515 tonight, as pt. Stated she may not be able to be on time for 5PM.

## 2018-05-03 NOTE — Telephone Encounter (Signed)
Pt stated she noted a bruise to the left leg just below the knee toward the right side. Pt stated there is 1 bruise and she noted the bruise yesterday. Pt stated that she doesn't remember hitting it on anything. Pt has a h/o DVT. Pt is not on any blood thinners but stated she did take a BC powder Sunday.  Pt denies swelling to the left leg or pain when standing on the leg. Pt stated that she the bruise "feels a little sore." Agnes Lawrence at PCP office and discussed triage call. Raquel Sarna stated that she will try and get the pt added on a schedule and will call pt back within the hour.  Call became disconnected and called pt back and informed her of above. Pt advised to outline the bruise to see if bruise is increasing or decreasing in size. Pt advised to go to ED if she develops any edema, pain when standing and to call 911 if develops any breathing difficulty. Pt verbalized understanding.   Reason for Disposition . Minor bruise    Pt has h/o DVT  Answer Assessment - Initial Assessment Questions 1. APPEARANCE of BRUISE: "Describe the bruise."      Size of small egg, dark and "a little tender" 2. SIZE: "How large is the bruise?"      Size of a small egg 3. NUMBER: "How many bruises are there?"      1  4. LOCATION: "Where is the bruise located?"      Left leg just below the knee towards the right side 5. ONSET: "How long ago did the bruise occur?"      yesterday 6. CAUSE: "Tell me how it happened."     Pt noted after taking a shower does not remember hitting it against anything 7. MEDICAL HISTORY: "Do you have any medical problems that can cause easy bruising or bleeding?" (e.g., leukemia, liver disease, recent chemotherapy)     H/o DVT 8. MEDICATIONS : "Do you take any medications which thin the blood such as: aspirin, heparin, ibuprofen (NSAIDS), Plavix, or Coumadin?"     BC powder last Sunday 9. OTHER SYMPTOMS: "Do you have any other symptoms?"  (e.g., weakness, dizziness, pain, fever,  nosebleed, blood in urine/stool)     no 10. PREGNANCY: "Is there any chance you are pregnant?" "When was your last menstrual period?"       No- n/a  Protocols used: BRUISES-A-AH

## 2018-05-03 NOTE — Progress Notes (Addendum)
[facility], [address] [place], [place] [address] [phone]  Date:  05/05/2018 v  Name:  [name]   DOB:  [birth year]   MRN:  [id number]  PCP:  Darreld Mclean, MD    Chief Complaint: New Patient (Initial Visit) (left leg bruise, no known injury, seen at ER yesterday was told it wasnt a dvt, prior dvt)   History of Present Illness:  [name] is a 52 y.o. very pleasant female patient who presents with the following:  Re-establishing care with me, last seen a few years ago History of DVT  She noted a bruise on her medial left knee a few days ago- she was not sure what caused it She first noted the bruise 2 days ago She went to the ER but did not have any ultrasound She feels like the bruise is getting better  She mostly works from home Her work is not physical- she is not aware of any trauma that could have caused this bruise   Her eldest daughter is in college at [employer], and her younger daughter is at [employer] high school  No other unusual bruises or bleeding No chest pain or SOB S/p hyst in 2017 - pt notes that her anemia has resolved since having this done  She is glad that he finally had this operation which she really nedeed!  Patient Active Problem List   Diagnosis Date Noted  . Uterine fibroid 03/03/2016  . Fibroids, intramural 02/10/2016  . DVT (deep venous thrombosis) ([place]) 08/01/2014    Past Medical History:  Diagnosis Date  . Anemia   . Chicken pox   . Left leg DVT ([place]) 2014  . Uterine leiomyoma     Past Surgical History:  Procedure Laterality Date  . BUNIONECTOMY Bilateral 2007  . CESAREAN SECTION     x 2  . ROBOTIC ASSISTED TOTAL HYSTERECTOMY WITH BILATERAL SALPINGO OOPHERECTOMY N/A 03/03/2016   Procedure: XI ROBOTIC ASSISTED TOTAL HYSTERECTOMY WITH BILATERAL Salipingectomy ;  Surgeon: Everitt Amber, MD;  Location: [facility];  Service: Gynecology;  Laterality: N/A;  . TUBAL LIGATION       Social History   Tobacco Use  . Smoking status: Current Every Day Smoker    Packs/day: 0.25    Years: 15.00    Pack years: 3.75    Types: Cigarettes  . Smokeless tobacco: Never Used  Substance Use Topics  . Alcohol use: No  . Drug use: No    Family History  Problem Relation Age of Onset  . Diabetes Mother   . Depression Mother   . Stroke Mother   . Diabetes Maternal Grandmother   . Heart disease Maternal Grandfather   . Tuberculosis Paternal Grandfather     No Known Allergies  Medication list has been reviewed and updated.  Current Outpatient Medications on File Prior to Visit  Medication Sig Dispense Refill  . Multiple Vitamin (MULTIVITAMIN WITH MINERALS) TABS tablet Take 1 tablet by mouth daily.     No current facility-administered medications on file prior to visit.     Review of Systems:  As per HPI- otherwise negative.  No fever or chills No CP or SOB   Physical Examination: Vitals:   05/05/18 0955  BP: 112/68  Pulse: 75  Resp: 16  SpO2: 100%   Vitals:   05/05/18 0955  Weight: 129 lb (58.5 kg)  Height: 5\' 6"  (1.676 m)   Body mass index  is 20.82 kg/m. Ideal Body Weight: Weight in (lb) to have BMI = 25: 154.6  GEN: WDWN, NAD, Non-toxic, A & O x 3, slim build, looks well  HEENT: Atraumatic, Normocephalic. Neck supple. No masses, No LAD.  Bilateral TM wnl, oropharynx normal.  PEERL,EOMI.   Ears and Nose: No external deformity. CV: RRR, No M/G/R. No JVD. No thrill. No extra heart sounds. PULM: CTA B, no wheezes, crackles, rhonchi. No retractions. No resp. distress. No accessory muscle use. ABD: S, NT, ND. No rebound. No HSM. EXTR: No c/c/e NEURO Normal gait.  PSYCH: Normally interactive. Conversant. Not depressed or anxious appearing.  Calm demeanor.  Left medial knee- at proximal medial shin there is a bruise approx 1.5in x 4 in in size. It is slightly tender Calf is not tender, no calf swelling  I do not suspect at DVT at this time     Assessment and Plan: Bruise  Screening for deficiency anemia - Plan: CBC  Screening for diabetes mellitus - Plan: Hemoglobin A1c, Comprehensive metabolic panel  Screening for hyperlipidemia - Plan: Lipid panel  Re-establishing care today Recent bruise on her left leg- however, I do not suspect DVT Will obtain labs as above She will alert me if any other bruising or bleeding noted Will plan further follow- up pending labs.   Signed Lamar Blinks, MD  Received her labs 8/30- letter to pt   Results for orders placed or performed in visit on 05/05/18  CBC  Result Value Ref Range   WBC 4.1 4.0 - 10.5 K/uL   RBC 4.50 3.87 - 5.11 Mil/uL   Platelets 223.0 150.0 - 400.0 K/uL   Hemoglobin 13.4 12.0 - 15.0 g/dL   HCT 40.7 36.0 - 46.0 %   MCV 90.5 78.0 - 100.0 fl   MCHC 32.9 30.0 - 36.0 g/dL   RDW 14.3 11.5 - 15.5 %  Hemoglobin A1c  Result Value Ref Range   Hgb A1c MFr Bld 6.4 4.6 - 6.5 %  Lipid panel  Result Value Ref Range   Cholesterol 246 (H) 0 - 200 mg/dL   Triglycerides 55.0 0.0 - 149.0 mg/dL   HDL 68.60 >39.00 mg/dL   VLDL 11.0 0.0 - 40.0 mg/dL   LDL Cholesterol 167 (H) 0 - 99 mg/dL   Total CHOL/HDL Ratio 4    NonHDL 177.83   Comprehensive metabolic panel  Result Value Ref Range   Sodium 138 135 - 145 mEq/L   Potassium 4.1 3.5 - 5.1 mEq/L   Chloride 101 96 - 112 mEq/L   CO2 30 19 - 32 mEq/L   Glucose, Bld 73 70 - 99 mg/dL   BUN 17 6 - 23 mg/dL   Creatinine, Ser 0.88 0.40 - 1.20 mg/dL   Total Bilirubin 0.5 0.2 - 1.2 mg/dL   Alkaline Phosphatase 55 39 - 117 U/L   AST 16 0 - 37 U/L   ALT 9 0 - 35 U/L   Total Protein 7.9 6.0 - 8.3 g/dL   Albumin 4.3 3.5 - 5.2 g/dL   Calcium 9.7 8.4 - 10.5 mg/dL   GFR 86.85 >60.00 mL/min

## 2018-05-03 NOTE — Telephone Encounter (Signed)
Author realized after making appt. With Dr. Charlett Blake, that pt. Has yet to be established with a provider here, so pt. Needs to be seen in ED. Author relayed to Dr. Charlett Blake who agreed, and called pt. To notify. Pt. Understanding, stated she would go to [facility] ED to be evaluated tonight.

## 2018-05-04 ENCOUNTER — Ambulatory Visit: Payer: BLUE CROSS/BLUE SHIELD | Admitting: Family Medicine

## 2018-05-04 ENCOUNTER — Ambulatory Visit: Payer: BLUE CROSS/BLUE SHIELD | Admitting: Internal Medicine

## 2018-05-04 ENCOUNTER — Other Ambulatory Visit: Payer: Self-pay

## 2018-05-04 ENCOUNTER — Emergency Department (HOSPITAL_BASED_OUTPATIENT_CLINIC_OR_DEPARTMENT_OTHER)
Admission: EM | Admit: 2018-05-04 | Discharge: 2018-05-04 | Disposition: A | Discharge status: Home / Self Care | Payer: BLUE CROSS/BLUE SHIELD | Attending: Emergency Medicine | Admitting: Emergency Medicine

## 2018-05-04 ENCOUNTER — Encounter (HOSPITAL_BASED_OUTPATIENT_CLINIC_OR_DEPARTMENT_OTHER): Payer: Self-pay | Admitting: Emergency Medicine

## 2018-05-04 DIAGNOSIS — X58XXXA Exposure to other specified factors, initial encounter: Secondary | ICD-10-CM | POA: Insufficient documentation

## 2018-05-04 DIAGNOSIS — Y999 Unspecified external cause status: Secondary | ICD-10-CM | POA: Diagnosis not present

## 2018-05-04 DIAGNOSIS — F17210 Nicotine dependence, cigarettes, uncomplicated: Secondary | ICD-10-CM | POA: Insufficient documentation

## 2018-05-04 DIAGNOSIS — Y939 Activity, unspecified: Secondary | ICD-10-CM | POA: Insufficient documentation

## 2018-05-04 DIAGNOSIS — Y929 Unspecified place or not applicable: Secondary | ICD-10-CM | POA: Diagnosis not present

## 2018-05-04 DIAGNOSIS — S8012XA Contusion of left lower leg, initial encounter: Secondary | ICD-10-CM

## 2018-05-04 DIAGNOSIS — M79662 Pain in left lower leg: Secondary | ICD-10-CM | POA: Diagnosis present

## 2018-05-04 DIAGNOSIS — M79605 Pain in left leg: Secondary | ICD-10-CM

## 2018-05-04 NOTE — ED Provider Notes (Signed)
[facility] EMERGENCY DEPARTMENT Provider Note   CSN: [id number] Arrival date & time: 05/04/18  0901     History   Chief Complaint Chief Complaint  Patient presents with  . Leg Pain    left     HPI [name] is a 52 y.o. female.  52 yo F with a chief complaint of a change of coloration to her left lower extremity.  This she noticed yesterday.  Denies injury.  She thinks this happened to her previously and she saw her family physician who had an ultrasound and said they had a superficial clot in her leg.  She denies a clot in the deep venous system of the leg.  Denies one in the legs previously.  Denies chest pain or shortness of breath.  The history is provided by the patient.  Leg Pain   This is a recurrent problem. The current episode started yesterday. The problem occurs constantly. The problem has not changed since onset.The pain is present in the left lower leg. The quality of the pain is described as aching. The pain is at a severity of 4/10. The pain is moderate. Pertinent negatives include no numbness and full range of motion. She has tried nothing for the symptoms. The treatment provided no relief. There has been no history of extremity trauma.    Past Medical History:  Diagnosis Date  . Anemia   . Left leg DVT ([facility]) 2014  . Uterine leiomyoma     Patient Active Problem List   Diagnosis Date Noted  . Uterine fibroid 03/03/2016  . Fibroids, intramural 02/10/2016  . DVT (deep venous thrombosis) ([facility]) 08/01/2014    Past Surgical History:  Procedure Laterality Date  . BUNIONECTOMY Bilateral 2007  . CESAREAN SECTION     x 2  . ROBOTIC ASSISTED TOTAL HYSTERECTOMY WITH BILATERAL SALPINGO OOPHERECTOMY N/A 03/03/2016   Procedure: XI ROBOTIC ASSISTED TOTAL HYSTERECTOMY WITH BILATERAL Salipingectomy ;  Surgeon: Everitt Amber, MD;  Location: [facility];  Service: Gynecology;  Laterality: N/A;  . TUBAL LIGATION       OB History   None      Home Medications     Prior to Admission medications   Medication Sig Start Date End Date Taking? Authorizing Provider  ciprofloxacin (CIPRO) 250 MG tablet Take 1 tablet (250 mg total) by mouth 2 (two) times daily. 03/25/16   Everitt Amber, MD  ferrous sulfate 325 (65 FE) MG tablet Take 325 mg by mouth daily. 11/13/15   [provider]  ibuprofen (ADVIL,MOTRIN) 200 MG tablet Take 200 mg by mouth every 6 (six) hours as needed for headache.    [provider]  metroNIDAZOLE (FLAGYL) 500 MG tablet Take 1 tablet (500 mg total) by mouth 3 (three) times daily. 03/25/16   Everitt Amber, MD  Multiple Vitamin (MULTIVITAMIN WITH MINERALS) TABS tablet Take 1 tablet by mouth daily.    [provider]  oxyCODONE-acetaminophen (PERCOCET/ROXICET) 5-325 MG tablet Take 1-2 tablets by mouth every 4 (four) hours as needed (moderate to severe pain). 03/25/16   Everitt Amber, MD  traMADol (ULTRAM) 50 MG tablet Take 1-2 tablets (50-100 mg total) by mouth every 6 (six) hours as needed for moderate pain or severe pain (Do not take with percocet). 03/04/16   Joylene John D, NP    Family History Family History  Problem Relation Age of Onset  . Diabetes Mother   . Diabetes Maternal Grandmother   . Heart disease Maternal Grandfather   . Tuberculosis  Paternal Grandfather     Social History Social History   Tobacco Use  . Smoking status: Current Every Day Smoker    Packs/day: 0.25    Years: 15.00    Pack years: 3.75    Types: Cigarettes  . Smokeless tobacco: Never Used  Substance Use Topics  . Alcohol use: No  . Drug use: No     Allergies   Patient has no known allergies.   Review of Systems Review of Systems  Constitutional: Negative for chills and fever.  HENT: Negative for congestion and rhinorrhea.   Eyes: Negative for redness and visual disturbance.  Respiratory: Negative for shortness of breath and wheezing.   Cardiovascular: Negative for chest pain and palpitations.  Gastrointestinal: Negative  for nausea and vomiting.  Genitourinary: Negative for dysuria and urgency.  Musculoskeletal: Negative for arthralgias and myalgias.  Skin: Positive for color change. Negative for pallor and wound.  Neurological: Negative for dizziness, numbness and headaches.     Physical Exam Updated Vital Signs BP 119/76 (BP Location: Right Arm)   Pulse 70   Temp 98.1 F (36.7 C) (Oral)   Resp 18   Ht 5\' 6"  (1.676 m)   Wt 61.7 kg   LMP 03/01/2016   SpO2 100%   BMI 21.95 kg/m   Physical Exam  Constitutional: She is oriented to person, place, and time. She appears well-developed and well-nourished. No distress.  HENT:  Head: Normocephalic and atraumatic.  Eyes: Pupils are equal, round, and reactive to light. EOM are normal.  Neck: Normal range of motion. Neck supple.  Cardiovascular: Normal rate and regular rhythm. Exam reveals no gallop and no friction rub.  No murmur heard. Pulmonary/Chest: Effort normal. She has no wheezes. She has no rales.  Abdominal: Soft. She exhibits no distension and no mass. There is no tenderness. There is no guarding.  Musculoskeletal: She exhibits no edema or tenderness.  Small contusion to the proximal medial aspect of left lower leg.  Mildly tender.  No noted edema to the leg.  Full range of motion of the knee.  Full range of motion of the leg.  Neurological: She is alert and oriented to person, place, and time.  Skin: Skin is warm and dry. She is not diaphoretic.  Psychiatric: She has a normal mood and affect. Her behavior is normal.  Nursing note and vitals reviewed.    ED Treatments / Results  Labs (all labs ordered are listed, but only abnormal results are displayed) Labs Reviewed - No data to display  EKG None  Radiology No results found.  Procedures Procedures (including critical care time)  Medications Ordered in ED Medications - No data to display   Initial Impression / Assessment and Plan / ED Course  I have reviewed the triage vital  signs and the nursing notes.  Pertinent labs & imaging results that were available during my care of the patient were reviewed by me and considered in my medical decision making (see chart for details).     52 yo F with a chief complaint of left leg pain.  She has a focal contusion to the area of complaint.  There is no leg swelling.  She told the nurse in triage that she had had a clot in the leg before.  On further discussion this was a superficial venous thrombosis.  I do not feel strongly that she needs an ultrasound at this time.  We will treat supportively.  PCP follow-up.  10:48 AM:  I have  discussed the diagnosis/risks/treatment options with the patient and believe the pt to be eligible for discharge home to follow-up with PCP. We also discussed returning to the ED immediately if new or worsening sx occur. We discussed the sx which are most concerning (e.g., diffuse leg swelling, sob) that necessitate immediate return. Medications administered to the patient during their visit and any new prescriptions provided to the patient are listed below.  Medications given during this visit Medications - No data to display   The patient appears reasonably screen and/or stabilized for discharge and I doubt any other medical condition or other [facility] requiring further screening, evaluation, or treatment in the ED at this time prior to discharge.    Final Clinical Impressions(s) / ED Diagnoses   Final diagnoses:  Left leg pain  Contusion of left lower leg, initial encounter    ED Discharge Orders    None       Deno Etienne, DO 05/04/18 1048

## 2018-05-04 NOTE — Discharge Instructions (Signed)
Take 4 over the counter ibuprofen tablets 3 times a day or 2 over-the-counter naproxen tablets twice a day for pain. Also take tylenol 1000mg(2 extra strength) four times a day.    

## 2018-05-04 NOTE — ED Triage Notes (Signed)
Pt reports left leg pain , denies injury Hx DVT. Bruise noted below left knee. denies shortness of breath.

## 2018-05-05 ENCOUNTER — Ambulatory Visit: Payer: BLUE CROSS/BLUE SHIELD | Admitting: Family Medicine

## 2018-05-05 ENCOUNTER — Encounter: Payer: Self-pay | Admitting: Family Medicine

## 2018-05-05 VITALS — BP 112/68 | HR 75 | Resp 16 | Ht 66.0 in | Wt 129.0 lb

## 2018-05-05 DIAGNOSIS — Z13220 Encounter for screening for lipoid disorders: Secondary | ICD-10-CM

## 2018-05-05 DIAGNOSIS — Z131 Encounter for screening for diabetes mellitus: Secondary | ICD-10-CM

## 2018-05-05 DIAGNOSIS — Z130 Encounter for screening for diseases of the blood and blood-forming organs and certain disorders involving the immune mechanism: Secondary | ICD-10-CM

## 2018-05-05 DIAGNOSIS — T148XXA Other injury of unspecified body region, initial encounter: Secondary | ICD-10-CM

## 2018-05-05 LAB — COMPREHENSIVE METABOLIC PANEL
ALT: 9 U/L (ref 0–35)
AST: 16 U/L (ref 0–37)
Albumin: 4.3 g/dL (ref 3.5–5.2)
Alkaline Phosphatase: 55 U/L (ref 39–117)
BILIRUBIN TOTAL: 0.5 mg/dL (ref 0.2–1.2)
BUN: 17 mg/dL (ref 6–23)
CO2: 30 meq/L (ref 19–32)
Calcium: 9.7 mg/dL (ref 8.4–10.5)
Chloride: 101 mEq/L (ref 96–112)
Creatinine, Ser: 0.88 mg/dL (ref 0.40–1.20)
GFR: 86.85 mL/min (ref >60.00)
GLUCOSE: 73 mg/dL (ref 70–99)
Potassium: 4.1 mEq/L (ref 3.5–5.1)
SODIUM: 138 meq/L (ref 135–145)
Total Protein: 7.9 g/dL (ref 6.0–8.3)

## 2018-05-05 LAB — LIPID PANEL
CHOL/HDL RATIO: 4
Cholesterol: 246 mg/dL — ABNORMAL HIGH (ref 0–200)
HDL: 68.6 mg/dL (ref >39.00)
LDL Cholesterol: 167 mg/dL — ABNORMAL HIGH (ref 0–99)
NONHDL: 177.83
Triglycerides: 55 mg/dL (ref 0.0–149.0)
VLDL: 11 mg/dL (ref 0.0–40.0)

## 2018-05-05 LAB — CBC
HEMATOCRIT: 40.7 % (ref 36.0–46.0)
HEMOGLOBIN: 13.4 g/dL (ref 12.0–15.0)
MCHC: 32.9 g/dL (ref 30.0–36.0)
MCV: 90.5 fl (ref 78.0–100.0)
Platelets: 223 10*3/uL (ref 150.0–400.0)
RBC: 4.5 Mil/uL (ref 3.87–5.11)
RDW: 14.3 % (ref 11.5–15.5)
WBC: 4.1 10*3/uL (ref 4.0–10.5)

## 2018-05-05 LAB — HEMOGLOBIN A1C: Hgb A1c MFr Bld: 6.4 % (ref 4.6–6.5)

## 2018-05-05 NOTE — Patient Instructions (Signed)
It was nice to see you today- I am glad that your girls are thriving!  I think your bruise if probably just "one of those things" but please let me know if you continue to have unusual bruising, or if you have any other bleeding such as nosebleeds. Also please alert me if you develop calf pain or swelling We will do routine labs for you today and I will be in touch with your reports asap

## 2020-06-05 NOTE — Progress Notes (Signed)
[facility] at [facility] [place], [place], [place] [address] [phone]  Date:  06/06/2020   Name:  [name]   DOB:  09-25-1965   MRN:  [id number]  PCP:  Darreld Mclean, MD    Chief Complaint: Arm Pain (right elbow and arm pain, burning, trouble sleeping, )   History of Present Illness:  [name] is a 54 y.o. very pleasant female patient who presents with the following:  Generally healthy woman with history of of uterine fibroids and DVT, here today with concern of shoulder pain  Last seen by myself in 2019 Colon cancer screening Pap smear Mammogram Flu vaccine COVID-19 vaccine Routine labs on chart from 2019  Today pt notes pain in her right shoulder for about 3 weeks-she notes having a similar problem several years ago, perhaps in 2015.  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  Her vision is normal, she is not sure if this is significant She is well overdue for a physical and routine visit, I scheduled her to see me in November   Patient Active Problem List   Diagnosis Date Noted  . Uterine fibroid 03/03/2016  . Fibroids, intramural 02/10/2016  . DVT (deep venous thrombosis) ([facility]) 08/01/2014    Past Medical History:  Diagnosis Date  . Anemia   . Chicken pox   . Left leg DVT ([facility]) 2014  . Uterine leiomyoma     Past Surgical History:  Procedure Laterality Date  . BUNIONECTOMY Bilateral 2007  . CESAREAN SECTION     x 2  .  ROBOTIC ASSISTED TOTAL HYSTERECTOMY WITH BILATERAL SALPINGO OOPHERECTOMY N/A 03/03/2016   Procedure: XI ROBOTIC ASSISTED TOTAL HYSTERECTOMY WITH BILATERAL Salipingectomy ;  Surgeon: Everitt Amber, MD;  Location: [facility];  Service: Gynecology;  Laterality: N/A;  . TUBAL LIGATION      Social History   Tobacco Use  . Smoking status: Current Every Day Smoker    Packs/day: 0.25    Years: 15.00    Pack years: 3.75    Types: Cigarettes  . Smokeless tobacco: Never Used  Substance Use Topics  . Alcohol use: Grant  . Drug use: Grant    Family History  Problem Relation Age of Onset  . Diabetes Mother   . Depression Mother   . Stroke Mother   . Diabetes Maternal Grandmother   . Heart disease Maternal Grandfather   . Tuberculosis Paternal Grandfather     Grant Known Allergies  Medication list has been reviewed and updated.  Current Outpatient Medications on File Prior to Visit  Medication Sig Dispense Refill  . Multiple Vitamin (MULTIVITAMIN WITH MINERALS) TABS tablet Take 1 tablet by mouth daily.     Grant current facility-administered medications on file prior to visit.    Review of Systems:  As per HPI- otherwise negative.   Physical Examination: Vitals:   06/06/20 1423  BP: 122/76  Pulse: 85  Resp:  16  SpO2: 99%   Vitals:   06/06/20 1423  Weight: 142 lb (64.4 kg)  Height: 5\' 6"  (1.676 m)   Body mass index is 22.92 kg/m. Ideal Body Weight: Weight in (lb) to have BMI = 25: 154.6  GEN: Grant acute distress. Petite build, looks well  HEENT: Atraumatic, Normocephalic.  Ears and Nose: Grant external deformity. CV: RRR, Grant M/G/R. Grant JVD. Grant thrill. Grant extra heart sounds. PULM: CTA B, Grant wheezes, crackles, rhonchi. Grant retractions. Grant resp. distress. Grant accessory muscle use. ABD: S, NT, ND, +BS. Grant rebound. Grant HSM. EXTR: Grant c/c/e PSYCH: Normally interactive. Conversant.  Mild right shoulder tenderness with external rotation Patient also complains of discomfort in the right elbow and  forearm muscles.  Normal range of motion and strength, Grant heat, swelling, redness is appreciated I examined both of her eyes closely, they appear normal to my exam   Assessment and Plan: Chronic right shoulder pain - Plan: Ambulatory referral to Orthopedic Surgery     Patient today with recurrent right shoulder pain.  She has seen orthopedics in the past and would like to return to see them.  I have made a referral for her to go back to [facility] to orthopedics.  She will let me know of any other problems in the meantime, declines a prescription for anti-inflammatories  I have reassured her that at this time I do not see any asymmetry with her eyes, but she will certainly let us know if this worsens or persists  Scheduled her for a physical in November This visit occurred during the SARS-CoV-2 public health emergency.  Safety protocols were in place, including screening questions prior to the visit, additional usage of staff PPE, and extensive cleaning of exam room while observing appropriate contact time as indicated for disinfecting solutions.     Signed Lamar Blinks, MD

## 2020-06-05 NOTE — Patient Instructions (Addendum)
It was nice to see you again today!  Please call [facility]/ [facility] and arrange an appt for your right shoulder   I will look forward to seeing you for your physical in November

## 2020-06-06 ENCOUNTER — Ambulatory Visit (INDEPENDENT_AMBULATORY_CARE_PROVIDER_SITE_OTHER): Payer: BC Managed Care – PPO | Admitting: Family Medicine

## 2020-06-06 ENCOUNTER — Encounter: Payer: Self-pay | Admitting: Family Medicine

## 2020-06-06 ENCOUNTER — Other Ambulatory Visit: Payer: Self-pay

## 2020-06-06 VITALS — BP 122/76 | HR 85 | Resp 16 | Ht 66.0 in | Wt 142.0 lb

## 2020-06-06 DIAGNOSIS — M25511 Pain in right shoulder: Secondary | ICD-10-CM | POA: Diagnosis not present

## 2020-06-06 DIAGNOSIS — G8929 Other chronic pain: Secondary | ICD-10-CM

## 2020-06-07 ENCOUNTER — Ambulatory Visit: Payer: Self-pay

## 2020-06-07 ENCOUNTER — Ambulatory Visit (INDEPENDENT_AMBULATORY_CARE_PROVIDER_SITE_OTHER): Payer: BC Managed Care – PPO | Admitting: Orthopaedic Surgery

## 2020-06-07 ENCOUNTER — Encounter: Payer: Self-pay | Admitting: Orthopaedic Surgery

## 2020-06-07 VITALS — Ht 66.0 in | Wt 145.0 lb

## 2020-06-07 DIAGNOSIS — G8929 Other chronic pain: Secondary | ICD-10-CM | POA: Diagnosis not present

## 2020-06-07 DIAGNOSIS — M25511 Pain in right shoulder: Secondary | ICD-10-CM | POA: Diagnosis not present

## 2020-06-07 MED ORDER — LIDOCAINE HCL 2 % IJ SOLN
2.0000 mL | INTRAMUSCULAR | Status: AC | PRN
  Administered 2020-06-07: 2 mL

## 2020-06-07 MED ORDER — LIDOCAINE 5 % EX PTCH: 1.0000 | MEDICATED_PATCH | CUTANEOUS | 0 refills | Status: DC

## 2020-06-07 MED ORDER — BUPIVACAINE HCL 0.25 % IJ SOLN
2.0000 mL | INTRAMUSCULAR | Status: AC | PRN
  Administered 2020-06-07: 2 mL via INTRA_ARTICULAR

## 2020-06-07 MED ORDER — METHYLPREDNISOLONE ACETATE 40 MG/ML IJ SUSP
40.0000 mg | INTRAMUSCULAR | Status: AC | PRN
  Administered 2020-06-07: 40 mg via INTRA_ARTICULAR

## 2020-06-07 MED ORDER — IBUPROFEN 800 MG PO TABS: 800.0000 mg | ORAL_TABLET | Freq: Three times a day (TID) | ORAL | 0 refills | Status: DC | PRN

## 2020-06-07 NOTE — Progress Notes (Signed)
Office Visit Note   Patient: [name]           Date of Birth: 08-28-1966           MRN: [id number] Visit Date: 06/07/2020              Requested by: Darreld Mclean, MD [address] [place],  [place] [address] PCP: Darreld Mclean, MD   Assessment & Plan: Visit Diagnoses:  1. Chronic right shoulder pain     Plan: Impression is left shoulder subacromial bursitis and rotator cuff tendinitis.  Due to the patient's previous symptoms and relief due to cortisone injection I feel that we could try another injection today.  We will proceed with subacromial cortisone injection.  Should her symptoms not improve she will come back in and see [place] and may look further into her neck at that point.  Call with concerns or questions.  Follow-Up Instructions: Return if symptoms worsen or fail to improve.   Orders:  Orders Placed This Encounter  Procedures  . Large Joint Inj: R subacromial bursa  . XR Shoulder Right   Meds ordered this encounter  Medications  . lidocaine (LIDODERM) 5 %    Sig: Place 1 patch onto the skin daily. Remove & Discard patch within 12 hours or as directed by MD    Dispense:  30 patch    Refill:  0  . ibuprofen (ADVIL) 800 MG tablet    Sig: Take 1 tablet (800 mg total) by mouth every 8 (eight) hours as needed.    Dispense:  60 tablet    Refill:  0      Procedures: Large Joint Inj: R subacromial bursa on 06/07/2020 10:36 AM Indications: pain Details: 22 G needle Medications: 2 mL lidocaine 2 %; 2 mL bupivacaine 0.25 %; 40 mg methylPREDNISolone acetate 40 MG/ML Outcome: tolerated well, no immediate complications Patient was prepped and draped in the usual sterile fashion.       Clinical Data: No additional findings.   Subjective: Chief Complaint  Patient presents with  . Right Shoulder - Pain    HPIpatient is a pleasant 54 year old female who comes in today with right shoulder pain for the past year but significantly worsened  over the past 3 weeks.  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  The pain is actually described as a burning sensation that runs from her shoulder through the deltoid and to the elbow.  Fixing hair as well as abduction and forward flexion of the shoulder seem to aggravate her symptoms most.  She has been using Tylenol, NSAIDs and lidocaine patches with relief up until about 3 weeks ago.  She notes that she had similar symptoms about 5 years ago which resolved with cortisone injection.  No history of neck pathology.  Review of Systems as detailed in HPI.  All others reviewed and are negative.   Objective: Vital Signs: Ht 5\' 6"  (1.676 m)   Wt 145 lb (65.8 kg)   LMP 03/01/2016   BMI 23.40 kg/m   Physical Exam well-developed well-nourished female no acute distress.  Alert oriented x3.  Ortho Exam examination of her right shoulder reveals full active range of motion all planes.  She does have a positive empty can.  Negative cross body adduction.  No focal weakness.  She is neurovascularly intact distally.  Specialty Comments:  No specialty comments available.  Imaging: XR  Shoulder Right  Result Date: 06/07/2020 No acute or structural abnormalities    PMFS History: Patient Active Problem List   Diagnosis Date Noted  . Uterine fibroid 03/03/2016  . Fibroids, intramural 02/10/2016  . DVT (deep venous thrombosis) ([facility]) 08/01/2014   Past Medical History:  Diagnosis Date  . Anemia   . Chicken pox   . Left leg DVT ([facility]) 2014  . Uterine leiomyoma     Family History  Problem Relation Age of Onset  . Diabetes Mother   . Depression Mother   . Stroke Mother   . Diabetes Maternal Grandmother   . Heart disease Maternal Grandfather   . Tuberculosis Paternal Grandfather     Past Surgical History:  Procedure Laterality Date  . BUNIONECTOMY Bilateral 2007  . CESAREAN SECTION     x 2  . ROBOTIC ASSISTED TOTAL HYSTERECTOMY  WITH BILATERAL SALPINGO OOPHERECTOMY N/A 03/03/2016   Procedure: XI ROBOTIC ASSISTED TOTAL HYSTERECTOMY WITH BILATERAL Salipingectomy ;  Surgeon: Everitt Amber, MD;  Location: [facility];  Service: Gynecology;  Laterality: N/A;  . TUBAL LIGATION     Social History   Occupational History  . Not on file  Tobacco Use  . Smoking status: Current Every Day Smoker    Packs/day: 0.25    Years: 15.00    Pack years: 3.75    Types: Cigarettes  . Smokeless tobacco: Never Used  Substance and Sexual Activity  . Alcohol use: No  . Drug use: No  . Sexual activity: Not on file

## 2020-07-13 NOTE — Progress Notes (Deleted)
[facility] at [facility] [address] [place], [place] [address] [phone]  Date:  07/15/2020   Name:  [name]   DOB:  01/27/1966   MRN:  [id number]  PCP:  Darreld Mclean, MD    Chief Complaint: No chief complaint on file.   History of Present Illness:  [name] is a 54 y.o. very pleasant female patient who presents with the following:  Here today for a CPE Last seen by myself in September with shoulder pain  S/p hysterectomy History of DVT Smoking:  mammo Pap HIV, hep C Colon cancer screening Tetanus Flu covid vaccine done Due for labs today   Patient Active Problem List   Diagnosis Date Noted  . Uterine fibroid 03/03/2016  . Fibroids, intramural 02/10/2016  . DVT (deep venous thrombosis) ([facility]) 08/01/2014    Past Medical History:  Diagnosis Date  . Anemia   . Chicken pox   . Left leg DVT ([facility]) 2014  . Uterine leiomyoma     Past Surgical History:  Procedure Laterality Date  . BUNIONECTOMY Bilateral 2007  . CESAREAN SECTION     x 2  . ROBOTIC ASSISTED TOTAL HYSTERECTOMY WITH BILATERAL SALPINGO OOPHERECTOMY N/A 03/03/2016   Procedure: XI ROBOTIC ASSISTED TOTAL HYSTERECTOMY WITH BILATERAL Salipingectomy ;  Surgeon: Everitt Amber, MD;  Location: [facility];  Service: Gynecology;  Laterality: N/A;  . TUBAL LIGATION      Social History   Tobacco Use  . Smoking status: Current Every Day Smoker    Packs/day: 0.25    Years: 15.00    Pack years: 3.75    Types: Cigarettes  . Smokeless tobacco: Never Used  Substance Use Topics  . Alcohol use: No  . Drug use: No    Family History  Problem Relation Age of Onset  . Diabetes Mother   . Depression Mother   . Stroke Mother   . Diabetes Maternal Grandmother   . Heart disease Maternal Grandfather   . Tuberculosis Paternal Grandfather     No Known Allergies  Medication list has been reviewed and updated.  Current Outpatient Medications on File  Prior to Visit  Medication Sig Dispense Refill  . ibuprofen (ADVIL) 800 MG tablet Take 1 tablet (800 mg total) by mouth every 8 (eight) hours as needed. 60 tablet 0  . lidocaine (LIDODERM) 5 % Place 1 patch onto the skin daily. Remove & Discard patch within 12 hours or as directed by MD 30 patch 0  . Multiple Vitamin (MULTIVITAMIN WITH MINERALS) TABS tablet Take 1 tablet by mouth daily.     No current facility-administered medications on file prior to visit.    Review of Systems:  As per HPI- otherwise negative.   Physical Examination: There were no vitals filed for this visit. There were no vitals filed for this visit. There is no height or weight on file to calculate BMI. Ideal Body Weight:    GEN: no acute distress. HEENT: Atraumatic, Normocephalic.  Ears and Nose: No external deformity. CV: RRR, No M/G/R. No JVD. No thrill. No extra heart sounds. PULM: CTA B, no wheezes, crackles, rhonchi. No retractions. No resp. distress. No accessory muscle use. ABD: S, NT, ND, +BS. No rebound. No HSM. EXTR: No c/c/e PSYCH: Normally interactive. Conversant.    Assessment and Plan: *** This visit occurred during the SARS-CoV-2 public health emergency.  Safety protocols were in place, including screening questions prior to the visit, additional usage  of staff PPE, and extensive cleaning of exam room while observing appropriate contact time as indicated for disinfecting solutions.    Signed Lamar Blinks, MD

## 2020-07-15 ENCOUNTER — Ambulatory Visit (INDEPENDENT_AMBULATORY_CARE_PROVIDER_SITE_OTHER): Payer: BC Managed Care – PPO | Admitting: Family Medicine

## 2020-07-15 ENCOUNTER — Encounter: Payer: Self-pay | Admitting: Family Medicine

## 2020-07-15 DIAGNOSIS — Z1159 Encounter for screening for other viral diseases: Secondary | ICD-10-CM

## 2020-07-15 DIAGNOSIS — Z13220 Encounter for screening for lipoid disorders: Secondary | ICD-10-CM

## 2020-07-15 DIAGNOSIS — Z131 Encounter for screening for diabetes mellitus: Secondary | ICD-10-CM

## 2020-07-15 DIAGNOSIS — Z0000 Encounter for general adult medical examination without abnormal findings: Secondary | ICD-10-CM

## 2020-07-15 DIAGNOSIS — Z1329 Encounter for screening for other suspected endocrine disorder: Secondary | ICD-10-CM

## 2020-07-15 DIAGNOSIS — Z1321 Encounter for screening for nutritional disorder: Secondary | ICD-10-CM

## 2020-07-15 DIAGNOSIS — Z130 Encounter for screening for diseases of the blood and blood-forming organs and certain disorders involving the immune mechanism: Secondary | ICD-10-CM

## 2020-07-15 DIAGNOSIS — Z114 Encounter for screening for human immunodeficiency virus [HIV]: Secondary | ICD-10-CM

## 2020-07-16 NOTE — Progress Notes (Deleted)
[place] at [facility] [address], [place], [place] [address] [phone]  Date:  07/18/2020   Name:  [name]   DOB:  April 01, 1966   MRN:  [id number]  PCP:  Darreld Mclean, MD    Chief Complaint: No chief complaint on file.   History of Present Illness:  [name] is a 54 y.o. 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 Uterine fibroid 03/03/2016  . Fibroids, intramural 02/10/2016  . DVT (deep venous thrombosis) ([facility]) 08/01/2014    Past Medical History:  Diagnosis Date  . Anemia   . Chicken pox   . Left leg DVT ([facility]) 2014  . Uterine leiomyoma     Past Surgical History:  Procedure Laterality Date  . BUNIONECTOMY Bilateral 2007  . CESAREAN SECTION     x 2  . ROBOTIC ASSISTED TOTAL HYSTERECTOMY WITH BILATERAL SALPINGO OOPHERECTOMY N/A 03/03/2016   Procedure: XI ROBOTIC ASSISTED TOTAL HYSTERECTOMY WITH BILATERAL Salipingectomy ;  Surgeon: Everitt Amber, MD;  Location: [facility];  Service: Gynecology;  Laterality: N/A;  . TUBAL LIGATION      Social History   Tobacco Use  . Smoking status: Current Every Day Smoker    Packs/day: 0.25    Years: 15.00    Pack years: 3.75    Types: Cigarettes  . Smokeless tobacco: Never Used  Substance Use Topics  . Alcohol use: No  . Drug use: No    Family History  Problem Relation Age of Onset  . Diabetes Mother   . Depression Mother   . Stroke Mother   . Diabetes Maternal Grandmother   . Heart disease Maternal Grandfather   . Tuberculosis Paternal Grandfather     No Known Allergies  Medication list has been reviewed and updated.  Current Outpatient  Medications on File Prior to Visit  Medication Sig Dispense Refill  . ibuprofen (ADVIL) 800 MG tablet Take 1 tablet (800 mg total) by mouth every 8 (eight) hours as needed. 60 tablet 0  . lidocaine (LIDODERM) 5 % Place 1 patch onto the skin daily. Remove & Discard patch within 12 hours or as directed by MD 30 patch 0  . Multiple Vitamin (MULTIVITAMIN WITH MINERALS) TABS tablet Take 1 tablet by mouth daily.     No current facility-administered medications on file prior to visit.    Review of Systems:  As per HPI- otherwise negative.   Physical Examination: There were no vitals filed for this visit. There were no vitals filed for this visit. There is no height or weight on file to calculate BMI. Ideal Body Weight:    GEN: no acute distress. HEENT: Atraumatic, Normocephalic.  Ears and Nose: No external deformity. CV: RRR, No M/G/R. No JVD. No thrill. No extra heart sounds. PULM: CTA B, no wheezes, crackles, rhonchi. No retractions. No resp. distress. No accessory muscle use. ABD: S, NT, ND, +BS. No rebound. No HSM. EXTR: No c/c/e PSYCH: Normally interactive. Conversant.    Assessment and Plan: *** This visit occurred during the SARS-CoV-2 public health emergency.  Safety protocols were in place, including screening questions prior  to the visit, additional usage of staff PPE, and extensive cleaning of exam room while observing appropriate contact time as indicated for disinfecting solutions.    Signed Lamar Blinks, MD

## 2020-07-18 ENCOUNTER — Ambulatory Visit (INDEPENDENT_AMBULATORY_CARE_PROVIDER_SITE_OTHER): Payer: BC Managed Care – PPO | Admitting: Family Medicine

## 2020-07-18 DIAGNOSIS — Z1159 Encounter for screening for other viral diseases: Secondary | ICD-10-CM

## 2020-07-18 DIAGNOSIS — Z1329 Encounter for screening for other suspected endocrine disorder: Secondary | ICD-10-CM | POA: Diagnosis not present

## 2020-07-18 DIAGNOSIS — Z130 Encounter for screening for diseases of the blood and blood-forming organs and certain disorders involving the immune mechanism: Secondary | ICD-10-CM

## 2020-07-18 DIAGNOSIS — Z1321 Encounter for screening for nutritional disorder: Secondary | ICD-10-CM

## 2020-07-18 DIAGNOSIS — Z0000 Encounter for general adult medical examination without abnormal findings: Secondary | ICD-10-CM | POA: Diagnosis not present

## 2020-07-18 DIAGNOSIS — Z131 Encounter for screening for diabetes mellitus: Secondary | ICD-10-CM | POA: Diagnosis not present

## 2020-07-18 DIAGNOSIS — Z13220 Encounter for screening for lipoid disorders: Secondary | ICD-10-CM

## 2020-07-19 NOTE — Patient Instructions (Addendum)
Good to see you again today- I will be in touch with your labs We can talk about Shingrix again at our next visit We will order a Cologuard kit for colon cancer screening for you  Work on quitting smoking and take care!    Health Maintenance, Female Adopting a healthy lifestyle and getting preventive care are important in promoting health and wellness. Ask your health care provider about:  The right schedule for you to have regular tests and exams.  Things you can do on your own to prevent diseases and keep yourself healthy. What should I know about diet, weight, and exercise? Eat a healthy diet   Eat a diet that includes plenty of vegetables, fruits, low-fat dairy products, and lean protein.  Do not eat a lot of foods that are high in solid fats, added sugars, or sodium. Maintain a healthy weight Body mass index (BMI) is used to identify weight problems. It estimates body fat based on height and weight. Your health care provider can help determine your BMI and help you achieve or maintain a healthy weight. Get regular exercise Get regular exercise. This is one of the most important things you can do for your health. Most adults should:  Exercise for at least 150 minutes each week. The exercise should increase your heart rate and make you sweat (moderate-intensity exercise).  Do strengthening exercises at least twice a week. This is in addition to the moderate-intensity exercise.  Spend less time sitting. Even light physical activity can be beneficial. Watch cholesterol and blood lipids Have your blood tested for lipids and cholesterol at 54 years of age, then have this test every 5 years. Have your cholesterol levels checked more often if:  Your lipid or cholesterol levels are high.  You are older than 54 years of age.  You are at high risk for heart disease. What should I know about cancer screening? Depending on your health history and family history, you may need to have  cancer screening at various ages. This may include screening for:  Breast cancer.  Cervical cancer.  Colorectal cancer.  Skin cancer.  Lung cancer. What should I know about heart disease, diabetes, and high blood pressure? Blood pressure and heart disease  High blood pressure causes heart disease and increases the risk of stroke. This is more likely to develop in people who have high blood pressure readings, are of African descent, or are overweight.  Have your blood pressure checked: ? Every 3-5 years if you are 86-8 years of age. ? Every year if you are 76 years old or older. Diabetes Have regular diabetes screenings. This checks your fasting blood sugar level. Have the screening done:  Once every three years after age 78 if you are at a normal weight and have a low risk for diabetes.  More often and at a younger age if you are overweight or have a high risk for diabetes. What should I know about preventing infection? Hepatitis B If you have a higher risk for hepatitis B, you should be screened for this virus. Talk with your health care provider to find out if you are at risk for hepatitis B infection. Hepatitis C Testing is recommended for:  Everyone born from [birth year] through 1965.  Anyone with known risk factors for hepatitis C. Sexually transmitted infections (STIs)  Get screened for STIs, including gonorrhea and chlamydia, if: ? You are sexually active and are younger than 54 years of age. ? You are older than 54  years of age and your health care provider tells you that you are at risk for this type of infection. ? Your sexual activity has changed since you were last screened, and you are at increased risk for chlamydia or gonorrhea. Ask your health care provider if you are at risk.  Ask your health care provider about whether you are at high risk for HIV. Your health care provider may recommend a prescription medicine to help prevent HIV infection. If you choose to take  medicine to prevent HIV, you should first get tested for HIV. You should then be tested every 3 months for as long as you are taking the medicine. Pregnancy  If you are about to stop having your period (premenopausal) and you may become pregnant, seek counseling before you get pregnant.  Take 400 to 800 micrograms (mcg) of folic acid every day if you become pregnant.  Ask for birth control (contraception) if you want to prevent pregnancy. Osteoporosis and menopause Osteoporosis is a disease in which the bones lose minerals and strength with aging. This can result in bone fractures. If you are 62 years old or older, or if you are at risk for osteoporosis and fractures, ask your health care provider if you should:  Be screened for bone loss.  Take a calcium or vitamin D supplement to lower your risk of fractures.  Be given hormone replacement therapy (HRT) to treat symptoms of menopause. Follow these instructions at home: Lifestyle  Do not use any products that contain nicotine or tobacco, such as cigarettes, e-cigarettes, and chewing tobacco. If you need help quitting, ask your health care provider.  Do not use street drugs.  Do not share needles.  Ask your health care provider for help if you need support or information about quitting drugs. Alcohol use  Do not drink alcohol if: ? Your health care provider tells you not to drink. ? You are pregnant, may be pregnant, or are planning to become pregnant.  If you drink alcohol: ? Limit how much you use to 0-1 drink a day. ? Limit intake if you are breastfeeding.  Be aware of how much alcohol is in your drink. In the [place] one drink equals one 12 oz bottle of beer (355 mL), one 5 oz glass of wine (148 mL), or one 1 oz glass of hard liquor (44 mL). General instructions  Schedule regular health, dental, and eye exams.  Stay current with your vaccines.  Tell your health care provider if: ? You often feel depressed. ? You have  ever been abused or do not feel safe at home. Summary  Adopting a healthy lifestyle and getting preventive care are important in promoting health and wellness.  Follow your health care provider's instructions about healthy diet, exercising, and getting tested or screened for diseases.  Follow your health care provider's instructions on monitoring your cholesterol and blood pressure. This information is not intended to replace advice given to you by your health care provider. Make sure you discuss any questions you have with your health care provider. Document Revised: 08/17/2018 Document Reviewed: 08/17/2018 Elsevier Patient Education  2020 [employer].

## 2020-07-19 NOTE — Progress Notes (Addendum)
[place] at [facility] [address], [place], [place] [address] [phone]  Date:  07/29/2020   Name:  [name]   DOB:  1966/05/16   MRN:  [id number]  PCP:  Darreld Mclean, MD    Chief Complaint: Annual Exam (sees gyn)   History of Present Illness:  [name] is a 54 y.o. very pleasant female patient who presents with the following:  Here today for a CPE History of DVT, uterine fibroids Last seen by myself in September with shoulder pain - she had injections and then an MRI as she did not improve.  She just had her MRI per Dr Cooper Render does not get discussed with orthopedics.  We went over her MRI of the best my ability today.  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 She also does work as a Cabin crew and is working on transition out of the [employer]- per Dr Denman George Colon not on file- she would like to do a cologuard.  No family or personal history of colon cancer, no colon symptoms such as bleeding Pap due- per her GYN Denman George Flu- pt declines today tdap- booster today Hep C screening today Labs are due- she is fasting this am  covid done and booster  She is exercising on a regular basis No CP or SOB She is s/p hysterectomy   Patient Active Problem List   Diagnosis Date Noted  . Uterine fibroid 03/03/2016  . Fibroids, intramural 02/10/2016  . DVT (deep venous thrombosis) ([place]) 08/01/2014    Past Medical History:  Diagnosis Date  . Anemia   . Chicken pox   . Left leg DVT ([place]) 2014  . Uterine leiomyoma     Past Surgical History:  Procedure Laterality Date  . BUNIONECTOMY Bilateral 2007  . CESAREAN SECTION     x 2  . ROBOTIC ASSISTED TOTAL HYSTERECTOMY WITH BILATERAL SALPINGO OOPHERECTOMY N/A 03/03/2016   Procedure:  XI ROBOTIC ASSISTED TOTAL HYSTERECTOMY WITH BILATERAL Salipingectomy ;  Surgeon: Everitt Amber, MD;  Location: [facility];  Service: Gynecology;  Laterality: N/A;  . TUBAL LIGATION      Social History   Tobacco Use  . Smoking status: Current Every Day Smoker    Packs/day: 0.25    Years: 15.00    Pack years: 3.75    Types: Cigarettes  . Smokeless tobacco: Never Used  Substance Use Topics  . Alcohol use: No  . Drug use: No    Family History  Problem Relation Age of Onset  . Diabetes Mother   . Depression Mother   . Stroke Mother   . Diabetes Maternal Grandmother   . Heart disease Maternal Grandfather   . Tuberculosis Paternal Grandfather     No Known Allergies  Medication list has been reviewed and updated.  Current Outpatient Medications on File Prior to Visit  Medication Sig Dispense Refill  . ibuprofen (ADVIL) 800 MG tablet Take 1 tablet (800 mg total) by mouth every 8 (eight) hours as needed. 60 tablet 0  . lidocaine (LIDODERM) 5 % Place 1 patch onto the skin daily. Remove & Discard patch within 12 hours or as directed by MD 30 patch 0  . Multiple Vitamin (MULTIVITAMIN WITH MINERALS) TABS tablet Take 1 tablet  by mouth daily.     No current facility-administered medications on file prior to visit.    Review of Systems:  As per HPI- otherwise negative.   Physical Examination: Vitals:   07/29/20 1008 07/29/20 1031  BP: (!) 122/98 110/70  Pulse: 78   Resp: 16   SpO2: 98%    Vitals:   07/29/20 1008  Weight: 149 lb (67.6 kg)  Height: 5\' 6"  (1.676 m)   Body mass index is 24.05 kg/m. Ideal Body Weight: Weight in (lb) to have BMI = 25: 154.6  GEN: no acute distress.  Looks well, normal weight HEENT: Atraumatic, Normocephalic.  Ears and Nose: No external deformity. CV: RRR, No M/G/R. No JVD. No thrill. No extra heart sounds. PULM: CTA B, no wheezes, crackles, rhonchi. No retractions. No resp. distress. No accessory muscle use. ABD: S, NT, ND, +BS. No rebound. No  HSM. EXTR: No c/c/e PSYCH: Normally interactive. Conversant.    Assessment and Plan: Physical exam  Screening for deficiency anemia - Plan: CBC  Screening for diabetes mellitus - Plan: Comprehensive metabolic panel, Hemoglobin A1c  Encounter for hepatitis C screening test for low risk patient - Plan: Hepatitis C antibody  Screening for thyroid disorder - Plan: TSH  Screening for hyperlipidemia - Plan: Lipid panel  Encounter for vitamin deficiency screening - Plan: VITAMIN D 25 Hydroxy (Vit-D Deficiency, Fractures)  Fatigue, unspecified type - Plan: TSH, VITAMIN D 25 Hydroxy (Vit-D Deficiency, Fractures)  Immunization due - Plan: Tdap vaccine greater than or equal to 7yo IM  Light smoker   Patient today for physical exam She is doing well in general, but is bothered by persistent right shoulder pain.  This has been a problem for greater than a year.  I encouraged her to follow with orthopedics about recent MRI Routine labs are pending as above Update tetanus Will order Cologuard to screen for colon cancer Will plan further follow- up pending labs. She is a light smoker, encouraged her to work on quitting This visit occurred during the SARS-CoV-2 public health emergency.  Safety protocols were in place, including screening questions prior to the visit, additional usage of staff PPE, and extensive cleaning of exam room while observing appropriate contact time as indicated for disinfecting solutions.    Signed Lamar Blinks, MD  Received her labs as below, letter to patient Called patient and let her know I will prescribe vitamin D supplement Results for orders placed or performed in visit on 07/29/20  CBC  Result Value Ref Range   WBC 3.1 (L) 4.0 - 10.5 K/uL   RBC 4.52 3.87 - 5.11 Mil/uL   Platelets 237.0 150 - 400 K/uL   Hemoglobin 13.7 12.0 - 15.0 g/dL   HCT 40.8 36 - 46 %   MCV 90.4 78.0 - 100.0 fl   MCHC 33.5 30.0 - 36.0 g/dL   RDW 14.8 11.5 - 15.5 %   Comprehensive metabolic panel  Result Value Ref Range   Sodium 138 135 - 145 mEq/L   Potassium 4.4 3.5 - 5.1 mEq/L   Chloride 101 96 - 112 mEq/L   CO2 29 19 - 32 mEq/L   Glucose, Bld 86 70 - 99 mg/dL   BUN 17 6 - 23 mg/dL   Creatinine, Ser 0.81 0.40 - 1.20 mg/dL   Total Bilirubin 0.4 0.2 - 1.2 mg/dL   Alkaline Phosphatase 68 39 - 117 U/L   AST 13 0 - 37 U/L   ALT 9 0 - 35 U/L  Total Protein 7.5 6.0 - 8.3 g/dL   Albumin 4.2 3.5 - 5.2 g/dL   GFR 82.52 >60.00 mL/min   Calcium 9.7 8.4 - 10.5 mg/dL  Hemoglobin A1c  Result Value Ref Range   Hgb A1c MFr Bld 6.3 4.6 - 6.5 %  Lipid panel  Result Value Ref Range   Cholesterol 246 (H) 0 - 200 mg/dL   Triglycerides 66.0 0 - 149 mg/dL   HDL 71.50 >39.00 mg/dL   VLDL 13.2 0.0 - 40.0 mg/dL   LDL Cholesterol 162 (H) 0 - 99 mg/dL   Total CHOL/HDL Ratio 3    NonHDL 174.87   TSH  Result Value Ref Range   TSH 1.14 0.35 - 4.50 uIU/mL  VITAMIN D 25 Hydroxy (Vit-D Deficiency, Fractures)  Result Value Ref Range   VITD 23.43 (L) 30.00 - 100.00 ng/mL

## 2020-07-23 ENCOUNTER — Encounter: Payer: Self-pay | Admitting: Orthopaedic Surgery

## 2020-07-23 ENCOUNTER — Ambulatory Visit (INDEPENDENT_AMBULATORY_CARE_PROVIDER_SITE_OTHER): Payer: BC Managed Care – PPO | Admitting: Orthopaedic Surgery

## 2020-07-23 DIAGNOSIS — M25511 Pain in right shoulder: Secondary | ICD-10-CM

## 2020-07-23 DIAGNOSIS — G8929 Other chronic pain: Secondary | ICD-10-CM | POA: Diagnosis not present

## 2020-07-23 MED ORDER — TRAMADOL HCL 50 MG PO TABS
50.0000 mg | ORAL_TABLET | Freq: Three times a day (TID) | ORAL | 0 refills | Status: DC | PRN
Start: 2020-07-23 — End: 2020-07-29

## 2020-07-23 MED ORDER — IBUPROFEN 800 MG PO TABS
800.0000 mg | ORAL_TABLET | Freq: Three times a day (TID) | ORAL | 0 refills | Status: DC | PRN
Start: 2020-07-23 — End: 2020-11-29

## 2020-07-23 NOTE — Progress Notes (Signed)
Office Visit Note   Patient: [name]           Date of Birth: [birth year]           MRN: [id number] Visit Date: 07/23/2020              Requested by: Darreld Mclean, MD [address] [place],  [place] [address] PCP: Darreld Mclean, MD   Assessment & Plan: Visit Diagnoses:  1. Chronic right shoulder pain     Plan: Impression is chronic right shoulder pain.  Due to the patient's temporary response to cortisone injection I believe her symptoms are primarily coming from her shoulder and not her neck.  We will obtain an MRI of the right shoulder at this point to assess for structural abnormalities.  She will follow up with [place] once that has been completed.  Follow-Up Instructions: Return for after MRI.   Orders:  No orders of the defined types were placed in this encounter.  Meds ordered this encounter  Medications  . traMADol (ULTRAM) 50 MG tablet    Sig: Take 1 tablet (50 mg total) by mouth 3 (three) times daily as needed.    Dispense:  30 tablet    Refill:  0  . ibuprofen (ADVIL) 800 MG tablet    Sig: Take 1 tablet (800 mg total) by mouth every 8 (eight) hours as needed.    Dispense:  60 tablet    Refill:  0      Procedures: No procedures performed   Clinical Data: No additional findings.   Subjective: No chief complaint on file.   HPI patient is a pleasant 54 year old female who comes in today with recurrent right shoulder pain for over 2 months.  No specific injury or change in activity.  She was seen in our office about 6 weeks ago where subacromial cortisone injection was performed.  She had good relief of symptoms but only lasted for about a week.  Her pain is returned and is starting to worsen.  Her pain is to the entire shoulder and radiates to the deltoid and into the elbow at times.  She has increased pain with movement of the shoulder to include external and internal rotation as well as forward flexion.  She also notes an occasional  burn down her arm.    Review of Systems as detailed in HPI.  All others reviewed and are negative.   Objective: Vital Signs: LMP 03/01/2016   Physical Exam well-developed well-nourished female no acute distress.  Alert and oriented x3.  Ortho Exam right shoulder exam shows near full active range of motion all planes.  She does have pain and slight limitation with internal rotation.  She can get to her back pocket.  She has a positive empty can test.  Negative cross body adduction.  4 out of 5 strength throughout.  She is neurovascular intact distally.  Specialty Comments:  No specialty comments available.  Imaging: No new imaging   PMFS History: Patient Active Problem List   Diagnosis Date Noted  . Uterine fibroid 03/03/2016  . Fibroids, intramural 02/10/2016  . DVT (deep venous thrombosis) ([place]) 08/01/2014   Past Medical History:  Diagnosis Date  . Anemia   . Chicken pox   . Left leg DVT ([place]) 2014  . Uterine leiomyoma     Family History  Problem Relation Age of Onset  . Diabetes Mother   . Depression Mother   . Stroke Mother   .  Diabetes Maternal Grandmother   . Heart disease Maternal Grandfather   . Tuberculosis Paternal Grandfather     Past Surgical History:  Procedure Laterality Date  . BUNIONECTOMY Bilateral 2007  . CESAREAN SECTION     x 2  . ROBOTIC ASSISTED TOTAL HYSTERECTOMY WITH BILATERAL SALPINGO OOPHERECTOMY N/A 03/03/2016   Procedure: XI ROBOTIC ASSISTED TOTAL HYSTERECTOMY WITH BILATERAL Salipingectomy ;  Surgeon: Everitt Amber, MD;  Location: [facility];  Service: Gynecology;  Laterality: N/A;  . TUBAL LIGATION     Social History   Occupational History  . Not on file  Tobacco Use  . Smoking status: Current Every Day Smoker    Packs/day: 0.25    Years: 15.00    Pack years: 3.75    Types: Cigarettes  . Smokeless tobacco: Never Used  Substance and Sexual Activity  . Alcohol use: No  . Drug use: No  . Sexual activity: Not on file

## 2020-07-27 ENCOUNTER — Ambulatory Visit
Admission: RE | Admit: 2020-07-27 | Discharge: 2020-07-27 | Disposition: A | Discharge status: Home / Self Care | Payer: BC Managed Care – PPO | Source: Ambulatory Visit | Attending: Orthopaedic Surgery | Admitting: Orthopaedic Surgery

## 2020-07-27 DIAGNOSIS — G8929 Other chronic pain: Secondary | ICD-10-CM

## 2020-07-29 ENCOUNTER — Encounter: Payer: Self-pay | Admitting: Family Medicine

## 2020-07-29 ENCOUNTER — Other Ambulatory Visit: Payer: Self-pay

## 2020-07-29 ENCOUNTER — Ambulatory Visit (INDEPENDENT_AMBULATORY_CARE_PROVIDER_SITE_OTHER): Payer: BC Managed Care – PPO | Admitting: Family Medicine

## 2020-07-29 VITALS — BP 110/70 | HR 78 | Resp 16 | Ht 66.0 in | Wt 149.0 lb

## 2020-07-29 DIAGNOSIS — Z1321 Encounter for screening for nutritional disorder: Secondary | ICD-10-CM | POA: Diagnosis not present

## 2020-07-29 DIAGNOSIS — Z131 Encounter for screening for diabetes mellitus: Secondary | ICD-10-CM | POA: Diagnosis not present

## 2020-07-29 DIAGNOSIS — Z23 Encounter for immunization: Secondary | ICD-10-CM | POA: Diagnosis not present

## 2020-07-29 DIAGNOSIS — Z130 Encounter for screening for diseases of the blood and blood-forming organs and certain disorders involving the immune mechanism: Secondary | ICD-10-CM

## 2020-07-29 DIAGNOSIS — Z1329 Encounter for screening for other suspected endocrine disorder: Secondary | ICD-10-CM | POA: Diagnosis not present

## 2020-07-29 DIAGNOSIS — R5383 Other fatigue: Secondary | ICD-10-CM

## 2020-07-29 DIAGNOSIS — Z0000 Encounter for general adult medical examination without abnormal findings: Secondary | ICD-10-CM | POA: Diagnosis not present

## 2020-07-29 DIAGNOSIS — Z13220 Encounter for screening for lipoid disorders: Secondary | ICD-10-CM | POA: Diagnosis not present

## 2020-07-29 DIAGNOSIS — E559 Vitamin D deficiency, unspecified: Secondary | ICD-10-CM

## 2020-07-29 DIAGNOSIS — Z1159 Encounter for screening for other viral diseases: Secondary | ICD-10-CM

## 2020-07-29 DIAGNOSIS — D72819 Decreased white blood cell count, unspecified: Secondary | ICD-10-CM

## 2020-07-29 DIAGNOSIS — F17210 Nicotine dependence, cigarettes, uncomplicated: Secondary | ICD-10-CM

## 2020-07-29 LAB — COMPREHENSIVE METABOLIC PANEL
ALT: 9 U/L (ref 0–35)
AST: 13 U/L (ref 0–37)
Albumin: 4.2 g/dL (ref 3.5–5.2)
Alkaline Phosphatase: 68 U/L (ref 39–117)
BUN: 17 mg/dL (ref 6–23)
CO2: 29 mEq/L (ref 19–32)
Calcium: 9.7 mg/dL (ref 8.4–10.5)
Chloride: 101 mEq/L (ref 96–112)
Creatinine, Ser: 0.81 mg/dL (ref 0.40–1.20)
GFR: 82.52 mL/min (ref >60.00)
Glucose, Bld: 86 mg/dL (ref 70–99)
Potassium: 4.4 mEq/L (ref 3.5–5.1)
Sodium: 138 mEq/L (ref 135–145)
Total Bilirubin: 0.4 mg/dL (ref 0.2–1.2)
Total Protein: 7.5 g/dL (ref 6.0–8.3)

## 2020-07-29 LAB — CBC
HCT: 40.8 % (ref 36.0–46.0)
Hemoglobin: 13.7 g/dL (ref 12.0–15.0)
MCHC: 33.5 g/dL (ref 30.0–36.0)
MCV: 90.4 fl (ref 78.0–100.0)
Platelets: 237 10*3/uL (ref 150.0–400.0)
RBC: 4.52 Mil/uL (ref 3.87–5.11)
RDW: 14.8 % (ref 11.5–15.5)
WBC: 3.1 10*3/uL — ABNORMAL LOW (ref 4.0–10.5)

## 2020-07-29 LAB — TSH: TSH: 1.14 u[IU]/mL (ref 0.35–4.50)

## 2020-07-29 LAB — LIPID PANEL
Cholesterol: 246 mg/dL — ABNORMAL HIGH (ref 0–200)
HDL: 71.5 mg/dL (ref >39.00)
LDL Cholesterol: 162 mg/dL — ABNORMAL HIGH (ref 0–99)
NonHDL: 174.87
Total CHOL/HDL Ratio: 3
Triglycerides: 66 mg/dL (ref 0.0–149.0)
VLDL: 13.2 mg/dL (ref 0.0–40.0)

## 2020-07-29 LAB — VITAMIN D 25 HYDROXY (VIT D DEFICIENCY, FRACTURES): VITD: 23.43 ng/mL — ABNORMAL LOW (ref 30.00–100.00)

## 2020-07-29 LAB — HEMOGLOBIN A1C: Hgb A1c MFr Bld: 6.3 % (ref 4.6–6.5)

## 2020-07-29 MED ORDER — VITAMIN D3 1.25 MG (50000 UT) PO CAPS: ORAL_CAPSULE | ORAL | 0 refills | Status: DC

## 2020-07-29 NOTE — Addendum Note (Signed)
Addended by: Lamar Blinks C on: 07/29/2020 04:11 PM   Modules accepted: Orders

## 2020-07-29 NOTE — Progress Notes (Signed)
Cologuard ordered for patient.  

## 2020-07-30 LAB — HEPATITIS C ANTIBODY
Hepatitis C Ab: NONREACTIVE
SIGNAL TO CUT-OFF: 0.02 (ref <1.00)

## 2020-07-31 ENCOUNTER — Ambulatory Visit (INDEPENDENT_AMBULATORY_CARE_PROVIDER_SITE_OTHER): Payer: BC Managed Care – PPO | Admitting: Orthopaedic Surgery

## 2020-07-31 ENCOUNTER — Encounter: Payer: Self-pay | Admitting: Orthopaedic Surgery

## 2020-07-31 DIAGNOSIS — M7541 Impingement syndrome of right shoulder: Secondary | ICD-10-CM | POA: Diagnosis not present

## 2020-07-31 NOTE — Progress Notes (Signed)
Office Visit Note   Patient: [name]           Date of Birth: Nov 29, 1965           MRN: [id number] Visit Date: 07/31/2020              Requested by: Darreld Mclean, MD [address] [place],  [place] [address] PCP: Darreld Mclean, MD   Assessment & Plan: Visit Diagnoses:  1. Rotator cuff impingement syndrome of right shoulder     Plan: MRI results were reviewed with the patient in detail which show mild to moderate tendinosis of the supraspinatus and infraspinatus with a small partial-thickness bursal tear.  She has a type II acromion.  Small amount of subacromial bursitis.  Given these findings and based on our discussion of treatment options to include continued conservative treatment versus arthroscopic debridement and subacromial decompression the patient has elected to move forward with surgical treatment.  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  Risk benefits rehab recovery reviewed with the patient in detail.  Follow-Up Instructions: Return if symptoms worsen or fail to improve.   Orders:  No orders of the defined types were placed in this encounter.  No orders of the defined types were placed in this encounter.     Procedures: No procedures performed   Clinical Data: No additional findings.   Subjective: Chief Complaint  Patient presents with  . Right Shoulder - Pain    [name] is here today for right shoulder MRI review.  No changes in medical history.   Review of Systems   Objective: Vital Signs: LMP 03/01/2016   Physical Exam  Ortho Exam Right shoulder exam is stable. Specialty Comments:  No specialty comments available.  Imaging: No results found.   PMFS History: Patient Active Problem List   Diagnosis Date Noted  . Rotator cuff  impingement syndrome of right shoulder 07/31/2020  . Uterine fibroid 03/03/2016  . Fibroids, intramural 02/10/2016  . DVT (deep venous thrombosis) ([place]) 08/01/2014   Past Medical History:  Diagnosis Date  . Anemia   . Chicken pox   . Left leg DVT ([place]) 2014  . Uterine leiomyoma     Family History  Problem Relation Age of Onset  . Diabetes Mother   . Depression Mother   . Stroke Mother   . Diabetes Maternal Grandmother   . Heart disease Maternal Grandfather   . Tuberculosis Paternal Grandfather     Past Surgical History:  Procedure Laterality Date  . BUNIONECTOMY Bilateral 2007  . CESAREAN SECTION     x 2  . ROBOTIC ASSISTED TOTAL HYSTERECTOMY WITH BILATERAL SALPINGO OOPHERECTOMY N/A 03/03/2016   Procedure: XI ROBOTIC ASSISTED TOTAL HYSTERECTOMY WITH BILATERAL Salipingectomy ;  Surgeon: Everitt Amber, MD;  Location: [facility];  Service: Gynecology;  Laterality: N/A;  . TUBAL LIGATION     Social History   Occupational History  . Not on file  Tobacco Use  . Smoking status: Current Every Day Smoker    Packs/day: 0.25    Years: 15.00    Pack years: 3.75    Types: Cigarettes  . Smokeless tobacco: Never Used  Substance and Sexual Activity  . Alcohol use: No  . Drug use: No  . Sexual activity:  Not on file

## 2020-08-05 ENCOUNTER — Other Ambulatory Visit: Payer: BC Managed Care – PPO

## 2020-08-12 ENCOUNTER — Other Ambulatory Visit: Payer: Self-pay | Admitting: Physician Assistant

## 2020-08-12 MED ORDER — OXYCODONE-ACETAMINOPHEN 5-325 MG PO TABS
1.0000 | ORAL_TABLET | Freq: Four times a day (QID) | ORAL | 0 refills | Status: DC | PRN
Start: 2020-08-12 — End: 2022-06-26

## 2020-08-12 MED ORDER — ONDANSETRON HCL 4 MG PO TABS: 4.0000 mg | ORAL_TABLET | Freq: Three times a day (TID) | ORAL | 0 refills | Status: DC | PRN

## 2020-08-15 ENCOUNTER — Other Ambulatory Visit: Payer: Self-pay | Admitting: Orthopaedic Surgery

## 2020-08-15 ENCOUNTER — Encounter: Payer: Self-pay | Admitting: Orthopaedic Surgery

## 2020-08-15 DIAGNOSIS — M24111 Other articular cartilage disorders, right shoulder: Secondary | ICD-10-CM | POA: Insufficient documentation

## 2020-08-15 DIAGNOSIS — M7541 Impingement syndrome of right shoulder: Secondary | ICD-10-CM | POA: Diagnosis not present

## 2020-08-22 ENCOUNTER — Ambulatory Visit (INDEPENDENT_AMBULATORY_CARE_PROVIDER_SITE_OTHER): Payer: BC Managed Care – PPO | Admitting: Physician Assistant

## 2020-08-22 ENCOUNTER — Other Ambulatory Visit: Payer: Self-pay

## 2020-08-22 ENCOUNTER — Encounter: Payer: Self-pay | Admitting: Orthopaedic Surgery

## 2020-08-22 DIAGNOSIS — Z9889 Other specified postprocedural states: Secondary | ICD-10-CM

## 2020-08-22 MED ORDER — HYDROCODONE-ACETAMINOPHEN 5-325 MG PO TABS
1.0000 | ORAL_TABLET | Freq: Two times a day (BID) | ORAL | 0 refills | Status: DC | PRN
Start: 2020-08-22 — End: 2022-06-26

## 2020-08-22 NOTE — Progress Notes (Signed)
   Post-Op Visit Note   Patient: [name]           Date of Birth: 1966/06/07           MRN: [id number] Visit Date: 08/22/2020 PCP: Darreld Mclean, MD   Assessment & Plan:  Chief Complaint:  Chief Complaint  Patient presents with  . Right Shoulder - Routine Post Op   Visit Diagnoses:  1. S/P arthroscopy of right shoulder     Plan: Patient is a very pleasant 54 year old female who comes in today 1 week out right shoulder arthroscopic debridement decompression.  She has been doing well.  She has minimal pain.  She does take an occasional narcotic pain pill.  She has been wearing her sling for comfort.  Examination of her right shoulder reveals fully healed surgical portals with nylon sutures in place.  No evidence of infection.  She is neurovascular intact distally.  Today, sutures were removed and Steri-Strips applied.  She may discontinue her sling.  We will start her in outpatient physical therapy and a hard copy prescription has been provided to the patient.  She will follow up with [place] in 5 weeks time for repeat evaluation.  Follow-Up Instructions: Return in about 5 weeks (around 09/26/2020).   Orders:  No orders of the defined types were placed in this encounter.  Meds ordered this encounter  Medications  . HYDROcodone-acetaminophen (NORCO) 5-325 MG tablet    Sig: Take 1-2 tablets by mouth 2 (two) times daily as needed.    Dispense:  30 tablet    Refill:  0    Imaging: No new imaging   PMFS History: Patient Active Problem List   Diagnosis Date Noted  . 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 Rotator cuff impingement syndrome of right shoulder 07/31/2020  . Uterine fibroid 03/03/2016  . Fibroids, intramural 02/10/2016  . DVT (deep venous thrombosis) ([place]) 08/01/2014   Past Medical History:  Diagnosis Date  . Anemia   . Chicken pox   . Left leg DVT ([place]) 2014  . Uterine leiomyoma     Family History  Problem Relation Age of  Onset  . Diabetes Mother   . Depression Mother   . Stroke Mother   . Diabetes Maternal Grandmother   . Heart disease Maternal Grandfather   . Tuberculosis Paternal Grandfather     Past Surgical History:  Procedure Laterality Date  . BUNIONECTOMY Bilateral 2007  . CESAREAN SECTION     x 2  . ROBOTIC ASSISTED TOTAL HYSTERECTOMY WITH BILATERAL SALPINGO OOPHERECTOMY N/A 03/03/2016   Procedure: XI ROBOTIC ASSISTED TOTAL HYSTERECTOMY WITH BILATERAL Salipingectomy ;  Surgeon: Everitt Amber, MD;  Location: [facility];  Service: Gynecology;  Laterality: N/A;  . TUBAL LIGATION     Social History   Occupational History  . Not on file  Tobacco Use  . Smoking status: Current Every Day Smoker    Packs/day: 0.25    Years: 15.00    Pack years: 3.75    Types: Cigarettes  . Smokeless tobacco: Never Used  Substance and Sexual Activity  . Alcohol use: No  . Drug use: No  . Sexual activity: Not on file

## 2020-09-26 ENCOUNTER — Ambulatory Visit (INDEPENDENT_AMBULATORY_CARE_PROVIDER_SITE_OTHER): Payer: BC Managed Care – PPO | Admitting: Orthopaedic Surgery

## 2020-09-26 DIAGNOSIS — Z9889 Other specified postprocedural states: Secondary | ICD-10-CM

## 2020-09-26 NOTE — Progress Notes (Signed)
   Post-Op Visit Note   Patient: [name]           Date of Birth: 04-Apr-1966           MRN: [id number] Visit Date: 09/26/2020 PCP: Darreld Mclean, MD   Assessment & Plan:  Visit Diagnoses:  1. S/P arthroscopy of right shoulder     Plan:   [place] is 6 weeks status post right shoulder scope and debridement.  She has not gotten a physical therapy because she tried to get in with [facility] but they did not take her insurance.  She denies any night time pain.  She does not really have any complaints and she feels that overall she feels much better since she has had the surgery.  Examination of the right shoulder shows fully healed surgical scars.  Manual muscle testing shows excellent strength with some slight guarding due to mild pain.  Range of motion is supple.  No signs of infection.  Overall Cinnamon has recovered quite well despite the lack of formal physical therapy.  I will make a referral to our physical therapy office for her.  Recheck in 6 weeks.  Follow-Up Instructions: Return in about 6 weeks (around 11/07/2020).   Orders:  Orders Placed This Encounter  Procedures  . Ambulatory referral to Physical Therapy   No orders of the defined types were placed in this encounter.   Imaging: No results found.  PMFS History: Patient Active Problem List   Diagnosis Date Noted  . 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 Rotator cuff impingement syndrome of right shoulder 07/31/2020  . Uterine fibroid 03/03/2016  . Fibroids, intramural 02/10/2016  . DVT (deep venous thrombosis) ([place]) 08/01/2014   Past Medical History:  Diagnosis Date  . Anemia   . Chicken pox   . Left leg DVT ([place]) 2014  . Uterine leiomyoma     Family History  Problem Relation Age of Onset  . Diabetes Mother   . Depression Mother   . Stroke Mother   . Diabetes Maternal Grandmother   . Heart disease Maternal Grandfather   . Tuberculosis Paternal Grandfather     Past  Surgical History:  Procedure Laterality Date  . BUNIONECTOMY Bilateral 2007  . CESAREAN SECTION     x 2  . ROBOTIC ASSISTED TOTAL HYSTERECTOMY WITH BILATERAL SALPINGO OOPHERECTOMY N/A 03/03/2016   Procedure: XI ROBOTIC ASSISTED TOTAL HYSTERECTOMY WITH BILATERAL Salipingectomy ;  Surgeon: Everitt Amber, MD;  Location: [facility];  Service: Gynecology;  Laterality: N/A;  . TUBAL LIGATION     Social History   Occupational History  . Not on file  Tobacco Use  . Smoking status: Current Every Day Smoker    Packs/day: 0.25    Years: 15.00    Pack years: 3.75    Types: Cigarettes  . Smokeless tobacco: Never Used  Substance and Sexual Activity  . Alcohol use: No  . Drug use: No  . Sexual activity: Not on file

## 2020-10-03 ENCOUNTER — Other Ambulatory Visit: Payer: Self-pay

## 2020-10-03 ENCOUNTER — Encounter: Payer: Self-pay | Admitting: Physical Therapy

## 2020-10-03 ENCOUNTER — Ambulatory Visit (INDEPENDENT_AMBULATORY_CARE_PROVIDER_SITE_OTHER): Payer: BC Managed Care – PPO | Admitting: Physical Therapy

## 2020-10-03 DIAGNOSIS — R600 Localized edema: Secondary | ICD-10-CM | POA: Diagnosis not present

## 2020-10-03 DIAGNOSIS — M25511 Pain in right shoulder: Secondary | ICD-10-CM | POA: Diagnosis not present

## 2020-10-03 DIAGNOSIS — M6281 Muscle weakness (generalized): Secondary | ICD-10-CM | POA: Diagnosis not present

## 2020-10-03 DIAGNOSIS — M25611 Stiffness of right shoulder, not elsewhere classified: Secondary | ICD-10-CM

## 2020-10-03 NOTE — Patient Instructions (Signed)
Access Code: [email or web address] URL: [email or web address] Date: 10/03/2020 Prepared by: Elsie Ra  Exercises Standing Shoulder Internal Rotation Stretch with Towel - 2 x daily - 6 x weekly - 10 reps - 1 sets - 10 sec hold Standing Shoulder Abduction Slides at Wall - 2 x daily - 6 x weekly - 2-3 sets - 10 reps Shoulder Flexion Wall Slide with Towel - 2 x daily - 6 x weekly - 2-3 sets - 10 reps Shoulder Internal Rotation with Resistance - 2 x daily - 6 x weekly - 2 sets - 10 reps Shoulder External Rotation with Anchored Resistance - 2 x daily - 6 x weekly - 2 sets - 10 reps Standing Single Arm Shoulder Abduction with Resistance - 2 x daily - 6 x weekly - 2 sets - 10 reps Standing Shoulder Flexion with Resistance - 2 x daily - 6 x weekly - 2 sets - 10 reps

## 2020-10-03 NOTE — Therapy (Signed)
[facility] Physical Therapy [address] [place], [place], [address] Phone: [phone]   Fax:  [phone]  Physical Therapy Evaluation  Patient Details  Name: [name] MRN: [id number] Date of Birth: Sep 21, 1965 Referring Provider (PT): Frankey Shown, MD   Encounter Date: 10/03/2020   PT End of Session - 10/03/20 1013    Visit Number 1    Number of Visits 16    Date for PT Re-Evaluation 11/28/20    PT Start Time 0930    PT Stop Time 1012    PT Time Calculation (min) 42 min    Activity Tolerance Patient tolerated treatment well;No increased pain    Behavior During Therapy WFL for tasks assessed/performed           Past Medical History:  Diagnosis Date  . Anemia   . Chicken pox   . Left leg DVT ([place]) 2014  . Uterine leiomyoma     Past Surgical History:  Procedure Laterality Date  . BUNIONECTOMY Bilateral 2007  . CESAREAN SECTION     x 2  . ROBOTIC ASSISTED TOTAL HYSTERECTOMY WITH BILATERAL SALPINGO OOPHERECTOMY N/A 03/03/2016   Procedure: XI ROBOTIC ASSISTED TOTAL HYSTERECTOMY WITH BILATERAL Salipingectomy ;  Surgeon: Everitt Amber, MD;  Location: [facility];  Service: Gynecology;  Laterality: N/A;  . TUBAL LIGATION      There were no vitals filed for this visit.    Subjective Assessment - 10/03/20 0933    Subjective Patient reports she is doing pretty well since her Rt shoulder surgery 7 weeks ago. 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 She relays she had shampooed a client's hair and felt like she overdid it as well as trying to do her own hair she has found limitations. She denies any pain today but just notes weakness with carrying and lifting objects like a cooking pan. She is sleeping fine but occasionally has pain upon waking due to being a side sleeper, pain usually occurs when she wakes up from sleeping on Rt side. Anything she has troubles with her family has helped her  with.    Limitations Lifting;House hold activities    How long can you sit comfortably? no limit    How long can you stand comfortably? no limit    How long can you walk comfortably? 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 No/denies              [facility] PT Assessment - 10/03/20 0001      Assessment   Medical Diagnosis s/p R shoulder arthoscopy    Referring Provider (PT) Frankey Shown, MD    Onset Date/Surgical Date 08/15/20      Precautions   Precautions None      Restrictions   Weight Bearing Restrictions No      Balance Screen   Has the patient fallen in the past 6 months No    Has the patient had a decrease in activity level because of a fear of falling?  No    Is the patient reluctant to leave their home because of a fear of falling?  No      Home Ecologist residence      Prior Function   Level of Independence Independent    Vocation --  realtor full time, part time hair stylist     Cognition   Overall Cognitive Status Within Functional Limits for tasks assessed      Observation/Other Assessments   Observations scar is healing well, closed, minimal swelling    Focus on Therapeutic Outcomes (FOTO)  functional intake score 55%, predicted 71      ROM / Strength   AROM / PROM / Strength AROM;Strength      AROM   Overall AROM Comments scapular motion full upward rotation    AROM Assessment Site Shoulder    Right/Left Shoulder Right    Right Shoulder Extension --   WFL   Right Shoulder Flexion 111 Degrees    Right Shoulder ABduction 94 Degrees    Right Shoulder Internal Rotation 51 Degrees    Right Shoulder External Rotation --   [facility]     Strength   Strength Assessment Site Shoulder    Right/Left Shoulder Right    Right Shoulder Flexion 3+/5    Right Shoulder ABduction 3+/5    Right Shoulder Internal Rotation 3+/5     Right Shoulder External Rotation 4-/5      Palpation   Palpation comment no TTP                      Objective measurements completed on examination: See above findings.       [facility] Adult PT Treatment/Exercise - 10/03/20 0001      Manual Therapy   Manual Therapy Passive ROM;Joint mobilization    Joint Mobilization grade 3 inf mobs 3 min    Passive ROM 8 mins flex, abd, IR                  PT Education - 10/03/20 1127    Education Details HEP and POC    Person(s) Educated Patient    Methods Explanation;Handout;Demonstration    Comprehension Verbalized understanding;Returned demonstration            PT Short Term Goals - 10/03/20 1127      PT SHORT TERM GOAL #1   Title Patient will be I with HEP    Time 3    Period Weeks    Status New    Target Date 10/24/20      PT SHORT TERM GOAL #2   Title Patient will dhow improved ROM >120 for flex/abd for increased function of ADLs    Baseline 111 flex, 94 abd    Time 4    Period Weeks    Status New    Target Date 10/31/20             PT Long Term Goals - 10/03/20 1137      PT LONG TERM GOAL #1   Title Patient will demonstrate improved strength to 4+/5 for increased strength    Time 8    Period Weeks    Status New    Target Date 11/28/20      PT LONG TERM GOAL #2   Title Patient will be able to tolerate self grooming and grooming activity for 2 other clients without pain increasing over 2/10 on NPS    Baseline unable to do her own hair or 1 persons without overdoing it    Time 8    Period Weeks    Status New    Target Date 11/28/20      PT LONG TERM GOAL #3   Title Patient will demonstrate AROM within normal limits for flexion,  abduction, and internal rotation    Time 8    Period Weeks    Status New    Target Date 11/28/20      PT LONG TERM GOAL #4   Title Patient will show improed FOTO score    Baseline functional intake score 55, predicted 71%    Time 8    Period Weeks     Status New    Target Date 11/28/20                  Plan - 10/03/20 1259    Clinical Impression Statement Patient presents as a 55 y/o female s/p Rt shoulder arthroscopy 7 weeks ago. She is doing well with minimal pain and miimal swelling, her incision is closed and skin looks good. Patient is noticing her strength and ROM are limited as expected following surgery. Her Rt shoulder motion is limited by pain at her end range actively in flex, abd, and IR, however, She is able to move through full ROM with ER. Comprehensive strength testing revealed weakness in all planes. Patient is independent in her home activities but feels limited by her fatigue and motion and is eager to return to her work responsibilities. Patient notes specific difficulty with dressing and grooming activities for herself and others and lifting and reaching overhead particularly with objects in hand. Patient can benefit from skilled PT to increase shoulder range of motion and strengthen within her range to be able to complete functional activities.    Personal Factors and Comorbidities Profession    Examination-Activity Limitations Bathing;Reach Overhead;Lift;Dressing;Caring for Others;Carry    Examination-Participation Restrictions Occupation;Cleaning;Community Activity;Shop;Meal Prep    Stability/Clinical Decision Making Stable/Uncomplicated    Clinical Decision Making Low    Rehab Potential Excellent    PT Frequency 2x / week    PT Duration 8 weeks    PT Treatment/Interventions Vasopneumatic Device;Taping;Passive range of motion;Patient/family education;Manual techniques;Neuromuscular re-education;Therapeutic exercise;Therapeutic activities;Functional mobility training;Moist Heat;Electrical Stimulation;Cryotherapy;Biofeedback;ADLs/Self Care Home Management    PT Next Visit Plan joint mobs, pulleys, PROM, more AROM ativities, strenth progression    PT Home Exercise Plan Access code: [email or web address]    Consulted and Agree  with Plan of Care Patient           Patient will benefit from skilled therapeutic intervention in order to improve the following deficits and impairments:  Impaired UE functional use,Decreased endurance,Decreased activity tolerance,Pain,Decreased mobility,Decreased strength  Visit Diagnosis: Acute pain of right shoulder  Stiffness of right shoulder, not elsewhere classified  Localized edema  Muscle weakness (generalized)     Problem List Patient Active Problem List   Diagnosis Date Noted  . 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 Rotator cuff impingement syndrome of right shoulder 07/31/2020  . Uterine fibroid 03/03/2016  . Fibroids, intramural 02/10/2016  . DVT (deep venous thrombosis) ([place]) 08/01/2014    Glenetta Hew, SPT 10/03/2020, 1:18 PM   During this treatment session, this physical therapist was present, participating in and directing the treatment.   This note has been reviewed and this clinician agrees with the information provided.  Elsie Ra, PT, DPT 10/03/20 3:09 PM   [facility] Physical Therapy [address] [place], [place], [address] Phone: [phone]   Fax:  [phone]  Name: [name] MRN: [id number] Date of Birth: Jan 08, 1966

## 2020-10-09 ENCOUNTER — Ambulatory Visit (INDEPENDENT_AMBULATORY_CARE_PROVIDER_SITE_OTHER): Payer: BC Managed Care – PPO | Admitting: Rehabilitative and Restorative Service Providers"

## 2020-10-09 ENCOUNTER — Other Ambulatory Visit: Payer: Self-pay

## 2020-10-09 ENCOUNTER — Encounter: Payer: Self-pay | Admitting: Rehabilitative and Restorative Service Providers"

## 2020-10-09 DIAGNOSIS — R600 Localized edema: Secondary | ICD-10-CM

## 2020-10-09 DIAGNOSIS — M25611 Stiffness of right shoulder, not elsewhere classified: Secondary | ICD-10-CM

## 2020-10-09 DIAGNOSIS — M25511 Pain in right shoulder: Secondary | ICD-10-CM | POA: Diagnosis not present

## 2020-10-09 DIAGNOSIS — M6281 Muscle weakness (generalized): Secondary | ICD-10-CM | POA: Diagnosis not present

## 2020-10-09 NOTE — Therapy (Addendum)
[facility] Physical Therapy [address] [place], [place], [address] Phone: [phone]   Fax:  [phone]  Physical Therapy Treatment  Patient Details  Name: [name] MRN: [id number] Date of Birth: 09/08/1965 Referring Provider (PT): Frankey Shown, MD   Encounter Date: 10/09/2020   PT End of Session - 10/09/20 0821    Visit Number 2    Number of Visits 16    Date for PT Re-Evaluation 11/28/20    PT Start Time 0800    PT Stop Time 0839    PT Time Calculation (min) 39 min    Activity Tolerance Patient tolerated treatment well;No increased pain    Behavior During Therapy WFL for tasks assessed/performed           Past Medical History:  Diagnosis Date  . Anemia   . Chicken pox   . Left leg DVT ([place]) 2014  . Uterine leiomyoma     Past Surgical History:  Procedure Laterality Date  . BUNIONECTOMY Bilateral 2007  . CESAREAN SECTION     x 2  . ROBOTIC ASSISTED TOTAL HYSTERECTOMY WITH BILATERAL SALPINGO OOPHERECTOMY N/A 03/03/2016   Procedure: XI ROBOTIC ASSISTED TOTAL HYSTERECTOMY WITH BILATERAL Salipingectomy ;  Surgeon: Everitt Amber, MD;  Location: [facility];  Service: Gynecology;  Laterality: N/A;  . TUBAL LIGATION      There were no vitals filed for this visit.   Subjective Assessment - 10/09/20 0806    Subjective Pt. indicated feeling some trouble in morning but better with movement.  Hand behind back still most trouble.    Limitations Lifting;House hold activities    How long can you sit comfortably? no limit    How long can you stand comfortably? no limit    How long can you walk comfortably? 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 No/denies                             [facility] Adult PT Treatment/Exercise - 10/09/20 0001      Exercises   Exercises Shoulder      Shoulder Exercises: Supine   Horizontal  ABduction Strengthening   3  x10 green band     Shoulder Exercises: Pulleys   Flexion 2 minutes    ABduction 2 minutes      Shoulder Exercises: Stretch   Other Shoulder Stretches sleeper stretch in Rt sidelying 30 sec x 4      Manual Therapy   Joint Mobilization grade 2-3 inferior jt mobs, PROM for IR                    PT Short Term Goals - 10/03/20 1127      PT SHORT TERM GOAL #1   Title Patient will be I with HEP    Time 3    Period Weeks    Status New    Target Date 10/24/20      PT SHORT TERM GOAL #2   Title Patient will show improved ROM >120 for flex/abd for increased function of ADLs    Baseline 111 flex, 94 abd    Time 4    Period Weeks    Status New    Target Date 10/31/20             PT Long Term Goals - 10/03/20 1137  PT LONG TERM GOAL #1   Title Patient will demonstrate improved strength to 4+/5 for increased strength    Time 8    Period Weeks    Status New    Target Date 11/28/20      PT LONG TERM GOAL #2   Title Patient will be able to tolerate self grooming and grooming activity for 2 other clients without pain increasing over 2/10 on NPS    Baseline unable to do her own hair or 1 persons without overdoing it    Time 8    Period Weeks    Status New    Target Date 11/28/20      PT LONG TERM GOAL #3   Title Patient will demonstrate AROM within normal limits for flexion, abduction, and internal rotation    Time 8    Period Weeks    Status New    Target Date 11/28/20      PT LONG TERM GOAL #4   Title Patient will show improed FOTO score    Baseline functional intake score 55, predicted 71%    Time 8    Period Weeks    Status New    Target Date 11/28/20                 Plan - 10/09/20 0819    Clinical Impression Statement Combination of end range capsular tightness and myofascial restriction noted c primary restriction in IR mobility.  Good reported tolerance to initial HEP at this time.  Pt. may continue to  benefit from skilled PT services to continue mobility gains and strength gains for improved function.    Personal Factors and Comorbidities Profession    Examination-Activity Limitations Bathing;Reach Overhead;Lift;Dressing;Caring for Others;Carry    Examination-Participation Restrictions Occupation;Cleaning;Community Activity;Shop;Meal Prep    Stability/Clinical Decision Making Stable/Uncomplicated    Rehab Potential Excellent    PT Frequency 2x / week    PT Duration 8 weeks    PT Treatment/Interventions Vasopneumatic Device;Taping;Passive range of motion;Patient/family education;Manual techniques;Neuromuscular re-education;Therapeutic exercise;Therapeutic activities;Functional mobility training;Moist Heat;Electrical Stimulation;Cryotherapy;Biofeedback;ADLs/Self Care Home Management    PT Next Visit Plan Gravity reduced strength/AROM in table activity, avoid shrug sign c exercise    PT Home Exercise Plan Access code: [email or web address]    Consulted and Agree with Plan of Care Patient           Patient will benefit from skilled therapeutic intervention in order to improve the following deficits and impairments:  Impaired UE functional use,Decreased endurance,Decreased activity tolerance,Pain,Decreased mobility,Decreased strength  Visit Diagnosis: Acute pain of right shoulder  Stiffness of right shoulder, not elsewhere classified  Localized edema  Muscle weakness (generalized)     Problem List Patient Active Problem List   Diagnosis Date Noted  . 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 Rotator cuff impingement syndrome of right shoulder 07/31/2020  . Uterine fibroid 03/03/2016  . Fibroids, intramural 02/10/2016  . DVT (deep venous thrombosis) ([place]) 08/01/2014    Scot Jun, PT, DPT, OCS, ATC 10/09/20  8:40 AM    [facility] Physical Therapy [address] [place], [place], [address] Phone: [phone]   Fax:  [phone]  Name:  [name] MRN: [id number] Date of Birth: Apr 10, 1966

## 2020-10-15 ENCOUNTER — Ambulatory Visit (INDEPENDENT_AMBULATORY_CARE_PROVIDER_SITE_OTHER): Payer: BC Managed Care – PPO | Admitting: Rehabilitative and Restorative Service Providers"

## 2020-10-15 ENCOUNTER — Encounter: Payer: Self-pay | Admitting: Rehabilitative and Restorative Service Providers"

## 2020-10-15 ENCOUNTER — Other Ambulatory Visit: Payer: Self-pay

## 2020-10-15 DIAGNOSIS — M25511 Pain in right shoulder: Secondary | ICD-10-CM | POA: Diagnosis not present

## 2020-10-15 DIAGNOSIS — M25611 Stiffness of right shoulder, not elsewhere classified: Secondary | ICD-10-CM

## 2020-10-15 DIAGNOSIS — M6281 Muscle weakness (generalized): Secondary | ICD-10-CM

## 2020-10-15 DIAGNOSIS — R600 Localized edema: Secondary | ICD-10-CM | POA: Diagnosis not present

## 2020-10-15 NOTE — Therapy (Signed)
[facility] Physical Therapy [address] [place], [place], [address] Phone: [phone]   Fax:  [phone]  Physical Therapy Treatment  Patient Details  Name: [name] MRN: [id number] Date of Birth: 01-19-1966 Referring Provider (PT): Frankey Shown, MD   Encounter Date: 10/15/2020   PT End of Session - 10/15/20 1009    Visit Number 3    Number of Visits 16    Date for PT Re-Evaluation 11/28/20    PT Start Time 1010    PT Stop Time 1050    PT Time Calculation (min) 40 min    Activity Tolerance Patient tolerated treatment well;No increased pain    Behavior During Therapy WFL for tasks assessed/performed           Past Medical History:  Diagnosis Date  . Anemia   . Chicken pox   . Left leg DVT ([place]) 2014  . Uterine leiomyoma     Past Surgical History:  Procedure Laterality Date  . BUNIONECTOMY Bilateral 2007  . CESAREAN SECTION     x 2  . ROBOTIC ASSISTED TOTAL HYSTERECTOMY WITH BILATERAL SALPINGO OOPHERECTOMY N/A 03/03/2016   Procedure: XI ROBOTIC ASSISTED TOTAL HYSTERECTOMY WITH BILATERAL Salipingectomy ;  Surgeon: Everitt Amber, MD;  Location: [facility];  Service: Gynecology;  Laterality: N/A;  . TUBAL LIGATION      There were no vitals filed for this visit.   Subjective Assessment - 10/15/20 1015    Subjective Pt. indicated no real pain at rest, some noted c reaching back.  Stiff in morning.  Trouble with part time job.    Limitations Lifting;House hold activities    How long can you sit comfortably? no limit    How long can you stand comfortably? no limit    How long can you walk comfortably? 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 No/denies              [facility] PT Assessment - 10/15/20 0001      AROM   Overall AROM Comments supine measurement    Right Shoulder Flexion 130 Degrees    Right Shoulder ABduction 120  Degrees                         [facility] Adult PT Treatment/Exercise - 10/15/20 0001      Shoulder Exercises: Supine   Horizontal ABduction Strengthening   3 x 10   Theraband Level (Shoulder Horizontal ABduction) Level 3 (Green)      Shoulder Exercises: Sidelying   Flexion Right   3 x 10   Flexion Weight (lbs) 1    ABduction Right   1 lb 3 x 10   ABduction Weight (lbs) 1      Shoulder Exercises: Standing   External Rotation Strengthening;Left;Theraband;Right   3  x10   Theraband Level (Shoulder External Rotation) Level 3 (Green)    Internal Rotation Strengthening;Right;Theraband   3 x 10   Theraband Level (Shoulder Internal Rotation) Level 3 (Green)      Shoulder Exercises: ROM/Strengthening   UBE (Upper Arm Bike) Lvl 2.5 3 mins fwd/back each way with 10 sec interval harder end of each minute      Manual Therapy   Joint Mobilization grade 2-3 inferior jt mobs, MET for IR in 60 deg abd  PT Education - 10/15/20 1026    Education Details HEP update    Person(s) Educated Patient    Methods Explanation;Demonstration;Handout;Verbal cues    Comprehension Returned demonstration;Verbalized understanding            PT Short Term Goals - 10/15/20 1027      PT SHORT TERM GOAL #1   Title Patient will be I with HEP    Time 3    Period Weeks    Status On-going    Target Date 10/24/20      PT SHORT TERM GOAL #2   Title Patient will show improved ROM >120 for flex/abd for increased function of ADLs    Baseline 111 flex, 94 abd    Time 4    Period Weeks    Status Achieved    Target Date 10/31/20             PT Long Term Goals - 10/03/20 1137      PT LONG TERM GOAL #1   Title Patient will demonstrate improved strength to 4+/5 for increased strength    Time 8    Period Weeks    Status New    Target Date 11/28/20      PT LONG TERM GOAL #2   Title Patient will be able to tolerate self grooming and grooming activity for 2 other  clients without pain increasing over 2/10 on NPS    Baseline unable to do her own hair or 1 persons without overdoing it    Time 8    Period Weeks    Status New    Target Date 11/28/20      PT LONG TERM GOAL #3   Title Patient will demonstrate AROM within normal limits for flexion, abduction, and internal rotation    Time 8    Period Weeks    Status New    Target Date 11/28/20      PT LONG TERM GOAL #4   Title Patient will show improed FOTO score    Baseline functional intake score 55, predicted 71%    Time 8    Period Weeks    Status New    Target Date 11/28/20                 Plan - 10/15/20 1033    Clinical Impression Statement Pt. progressed well into sidelying AROM resistance activity today c mild fatigue in flexion based movement but overall good techniques.  IR still most limited passive movement at this time.  Elevation against gravity to approx. 100 deg prior to shrug and compensatory movements.    Personal Factors and Comorbidities Profession    Examination-Activity Limitations Bathing;Reach Overhead;Lift;Dressing;Caring for Others;Carry    Examination-Participation Restrictions Occupation;Cleaning;Community Activity;Shop;Meal Prep    Stability/Clinical Decision Making Stable/Uncomplicated    Rehab Potential Excellent    PT Frequency 2x / week    PT Duration 8 weeks    PT Treatment/Interventions Vasopneumatic Device;Taping;Passive range of motion;Patient/family education;Manual techniques;Neuromuscular re-education;Therapeutic exercise;Therapeutic activities;Functional mobility training;Moist Heat;Electrical Stimulation;Cryotherapy;Biofeedback;ADLs/Self Care Home Management    PT Next Visit Plan Continue progression elevation and scapular strengthening in table based activity and against gravity prior to shrug sign.    PT Home Exercise Plan Access code: [email or web address]    Consulted and Agree with Plan of Care Patient           Patient will benefit from skilled  therapeutic intervention in order to improve the following deficits and impairments:  Impaired UE functional use,Decreased endurance,Decreased  activity tolerance,Pain,Decreased mobility,Decreased strength  Visit Diagnosis: Acute pain of right shoulder  Stiffness of right shoulder, not elsewhere classified  Localized edema  Muscle weakness (generalized)     Problem List Patient Active Problem List   Diagnosis Date Noted  . 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 Rotator cuff impingement syndrome of right shoulder 07/31/2020  . Uterine fibroid 03/03/2016  . Fibroids, intramural 02/10/2016  . DVT (deep venous thrombosis) ([place]) 08/01/2014    Scot Jun, PT, DPT, OCS, ATC 10/15/20  10:43 AM    [facility] Physical Therapy [address] [place], [place], [address] Phone: [phone]   Fax:  [phone]  Name: [name] MRN: [id number] Date of Birth: [birth year]

## 2020-10-15 NOTE — Patient Instructions (Signed)
Access Code: [email or web address] URL: [email or web address] Date: 10/15/2020 Prepared by: Scot Jun  Exercises Standing Shoulder Internal Rotation Stretch with Towel - 2 x daily - 6 x weekly - 10 reps - 1 sets - 10 sec hold Shoulder Internal Rotation with Resistance - 2 x daily - 6 x weekly - 2 sets - 10 reps Shoulder External Rotation with Anchored Resistance - 2 x daily - 6 x weekly - 2 sets - 10 reps Sidelying Shoulder Flexion 15 Degrees - 1 x daily - 7 x weekly - 3 sets - 15 reps Sidelying Shoulder Abduction Palm Forward - 1 x daily - 7 x weekly - 3 sets - 15 reps Sleeper Stretch - 2 x daily - 7 x weekly - 1 sets - 5 reps - 30 hold Supine Shoulder Horizontal Abduction with Resistance - 2 x daily - 7 x weekly - 3 sets - 15 reps

## 2020-10-18 ENCOUNTER — Encounter: Payer: BC Managed Care – PPO | Admitting: Physical Therapy

## 2020-10-22 ENCOUNTER — Other Ambulatory Visit: Payer: Self-pay

## 2020-10-22 ENCOUNTER — Ambulatory Visit (INDEPENDENT_AMBULATORY_CARE_PROVIDER_SITE_OTHER): Payer: BC Managed Care – PPO | Admitting: Physical Therapy

## 2020-10-22 DIAGNOSIS — M25611 Stiffness of right shoulder, not elsewhere classified: Secondary | ICD-10-CM | POA: Diagnosis not present

## 2020-10-22 DIAGNOSIS — M6281 Muscle weakness (generalized): Secondary | ICD-10-CM

## 2020-10-22 DIAGNOSIS — R600 Localized edema: Secondary | ICD-10-CM

## 2020-10-22 DIAGNOSIS — M25511 Pain in right shoulder: Secondary | ICD-10-CM

## 2020-10-22 NOTE — Therapy (Signed)
[facility] Physical Therapy [address] [place], [place], [address] Phone: [phone]   Fax:  [phone]  Physical Therapy Treatment  Patient Details  Name: [name] MRN: [id number] Date of Birth: 01-10-1966 Referring Provider (PT): Frankey Shown, MD   Encounter Date: 10/22/2020   PT End of Session - 10/22/20 1224    Visit Number 4    Number of Visits 16    Date for PT Re-Evaluation 11/28/20    PT Start Time 3875    PT Stop Time 1225    PT Time Calculation (min) 42 min    Activity Tolerance Patient tolerated treatment well;No increased pain    Behavior During Therapy WFL for tasks assessed/performed           Past Medical History:  Diagnosis Date  . Anemia   . Chicken pox   . Left leg DVT ([place]) 2014  . Uterine leiomyoma     Past Surgical History:  Procedure Laterality Date  . BUNIONECTOMY Bilateral 2007  . CESAREAN SECTION     x 2  . ROBOTIC ASSISTED TOTAL HYSTERECTOMY WITH BILATERAL SALPINGO OOPHERECTOMY N/A 03/03/2016   Procedure: XI ROBOTIC ASSISTED TOTAL HYSTERECTOMY WITH BILATERAL Salipingectomy ;  Surgeon: Everitt Amber, MD;  Location: [facility];  Service: Gynecology;  Laterality: N/A;  . TUBAL LIGATION      There were no vitals filed for this visit.   Subjective Assessment - 10/22/20 1210    Subjective relays overall 7/10 pain but says this is getting better and her ROM is improving.    Limitations Lifting;House hold activities    How long can you sit comfortably? no limit    How long can you stand comfortably? no limit    How long can you walk comfortably? 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, complete household activities              [facility] PT Assessment - 10/22/20 0001      Assessment   Medical Diagnosis s/p R shoulder arthoscopy    Referring Provider (PT) Frankey Shown, MD    Onset Date/Surgical Date 08/15/20      AROM   Right Shoulder Flexion 150 Degrees     Right Shoulder ABduction 145 Degrees    Right Shoulder Internal Rotation --   L1 behind back   Right Shoulder External Rotation --   WNL          [facility] Adult PT Treatment/Exercise - 10/22/20 0001      Shoulder Exercises: Standing   Flexion Both;10 reps    Flexion Limitations 3 lb bar AAROM    Theraband Level (Shoulder Extension) Level 3 (Green)    Extension Weight (lbs) 2 sets of 15    Theraband Level (Shoulder Row) Level 3 (Green)    Row Limitations 2 sets of 15    Other Standing Exercises OH shoulder press 3# bar X 10 reps    Other Standing Exercises shoulder stability ball rolls 2# ball, 15 reps all planes      Shoulder Exercises: Pulleys   Flexion 2 minutes    ABduction 2 minutes      Shoulder Exercises: ROM/Strengthening   UBE (Upper Arm Bike) Lvl 2.5 3 mins fwd/back each way with 10 sec interval harder end of each minute    Ranger X 10 reps on Rt for cirlces, fleixon, abduction      Manual Therapy   Joint Mobilization grade 2-3  inferior jt mobs, A-P, P-A mobs, PROM all planes with more focus on IR and flexion                    PT Short Term Goals - 10/15/20 1027      PT SHORT TERM GOAL #1   Title Patient will be I with HEP    Time 3    Period Weeks    Status On-going    Target Date 10/24/20      PT SHORT TERM GOAL #2   Title Patient will show improved ROM >120 for flex/abd for increased function of ADLs    Baseline 111 flex, 94 abd    Time 4    Period Weeks    Status Achieved    Target Date 10/31/20             PT Long Term Goals - 10/03/20 1137      PT LONG TERM GOAL #1   Title Patient will demonstrate improved strength to 4+/5 for increased strength    Time 8    Period Weeks    Status New    Target Date 11/28/20      PT LONG TERM GOAL #2   Title Patient will be able to tolerate self grooming and grooming activity for 2 other clients without pain increasing over 2/10 on NPS    Baseline unable to do her own hair or 1 persons without  overdoing it    Time 8    Period Weeks    Status New    Target Date 11/28/20      PT LONG TERM GOAL #3   Title Patient will demonstrate AROM within normal limits for flexion, abduction, and internal rotation    Time 8    Period Weeks    Status New    Target Date 11/28/20      PT LONG TERM GOAL #4   Title Patient will show improed FOTO score    Baseline functional intake score 55, predicted 71%    Time 8    Period Weeks    Status New    Target Date 11/28/20                 Plan - 10/22/20 1225    Clinical Impression Statement She showed good improvements in overall AROM measurments today but still has mild limitations with this. She had good overall tolerance to strength progression and is making good overall progress. Continue POC.    Personal Factors and Comorbidities Profession    Examination-Activity Limitations Bathing;Reach Overhead;Lift;Dressing;Caring for Others;Carry    Examination-Participation Restrictions Occupation;Cleaning;Community Activity;Shop;Meal Prep    Stability/Clinical Decision Making Stable/Uncomplicated    Rehab Potential Excellent    PT Frequency 2x / week    PT Duration 8 weeks    PT Treatment/Interventions Vasopneumatic Device;Taping;Passive range of motion;Patient/family education;Manual techniques;Neuromuscular re-education;Therapeutic exercise;Therapeutic activities;Functional mobility training;Moist Heat;Electrical Stimulation;Cryotherapy;Biofeedback;ADLs/Self Care Home Management    PT Next Visit Plan Continue progression elevation and scapular strengthening in table based activity and against gravity prior to shrug sign.    PT Home Exercise Plan Access code: [email or web address]    Consulted and Agree with Plan of Care Patient           Patient will benefit from skilled therapeutic intervention in order to improve the following deficits and impairments:  Impaired UE functional use,Decreased endurance,Decreased activity tolerance,Pain,Decreased  mobility,Decreased strength  Visit Diagnosis: Acute pain of right shoulder  Stiffness of right shoulder, not elsewhere  classified  Localized edema  Muscle weakness (generalized)     Problem List Patient Active Problem List   Diagnosis Date Noted  . 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 Rotator cuff impingement syndrome of right shoulder 07/31/2020  . Uterine fibroid 03/03/2016  . Fibroids, intramural 02/10/2016  . DVT (deep venous thrombosis) ([place]) 08/01/2014    Debbe Odea, PT,DPT 10/22/2020, 12:26 PM    [facility] Physical Therapy [address] [place], [place], [address] Phone: [phone]   Fax:  [phone]  Name: [name] MRN: [id number] Date of Birth: December 22, 1965

## 2020-10-24 ENCOUNTER — Other Ambulatory Visit: Payer: Self-pay | Admitting: Family Medicine

## 2020-10-24 DIAGNOSIS — E559 Vitamin D deficiency, unspecified: Secondary | ICD-10-CM

## 2020-10-25 ENCOUNTER — Ambulatory Visit (INDEPENDENT_AMBULATORY_CARE_PROVIDER_SITE_OTHER): Payer: BC Managed Care – PPO | Admitting: Rehabilitative and Restorative Service Providers"

## 2020-10-25 ENCOUNTER — Other Ambulatory Visit: Payer: Self-pay

## 2020-10-25 DIAGNOSIS — M25611 Stiffness of right shoulder, not elsewhere classified: Secondary | ICD-10-CM | POA: Diagnosis not present

## 2020-10-25 DIAGNOSIS — M25511 Pain in right shoulder: Secondary | ICD-10-CM | POA: Diagnosis not present

## 2020-10-25 DIAGNOSIS — M6281 Muscle weakness (generalized): Secondary | ICD-10-CM | POA: Diagnosis not present

## 2020-10-25 DIAGNOSIS — R600 Localized edema: Secondary | ICD-10-CM | POA: Diagnosis not present

## 2020-10-25 NOTE — Therapy (Signed)
[facility] Physical Therapy [address] [place], [place], [address] Phone: [phone]   Fax:  [phone]  Physical Therapy Treatment  Patient Details  Name: [name] MRN: [id number] Date of Birth: 01-07-1966 Referring Provider (PT): Frankey Shown, MD   Encounter Date: 10/25/2020   PT End of Session - 10/25/20 1256    Visit Number 5    Number of Visits 16    Date for PT Re-Evaluation 11/28/20    Progress Note Due on Visit 10    PT Start Time 7408    PT Stop Time 1336    PT Time Calculation (min) 40 min    Activity Tolerance Patient tolerated treatment well;No increased pain    Behavior During Therapy WFL for tasks assessed/performed           Past Medical History:  Diagnosis Date  . Anemia   . Chicken pox   . Left leg DVT ([place]) 2014  . Uterine leiomyoma     Past Surgical History:  Procedure Laterality Date  . BUNIONECTOMY Bilateral 2007  . CESAREAN SECTION     x 2  . ROBOTIC ASSISTED TOTAL HYSTERECTOMY WITH BILATERAL SALPINGO OOPHERECTOMY N/A 03/03/2016   Procedure: XI ROBOTIC ASSISTED TOTAL HYSTERECTOMY WITH BILATERAL Salipingectomy ;  Surgeon: Everitt Amber, MD;  Location: [facility];  Service: Gynecology;  Laterality: N/A;  . TUBAL LIGATION      There were no vitals filed for this visit.   Subjective Assessment - 10/25/20 1300    Subjective Pt. indicated no real pain upon arrival today.  Pt. stated stiffness in morning is noted.  HEP helps during the day.    Limitations Lifting;House hold activities    How long can you sit comfortably? no limit    How long can you stand comfortably? no limit    How long can you walk comfortably? 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 No/denies                             [facility] Adult PT Treatment/Exercise - 10/25/20 0001      Shoulder Exercises: Sidelying   Flexion  Right    ABduction Right   2 lb 3 x 10   ABduction Weight (lbs) 2      Shoulder Exercises: Standing   External Rotation Strengthening;Theraband;Right   3 x 10   Theraband Level (Shoulder External Rotation) Level 3 (Green)    Internal Rotation Strengthening;Right;Theraband   3 x 10   Theraband Level (Shoulder Internal Rotation) Level 3 (Green)    Other Standing Exercises UE ranger 2 x 10 flexion c ipsilateral LE step in    Other Standing Exercises 100 deg flexion ball circles 2 lb 30 x cw, ccw each      Shoulder Exercises: ROM/Strengthening   UBE (Upper Arm Bike) Lvl 4 3 mins fwd/back each way      Shoulder Exercises: Stretch   Other Shoulder Stretches IR rope stretch 30 sec x 5 c focus on avoiding forward trunk flexion      Manual Therapy   Manual therapy comments compression to Rt infraspinatus c ER movement    Joint Mobilization ap mobs g3, contract/relax techniques for IR/ER in supine                    PT Short Term  Goals - 10/15/20 1027      PT SHORT TERM GOAL #1   Title Patient will be I with HEP    Time 3    Period Weeks    Status On-going    Target Date 10/24/20      PT SHORT TERM GOAL #2   Title Patient will show improved ROM >120 for flex/abd for increased function of ADLs    Baseline 111 flex, 94 abd    Time 4    Period Weeks    Status Achieved    Target Date 10/31/20             PT Long Term Goals - 10/03/20 1137      PT LONG TERM GOAL #1   Title Patient will demonstrate improved strength to 4+/5 for increased strength    Time 8    Period Weeks    Status New    Target Date 11/28/20      PT LONG TERM GOAL #2   Title Patient will be able to tolerate self grooming and grooming activity for 2 other clients without pain increasing over 2/10 on NPS    Baseline unable to do her own hair or 1 persons without overdoing it    Time 8    Period Weeks    Status New    Target Date 11/28/20      PT LONG TERM GOAL #3   Title Patient will  demonstrate AROM within normal limits for flexion, abduction, and internal rotation    Time 8    Period Weeks    Status New    Target Date 11/28/20      PT LONG TERM GOAL #4   Title Patient will show improed FOTO score    Baseline functional intake score 55, predicted 71%    Time 8    Period Weeks    Status New    Target Date 11/28/20                 Plan - 10/25/20 1318    Clinical Impression Statement Ir most limiation in clinic today but progressing.  Pt. demonstrated marked improvement in tolerance and control in sidelying strengthening at ths time.  Standing forward elevation    Personal Factors and Comorbidities Profession    Examination-Activity Limitations Bathing;Reach Overhead;Lift;Dressing;Caring for Others;Carry    Examination-Participation Restrictions Occupation;Cleaning;Community Activity;Shop;Meal Prep    Stability/Clinical Decision Making Stable/Uncomplicated    Rehab Potential Excellent    PT Frequency 2x / week    PT Duration 8 weeks    PT Treatment/Interventions Vasopneumatic Device;Taping;Passive range of motion;Patient/family education;Manual techniques;Neuromuscular re-education;Therapeutic exercise;Therapeutic activities;Functional mobility training;Moist Heat;Electrical Stimulation;Cryotherapy;Biofeedback;ADLs/Self Care Home Management    PT Next Visit Plan Continue progression elevation and scapular strengthening in table based activity and against gravity prior to shrug sign.    PT Home Exercise Plan Access code: [email or web address]    Consulted and Agree with Plan of Care Patient           Patient will benefit from skilled therapeutic intervention in order to improve the following deficits and impairments:  Impaired UE functional use,Decreased endurance,Decreased activity tolerance,Pain,Decreased mobility,Decreased strength  Visit Diagnosis: Acute pain of right shoulder  Stiffness of right shoulder, not elsewhere classified  Localized edema  Muscle  weakness (generalized)     Problem List Patient Active Problem List   Diagnosis Date Noted  . 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 Rotator cuff impingement syndrome of right shoulder  07/31/2020  . Uterine fibroid 03/03/2016  . Fibroids, intramural 02/10/2016  . DVT (deep venous thrombosis) ([place]) 08/01/2014   Scot Jun, PT, DPT, OCS, ATC 10/25/20  1:27 PM    [facility] [address] [place], [place], [address] Phone: [phone]   Fax:  [phone]  Name: [name] MRN: [id number] Date of Birth: 1965/11/26

## 2020-10-29 ENCOUNTER — Ambulatory Visit (INDEPENDENT_AMBULATORY_CARE_PROVIDER_SITE_OTHER): Payer: BC Managed Care – PPO | Admitting: Physical Therapy

## 2020-10-29 ENCOUNTER — Other Ambulatory Visit: Payer: Self-pay

## 2020-10-29 DIAGNOSIS — M25611 Stiffness of right shoulder, not elsewhere classified: Secondary | ICD-10-CM

## 2020-10-29 DIAGNOSIS — M25511 Pain in right shoulder: Secondary | ICD-10-CM

## 2020-10-29 DIAGNOSIS — M6281 Muscle weakness (generalized): Secondary | ICD-10-CM

## 2020-10-29 DIAGNOSIS — R600 Localized edema: Secondary | ICD-10-CM

## 2020-10-29 NOTE — Patient Instructions (Signed)
Access Code: [email or web address] URL: [email or web address] Date: 10/29/2020 Prepared by: Elsie Ra  Exercises Seated Shoulder Flexion AAROM with Pulley Behind - 2 x daily - 6 x weekly - 30-40 reps Seated Shoulder Abduction AAROM with Pulley Behind - 2 x daily - 6 x weekly - 3 sets - 30-40 reps Sleeper Stretch - 2 x daily - 7 x weekly - 1 sets - 5 reps - 30 hold Standing Shoulder Internal Rotation Stretch with Towel - 2 x daily - 6 x weekly - 10 reps - 1 sets - 10 sec hold Shoulder External Rotation with Anchored Resistance - 2 x daily - 6 x weekly - 2 sets - 10 reps Standing Shoulder Flexion to 90 Degrees with Dumbbells - 2 x daily - 6 x weekly - 10 reps - 2 sets Shoulder Abduction with Dumbbells - Thumbs Up - 2 x daily - 6 x weekly - 2 sets - 10 reps

## 2020-10-29 NOTE — Therapy (Signed)
[facility] Physical Therapy [address] [place], [place], [address] Phone: [phone]   Fax:  [phone]  Physical Therapy Treatment  Patient Details  Name: [name] MRN: [id number] Date of Birth: 07/03/1966 Referring Provider ([name]): Frankey Shown, MD   Encounter Date: 10/29/2020   [name] End of Session - 10/29/20 1226    Visit Number 6    Number of Visits 16    Date for [name] Re-Evaluation 11/28/20    Progress Note Due on Visit 10    [name] Start Time 6433    [name] Stop Time 1223    [name] Time Calculation (min) 38 min    Activity Tolerance Patient tolerated treatment well;No increased pain    Behavior During Therapy WFL for tasks assessed/performed           Past Medical History:  Diagnosis Date  . Anemia   . Chicken pox   . Left leg DVT ([place]) 2014  . Uterine leiomyoma     Past Surgical History:  Procedure Laterality Date  . BUNIONECTOMY Bilateral 2007  . CESAREAN SECTION     x 2  . ROBOTIC ASSISTED TOTAL HYSTERECTOMY WITH BILATERAL SALPINGO OOPHERECTOMY N/A 03/03/2016   Procedure: XI ROBOTIC ASSISTED TOTAL HYSTERECTOMY WITH BILATERAL Salipingectomy ;  Surgeon: Everitt Amber, MD;  Location: [facility];  Service: Gynecology;  Laterality: N/A;  . TUBAL LIGATION      There were no vitals filed for this visit.   Subjective Assessment - 10/29/20 1154    Subjective She states much less pain overall, no pain upon arrival but still complains of stiffness and has limitations reaching behind back    Limitations Lifting;House hold activities    How long can you sit comfortably? no limit    How long can you stand comfortably? no limit    How long can you walk comfortably? 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, complete household activities              [facility] [name] Assessment - 10/29/20 0001      Assessment   Medical Diagnosis s/p R shoulder arthoscopy    Referring Provider ([name]) Frankey Shown, MD     Onset Date/Surgical Date 08/15/20      AROM   Right Shoulder Flexion 160 Degrees    Right Shoulder ABduction 160 Degrees    Right Shoulder Internal Rotation 53 Degrees    Right Shoulder External Rotation --   WNL     Strength   Right Shoulder Flexion 4/5    Right Shoulder ABduction 4-/5    Right Shoulder Internal Rotation 5/5    Right Shoulder External Rotation 4+/5            [facility] Adult [name] Treatment/Exercise - 10/29/20 0001      Shoulder Exercises: Standing   Flexion Both;20 reps    Shoulder Flexion Weight (lbs) 2    ABduction Both;20 reps    Shoulder ABduction Weight (lbs) 2    Other Standing Exercises 2 lb bar for overhead press X 20 reps, behind the back extensions 20 reps, behind back IR X 20 reps      Shoulder Exercises: Pulleys   Flexion 2 minutes    ABduction 2 minutes    Other Pulley Exercises IR 2 minutes      Shoulder Exercises: Stretch   Cross Chest Stretch 3 reps;20 seconds      Manual Therapy   Manual therapy comments  PROM for flexion, abd, IR, GH mobs grade 3 for inferiro and A-P mobs                    [name] Short Term Goals - 10/15/20 1027      [name] SHORT TERM GOAL #1   Title Patient will be I with HEP    Time 3    Period Weeks    Status On-going    Target Date 10/24/20      [name] SHORT TERM GOAL #2   Title Patient will show improved ROM >120 for flex/abd for increased function of ADLs    Baseline 111 flex, 94 abd    Time 4    Period Weeks    Status Achieved    Target Date 10/31/20             [name] Long Term Goals - 10/29/20 1228      [name] LONG TERM GOAL #1   Title Patient will demonstrate improved strength to 4+/5 for increased strength    Baseline now 4 to 4+    Time 8    Period Weeks    Status On-going      [name] LONG TERM GOAL #2   Title Patient will be able to tolerate self grooming and grooming activity for 2 other clients without pain increasing over 2/10 on NPS    Baseline unable to do her own hair or 1 persons without  overdoing it    Time 8    Period Weeks    Status On-going      [name] LONG TERM GOAL #3   Title Patient will demonstrate AROM within normal limits for flexion, abduction, and internal rotation    Baseline still limited with IR    Time 8    Period Weeks    Status On-going      [name] LONG TERM GOAL #4   Title Patient will show improed FOTO score    Baseline functional intake score 55, predicted 71%    Time 8    Period Weeks    Status On-going                 Plan - 10/29/20 1227    Clinical Impression Statement updated measurements reflect good overall progress in R shoulder ROM and strenght. She does still have deficits in these areas and will continue to benefit from [name]. Her HEP was updated today to reflect her progress for more challenges to improve ROM and strenght.    Personal Factors and Comorbidities Profession    Examination-Activity Limitations Bathing;Reach Overhead;Lift;Dressing;Caring for Others;Carry    Examination-Participation Restrictions Occupation;Cleaning;Community Activity;Shop;Meal Prep    Stability/Clinical Decision Making Stable/Uncomplicated    Rehab Potential Excellent    [name] Frequency 2x / week    [name] Duration 8 weeks    [name] Treatment/Interventions Vasopneumatic Device;Taping;Passive range of motion;Patient/family education;Manual techniques;Neuromuscular re-education;Therapeutic exercise;Therapeutic activities;Functional mobility training;Moist Heat;Electrical Stimulation;Cryotherapy;Biofeedback;ADLs/Self Care Home Management    [name] Next Visit Plan Continue strength progression and IR mobility    [name] Home Exercise Plan Access code: [email or web address]    Consulted and Agree with Plan of Care Patient           Patient will benefit from skilled therapeutic intervention in order to improve the following deficits and impairments:  Impaired UE functional use,Decreased endurance,Decreased activity tolerance,Pain,Decreased mobility,Decreased strength  Visit  Diagnosis: Acute pain of right shoulder  Stiffness of right shoulder, not elsewhere classified  Localized edema  Muscle weakness (generalized)  Problem List Patient Active Problem List   Diagnosis Date Noted  . 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 Rotator cuff impingement syndrome of right shoulder 07/31/2020  . Uterine fibroid 03/03/2016  . Fibroids, intramural 02/10/2016  . DVT (deep venous thrombosis) ([place]) 08/01/2014    [name], [name],[name] 10/29/2020, 12:29 PM  [facility] [address] [place], [place], [address] Phone: [phone]   Fax:  [phone]  Name: [name] MRN: [id number] Date of Birth: 13-Feb-1966

## 2020-10-31 ENCOUNTER — Encounter: Payer: BC Managed Care – PPO | Admitting: Physical Therapy

## 2020-11-04 ENCOUNTER — Encounter: Payer: BC Managed Care – PPO | Admitting: Rehabilitative and Restorative Service Providers"

## 2020-11-04 ENCOUNTER — Telehealth: Payer: Self-pay | Admitting: Rehabilitative and Restorative Service Providers"

## 2020-11-04 NOTE — Telephone Encounter (Signed)
Called and left voice message about no show for appointment.  Has MD visit March 3rd and no more additional physical therapy visits scheduled at this time.   Scot Jun, PT, DPT, OCS, ATC 11/04/20  3:36 PM

## 2020-11-07 ENCOUNTER — Ambulatory Visit: Payer: BC Managed Care – PPO | Admitting: Orthopaedic Surgery

## 2020-11-20 ENCOUNTER — Encounter: Payer: Self-pay | Admitting: Family Medicine

## 2020-11-29 ENCOUNTER — Ambulatory Visit (INDEPENDENT_AMBULATORY_CARE_PROVIDER_SITE_OTHER): Payer: BC Managed Care – PPO | Admitting: Orthopaedic Surgery

## 2020-11-29 ENCOUNTER — Encounter: Payer: Self-pay | Admitting: Orthopaedic Surgery

## 2020-11-29 DIAGNOSIS — Z9889 Other specified postprocedural states: Secondary | ICD-10-CM

## 2020-11-29 MED ORDER — IBUPROFEN 800 MG PO TABS: 800.0000 mg | ORAL_TABLET | Freq: Three times a day (TID) | ORAL | 0 refills | Status: DC | PRN

## 2020-11-29 NOTE — Progress Notes (Signed)
   Post-Op Visit Note   Patient: [name]           Date of Birth: [birth year]           MRN: [id number] Visit Date: 11/29/2020 PCP: Darreld Mclean, MD   Assessment & Plan:  Chief Complaint:  Chief Complaint  Patient presents with  . Right Shoulder - Routine Post Op, Follow-up   Visit Diagnoses:  1. S/P arthroscopy of right shoulder     Plan: Patient is a pleasant 55 year old female who comes in today little over 3 months out right shoulder arthroscopic debridement decompression.  She has been doing well.  She has missed her 2 most recent physical therapy sessions due to being out of the state as her mother-in-law suddenly passed away.  Overall, she is doing much better.  She is taking ibuprofen when needed for pain.  Examination of her right shoulder reveals near full forward flexion.  She can rotate to L5.  Near full strength throughout.  She is neurovascular intact distally.  At this point, she will continue with physical therapy for a little while longer to try and regain full range of motion.  She will continue to advance with activity as tolerated.  I refilled her ibuprofen.  Follow-up with [place] as needed.  Follow-Up Instructions: Return if symptoms worsen or fail to improve.   Orders:  No orders of the defined types were placed in this encounter.  Meds ordered this encounter  Medications  . ibuprofen (ADVIL) 800 MG tablet    Sig: Take 1 tablet (800 mg total) by mouth every 8 (eight) hours as needed.    Dispense:  30 tablet    Refill:  0    Imaging: No new imaging  PMFS History: Patient Active Problem List   Diagnosis Date Noted  . 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 Rotator cuff impingement syndrome of right shoulder 07/31/2020  . Uterine fibroid 03/03/2016  . Fibroids, intramural 02/10/2016  . DVT (deep venous thrombosis) ([place]) 08/01/2014   Past Medical History:  Diagnosis Date  . Anemia   . Chicken pox   . Left leg DVT  ([place]) 2014  . Uterine leiomyoma     Family History  Problem Relation Age of Onset  . Diabetes Mother   . Depression Mother   . Stroke Mother   . Diabetes Maternal Grandmother   . Heart disease Maternal Grandfather   . Tuberculosis Paternal Grandfather     Past Surgical History:  Procedure Laterality Date  . BUNIONECTOMY Bilateral 2007  . CESAREAN SECTION     x 2  . ROBOTIC ASSISTED TOTAL HYSTERECTOMY WITH BILATERAL SALPINGO OOPHERECTOMY N/A 03/03/2016   Procedure: XI ROBOTIC ASSISTED TOTAL HYSTERECTOMY WITH BILATERAL Salipingectomy ;  Surgeon: Everitt Amber, MD;  Location: [facility];  Service: Gynecology;  Laterality: N/A;  . TUBAL LIGATION     Social History   Occupational History  . Not on file  Tobacco Use  . Smoking status: Current Every Day Smoker    Packs/day: 0.25    Years: 15.00    Pack years: 3.75    Types: Cigarettes  . Smokeless tobacco: Never Used  Substance and Sexual Activity  . Alcohol use: No  . Drug use: No  . Sexual activity: Not on file

## 2020-12-09 ENCOUNTER — Encounter: Payer: Self-pay | Admitting: Rehabilitative and Restorative Service Providers"

## 2020-12-09 ENCOUNTER — Ambulatory Visit (INDEPENDENT_AMBULATORY_CARE_PROVIDER_SITE_OTHER): Payer: BC Managed Care – PPO | Admitting: Rehabilitative and Restorative Service Providers"

## 2020-12-09 ENCOUNTER — Other Ambulatory Visit: Payer: Self-pay

## 2020-12-09 DIAGNOSIS — M25611 Stiffness of right shoulder, not elsewhere classified: Secondary | ICD-10-CM | POA: Diagnosis not present

## 2020-12-09 DIAGNOSIS — R600 Localized edema: Secondary | ICD-10-CM

## 2020-12-09 DIAGNOSIS — M6281 Muscle weakness (generalized): Secondary | ICD-10-CM

## 2020-12-09 NOTE — Therapy (Signed)
[facility] [address] [place], [place], [address] Phone: [phone]   Fax:  [phone]  Physical Therapy Treatment  Patient Details  Name: [name] MRN: [id number] Date of Birth: 1966-04-24 Referring Provider (PT): Frankey Shown, MD   Encounter Date: 12/09/2020   PT End of Session - 12/09/20 1907    Visit Number 7    Number of Visits 16    Date for PT Re-Evaluation 11/28/20    Progress Note Due on Visit 10    PT Start Time 7062    PT Stop Time 1230    PT Time Calculation (min) 45 min    Activity Tolerance Patient tolerated treatment well;No increased pain    Behavior During Therapy WFL for tasks assessed/performed           Past Medical History:  Diagnosis Date  . Anemia   . Chicken pox   . Left leg DVT ([place]) 2014  . Uterine leiomyoma     Past Surgical History:  Procedure Laterality Date  . BUNIONECTOMY Bilateral 2007  . CESAREAN SECTION     x 2  . ROBOTIC ASSISTED TOTAL HYSTERECTOMY WITH BILATERAL SALPINGO OOPHERECTOMY N/A 03/03/2016   Procedure: XI ROBOTIC ASSISTED TOTAL HYSTERECTOMY WITH BILATERAL Salipingectomy ;  Surgeon: Everitt Amber, MD;  Location: [facility];  Service: Gynecology;  Laterality: N/A;  . TUBAL LIGATION      There were no vitals filed for this visit.   Subjective Assessment - 12/09/20 1902    Subjective [name] has been independent with her physical therapy over the pst 6 weeks due to a family emergency.  She wants an update on her progress and to continue her physical therapy.    Limitations Lifting;House hold activities    How long can you sit comfortably? no limit    How long can you stand comfortably? no limit    How long can you walk comfortably? 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 No/denies    Multiple Pain Sites No              [facility] PT Assessment - 12/09/20 0001       AROM   AROM Assessment Site Shoulder    Right/Left Shoulder Left;Right    Right Shoulder Flexion 145 Degrees    Right Shoulder Internal Rotation 30 Degrees    Right Shoulder External Rotation 95 Degrees    Right Shoulder Horizontal  ADduction 30 Degrees    Left Shoulder Flexion 175 Degrees    Left Shoulder Internal Rotation 70 Degrees    Left Shoulder External Rotation 110 Degrees    Left Shoulder Horizontal ADduction 40 Degrees      Strength   Overall Strength Deficits    Strength Assessment Site Shoulder    Right/Left Shoulder Left;Right    Right Shoulder Internal Rotation --   21 pounds   Right Shoulder External Rotation --   13.5 pounds   Left Shoulder Internal Rotation --   17 pounds   Left Shoulder External Rotation --   13 pounds                        [facility] Adult PT Treatment/Exercise - 12/09/20 0001      Exercises   Exercises Shoulder      Shoulder Exercises: Supine   Internal Rotation AAROM;Right;20 reps;Other (comment)   10  seconds     Shoulder Exercises: Standing   External Rotation Strengthening;Right;10 reps;Theraband;Other (comment)   3 seconds hold and slow eccentrics, 2 sets   Theraband Level (Shoulder External Rotation) Level 3 (Green)    Other Standing Exercises Thumb up the back stretch 10X 10 seconds      Shoulder Exercises: Pulleys   Flexion 2 minutes    Scaption 2 minutes                  PT Education - 12/09/20 1903    Education Details HEP update based on reassessment today.    Person(s) Educated Patient    Methods Explanation;Demonstration;Tactile cues;Verbal cues;Handout    Comprehension Verbalized understanding;Tactile cues required;Need further instruction;Returned demonstration;Verbal cues required            PT Short Term Goals - 12/09/20 1903      PT SHORT TERM GOAL #1   Title Patient will be I with HEP    Time 3    Period Weeks    Status On-going    Target Date 10/24/20      PT SHORT TERM GOAL #2    Title Patient will show improved ROM >120 for flex/abd for increased function of ADLs    Baseline 111 flex, 94 abd    Time 4    Period Weeks    Status Achieved    Target Date 10/31/20             PT Long Term Goals - 12/09/20 1903      PT LONG TERM GOAL #1   Title Improve R shoulder ER strength to 16 pounds.    Baseline 13.5 pounds    Time 8    Period Weeks    Status Revised    Target Date 01/06/21      PT LONG TERM GOAL #2   Title Patient will be able to tolerate self grooming and grooming activity for 2 other clients without pain increasing over 2/10 on NPS    Baseline unable to do her own hair or 1 persons without overdoing it    Time 8    Period Weeks    Status On-going      PT LONG TERM GOAL #3   Title Patient will demonstrate AROM within normal limits for flexion, abduction, and internal rotation    Baseline still limited with IR    Time 8    Period Weeks    Status On-going      PT LONG TERM GOAL #4   Title Patient will show improed FOTO score    Baseline functional intake score 55, predicted 71%    Time 8    Period Weeks    Status On-going    Target Date 01/06/21                 Plan - 12/09/20 1908    Clinical Impression Statement Ravinder has been independent with her exercises for 6 weeks due to a family emergency.  She wanted a status update and to continue to address "tightness" in her R shoulder.  Strength is overall good with some ER strength deficits.  Capsular tightness is noted particularly inferior and posterior.  Modifications/re-focus of her HEP today with return to supervised PT on a 1X/day basis.    Personal Factors and Comorbidities Profession    Examination-Activity Limitations Bathing;Reach Overhead;Lift;Dressing;Caring for Others;Carry    Examination-Participation Restrictions Occupation;Cleaning;Community Activity;Shop;Meal Prep    Stability/Clinical Decision Making Stable/Uncomplicated    Rehab  Potential Excellent    PT Frequency  2x / week    PT Duration 8 weeks    PT Treatment/Interventions Vasopneumatic Device;Taping;Passive range of motion;Patient/family education;Manual techniques;Neuromuscular re-education;Therapeutic exercise;Therapeutic activities;Functional mobility training;Moist Heat;Electrical Stimulation;Cryotherapy;Biofeedback;ADLs/Self Care Home Management    PT Next Visit Plan Continue strength progression and IR mobility    PT Home Exercise Plan Access code: [email or web address]    Consulted and Agree with Plan of Care Patient           Patient will benefit from skilled therapeutic intervention in order to improve the following deficits and impairments:  Impaired UE functional use,Decreased endurance,Decreased activity tolerance,Pain,Decreased mobility,Decreased strength  Visit Diagnosis: Stiffness of right shoulder, not elsewhere classified  Localized edema  Muscle weakness (generalized)     Problem List Patient Active Problem List   Diagnosis Date Noted  . 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 Rotator cuff impingement syndrome of right shoulder 07/31/2020  . Uterine fibroid 03/03/2016  . Fibroids, intramural 02/10/2016  . DVT (deep venous thrombosis) ([place]) 08/01/2014    Farley Ly PT, MPT 12/09/2020, 7:14 PM  [facility] Physical Therapy [address] [place], [place], [address] Phone: [phone]   Fax:  [phone]  Name: Larraine Argo MRN: [id number] Date of Birth: 02-08-1966

## 2020-12-10 NOTE — Addendum Note (Signed)
Addended by: Maricela Bo on: 12/10/2020 02:01 PM   Modules accepted: Orders

## 2020-12-27 ENCOUNTER — Encounter: Payer: Self-pay | Admitting: Rehabilitative and Restorative Service Providers"

## 2020-12-27 ENCOUNTER — Other Ambulatory Visit: Payer: Self-pay

## 2020-12-27 ENCOUNTER — Ambulatory Visit (INDEPENDENT_AMBULATORY_CARE_PROVIDER_SITE_OTHER): Payer: BC Managed Care – PPO | Admitting: Rehabilitative and Restorative Service Providers"

## 2020-12-27 DIAGNOSIS — R600 Localized edema: Secondary | ICD-10-CM | POA: Diagnosis not present

## 2020-12-27 DIAGNOSIS — M6281 Muscle weakness (generalized): Secondary | ICD-10-CM

## 2020-12-27 DIAGNOSIS — M25611 Stiffness of right shoulder, not elsewhere classified: Secondary | ICD-10-CM | POA: Diagnosis not present

## 2020-12-27 DIAGNOSIS — M25511 Pain in right shoulder: Secondary | ICD-10-CM | POA: Diagnosis not present

## 2020-12-27 NOTE — Therapy (Addendum)
[facility] Physical Therapy [address] [place], [place], [address] Phone: [phone]   Fax:  [phone]  Physical Therapy Treatment/Discharge  Patient Details  Name: [name] MRN: [id number] Date of Birth: 1966-07-12 Referring Provider (PT): Frankey Shown, MD   Encounter Date: 12/27/2020   PT End of Session - 12/27/20 1055    Visit Number 8    Number of Visits 16    Date for PT Re-Evaluation 11/28/20    Progress Note Due on Visit 10    PT Start Time 7619    PT Stop Time 5093    PT Time Calculation (min) 43 min    Activity Tolerance Patient tolerated treatment well;No increased pain    Behavior During Therapy WFL for tasks assessed/performed           Past Medical History:  Diagnosis Date  . Anemia   . Chicken pox   . Left leg DVT ([place]) 2014  . Uterine leiomyoma     Past Surgical History:  Procedure Laterality Date  . BUNIONECTOMY Bilateral 2007  . CESAREAN SECTION     x 2  . ROBOTIC ASSISTED TOTAL HYSTERECTOMY WITH BILATERAL SALPINGO OOPHERECTOMY N/A 03/03/2016   Procedure: XI ROBOTIC ASSISTED TOTAL HYSTERECTOMY WITH BILATERAL Salipingectomy ;  Surgeon: Everitt Amber, MD;  Location: [facility];  Service: Gynecology;  Laterality: N/A;  . TUBAL LIGATION      There were no vitals filed for this visit.   Subjective Assessment - 12/27/20 1032    Subjective [name] reports consistency (with some some lapses) with her HEP.  She reports improved AROM, although behind the back (IR) is still tight.    Limitations Lifting;House hold activities    How long can you sit comfortably? no limit    How long can you stand comfortably? no limit    How long can you walk comfortably? 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 Yes    Pain Score 2     Pain Location Shoulder    Pain Orientation Right    Pain Descriptors / Indicators Tightness;Sore     Pain Type Chronic pain    Pain Radiating Towards NA    Pain Onset More than a month ago    Pain Frequency Intermittent    Aggravating Factors  Reaching behind her back    Pain Relieving Factors Exercises    Effect of Pain on Daily Activities Stiff shoulder affects reaching, behind the back and overhead function    Multiple Pain Sites No              [facility] PT Assessment - 12/27/20 0001      AROM   Right Shoulder Flexion 150 Degrees    Right Shoulder Internal Rotation 60 Degrees    Right Shoulder External Rotation 100 Degrees    Right Shoulder Horizontal  ADduction 35 Degrees                         [facility] Adult PT Treatment/Exercise - 12/27/20 0001      Exercises   Exercises Shoulder      Shoulder Exercises: Supine   Internal Rotation AAROM;Right;20 reps;Other (comment)   10 seconds     Shoulder Exercises: Standing   External Rotation Strengthening;Right;10 reps;Theraband;Other (comment)   3 seconds hold and slow eccentrics, 2 sets   Theraband Level (Shoulder External Rotation)  Level 3 (Green)    External Rotation Limitations 2 sets    Other Standing Exercises Thumb up the back stretch 10X 10 seconds      Shoulder Exercises: Pulleys   Flexion 2 minutes    Scaption 2 minutes                  PT Education - 12/27/20 1053    Education Details Reviewed HEP with cues needed for correct performance.    Person(s) Educated Patient    Methods Explanation;Demonstration;Tactile cues;Verbal cues    Comprehension Verbal cues required;Need further instruction;Returned demonstration;Verbalized understanding;Tactile cues required            PT Short Term Goals - 12/27/20 1054      PT SHORT TERM GOAL #1   Title Patient will be I with HEP    Time 3    Period Weeks    Status On-going    Target Date 10/24/20      PT SHORT TERM GOAL #2   Title Patient will show improved ROM >120 for flex/abd for increased function of ADLs    Baseline 111 flex, 94 abd     Time 4    Period Weeks    Status Achieved    Target Date 10/31/20             PT Long Term Goals - 12/27/20 1055      PT LONG TERM GOAL #1   Title Improve R shoulder ER strength to 16 pounds.    Baseline 13.5 pounds    Time 8    Period Weeks    Status On-going      PT LONG TERM GOAL #2   Title Patient will be able to tolerate self grooming and grooming activity for 2 other clients without pain increasing over 2/10 on NPS    Baseline unable to do her own hair or 1 persons without overdoing it    Time 8    Period Weeks    Status On-going      PT LONG TERM GOAL #3   Title Patient will demonstrate AROM within normal limits for flexion, abduction, and internal rotation    Baseline All improving but need work    Time 8    Period Weeks    Status Partially Met      PT LONG TERM GOAL #4   Title Patient will show improed FOTO score    Baseline functional intake score 55, predicted 71%    Time 8    Period Weeks    Status On-going                 Plan - 12/27/20 1101    Clinical Impression Statement [name] reports fairly consistent HEP compliance.  IR AROM in particular is making great progress.  We reviewed and corrected technique with all exercises to continue early progress.  Continue POC.    Personal Factors and Comorbidities Profession    Examination-Activity Limitations Bathing;Reach Overhead;Lift;Dressing;Caring for Others;Carry    Examination-Participation Restrictions Occupation;Cleaning;Community Activity;Shop;Meal Prep    Stability/Clinical Decision Making Stable/Uncomplicated    Rehab Potential Excellent    PT Frequency 2x / week    PT Duration 8 weeks    PT Treatment/Interventions Vasopneumatic Device;Taping;Passive range of motion;Patient/family education;Manual techniques;Neuromuscular re-education;Therapeutic exercise;Therapeutic activities;Functional mobility training;Moist Heat;Electrical Stimulation;Cryotherapy;Biofeedback;ADLs/Self Care Home  Management    PT Next Visit Plan Continue strength progression and IR mobility    PT Home Exercise Plan Access code: [email or web address]  Consulted and Agree with Plan of Care Patient           Patient will benefit from skilled therapeutic intervention in order to improve the following deficits and impairments:  Impaired UE functional use,Decreased endurance,Decreased activity tolerance,Pain,Decreased mobility,Decreased strength  Visit Diagnosis: Stiffness of right shoulder, not elsewhere classified  Acute pain of right shoulder  Muscle weakness (generalized)  Localized edema     Problem List Patient Active Problem List   Diagnosis Date Noted  . 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 Rotator cuff impingement syndrome of right shoulder 07/31/2020  . Uterine fibroid 03/03/2016  . Fibroids, intramural 02/10/2016  . DVT (deep venous thrombosis) ([place]) 08/01/2014    Farley Ly PT, MPT 12/27/2020, 11:03 AM   PHYSICAL THERAPY DISCHARGE SUMMARY  Visits from Start of Care: 8  Current functional level related to goals / functional outcomes: See note   Remaining deficits: See note   Education / Equipment: HEP  Plan: Patient agrees to discharge.  Patient goals were partially met. Patient is being discharged due to not returning since the last visit.  ?????     Scot Jun, PT, DPT, OCS, ATC 02/04/21  10:45 AM    [facility] Physical Therapy [address] [place], [place], [address] Phone: [phone]   Fax:  [phone]  Name: [name] MRN: [id number] Date of Birth: 11-08-1965

## 2021-01-03 ENCOUNTER — Encounter: Payer: BC Managed Care – PPO | Admitting: Rehabilitative and Restorative Service Providers"

## 2021-01-10 ENCOUNTER — Encounter: Payer: BC Managed Care – PPO | Admitting: Rehabilitative and Restorative Service Providers"

## 2021-01-31 ENCOUNTER — Other Ambulatory Visit: Payer: Self-pay | Admitting: Physician Assistant

## 2022-04-09 NOTE — Progress Notes (Deleted)
[facility] [address] [place], [place] [address] [phone]  Date:  04/15/2022   Name:  [name]   DOB:  1966/06/18   MRN:  [id number]  PCP:  Darreld Mclean, MD    Chief Complaint: No chief complaint on file.   History of Present Illness:  [name] is a 56 y.o. 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 08/15/2020   Rotator cuff impingement syndrome of right shoulder 07/31/2020   Uterine fibroid 03/03/2016   Fibroids, intramural 02/10/2016   DVT (deep venous thrombosis) ([place]) 08/01/2014    Past Medical History:  Diagnosis Date   Anemia    Chicken pox    Left leg DVT ([place]) 2014   Uterine leiomyoma     Past Surgical History:  Procedure Laterality Date   BUNIONECTOMY Bilateral 2007   CESAREAN SECTION     x 2   ROBOTIC ASSISTED TOTAL HYSTERECTOMY WITH BILATERAL SALPINGO OOPHERECTOMY N/A 03/03/2016   Procedure: XI ROBOTIC ASSISTED TOTAL HYSTERECTOMY WITH BILATERAL Salipingectomy ;  Surgeon: Everitt Amber, MD;  Location: [facility];  Service: Gynecology;  Laterality: N/A;   TUBAL LIGATION      Social History   Tobacco Use   Smoking status: Every Day    Packs/day: 0.25    Years: 15.00    Total pack years: 3.75    Types: Cigarettes   Smokeless tobacco: Never  Substance Use Topics   Alcohol use: No   Drug use: No    Family History  Problem Relation Age of Onset   Diabetes Mother    Depression Mother    Stroke Mother    Diabetes Maternal Grandmother    Heart disease Maternal Grandfather    Tuberculosis Paternal Grandfather     No Known Allergies  Medication list has been reviewed and updated.  Current Outpatient Medications on File  Prior to Visit  Medication Sig Dispense Refill   Cholecalciferol (VITAMIN D3) 1.25 MG (50000 UT) CAPS Take 1 weekly for 12 weeks 12 capsule 0   HYDROcodone-acetaminophen (NORCO) 5-325 MG tablet Take 1-2 tablets by mouth 2 (two) times daily as needed. 30 tablet 0   ibuprofen (ADVIL) 800 MG tablet Take 1 tablet (800 mg total) by mouth every 8 (eight) hours as needed. 30 tablet 0   lidocaine (LIDODERM) 5 % Place 1 patch onto the skin daily. Remove & Discard patch within 12 hours or as directed by MD 30 patch 0   Multiple Vitamin (MULTIVITAMIN WITH MINERALS) TABS tablet Take 1 tablet by mouth daily.     ondansetron (ZOFRAN) 4 MG tablet Take 1 tablet (4 mg total) by mouth every 8 (eight) hours as needed for nausea or vomiting. 40 tablet 0   oxyCODONE-acetaminophen (PERCOCET) 5-325 MG tablet Take 1-2 tablets by mouth every 6 (six) hours as needed. 40 tablet 0   No current facility-administered medications on file prior to visit.    Review of Systems:  As per HPI- otherwise negative.   Physical Examination: There were no vitals filed for this visit. There were no vitals filed for this visit. There is no height or weight on file to calculate BMI. Ideal  Body Weight:    GEN: no acute distress. HEENT: Atraumatic, Normocephalic.  Ears and Nose: No external deformity. CV: RRR, No M/G/R. No JVD. No thrill. No extra heart sounds. PULM: CTA B, no wheezes, crackles, rhonchi. No retractions. No resp. distress. No accessory muscle use. ABD: S, NT, ND, +BS. No rebound. No HSM. EXTR: No c/c/e PSYCH: Normally interactive. Conversant.    Assessment and Plan: ***  Signed Lamar Blinks, MD

## 2022-04-15 ENCOUNTER — Ambulatory Visit: Payer: BC Managed Care – PPO | Admitting: Family Medicine

## 2022-06-24 ENCOUNTER — Inpatient Hospital Stay (HOSPITAL_COMMUNITY): Payer: BC Managed Care – PPO

## 2022-06-24 ENCOUNTER — Emergency Department (HOSPITAL_COMMUNITY): Payer: BC Managed Care – PPO

## 2022-06-24 ENCOUNTER — Encounter (HOSPITAL_COMMUNITY)
Admission: EM | Disposition: A | Discharge status: Home / Self Care | Payer: Self-pay | Source: Home / Self Care | Attending: Interventional Cardiology

## 2022-06-24 ENCOUNTER — Encounter (HOSPITAL_COMMUNITY): Payer: Self-pay

## 2022-06-24 ENCOUNTER — Inpatient Hospital Stay (HOSPITAL_COMMUNITY)
Admission: EM | Admit: 2022-06-24 | Discharge: 2022-06-26 | DRG: 322 | Disposition: A | Discharge status: Home / Self Care | Payer: BC Managed Care – PPO | Attending: Interventional Cardiology | Admitting: Interventional Cardiology

## 2022-06-24 DIAGNOSIS — Z823 Family history of stroke: Secondary | ICD-10-CM | POA: Diagnosis not present

## 2022-06-24 DIAGNOSIS — I213 ST elevation (STEMI) myocardial infarction of unspecified site: Secondary | ICD-10-CM | POA: Diagnosis present

## 2022-06-24 DIAGNOSIS — Z8249 Family history of ischemic heart disease and other diseases of the circulatory system: Secondary | ICD-10-CM

## 2022-06-24 DIAGNOSIS — E785 Hyperlipidemia, unspecified: Secondary | ICD-10-CM

## 2022-06-24 DIAGNOSIS — Z86718 Personal history of other venous thrombosis and embolism: Secondary | ICD-10-CM | POA: Diagnosis not present

## 2022-06-24 DIAGNOSIS — Z79899 Other long term (current) drug therapy: Secondary | ICD-10-CM | POA: Diagnosis not present

## 2022-06-24 DIAGNOSIS — I2111 ST elevation (STEMI) myocardial infarction involving right coronary artery: Secondary | ICD-10-CM

## 2022-06-24 DIAGNOSIS — I2119 ST elevation (STEMI) myocardial infarction involving other coronary artery of inferior wall: Secondary | ICD-10-CM | POA: Diagnosis present

## 2022-06-24 DIAGNOSIS — Z720 Tobacco use: Secondary | ICD-10-CM | POA: Diagnosis present

## 2022-06-24 DIAGNOSIS — Z833 Family history of diabetes mellitus: Secondary | ICD-10-CM | POA: Diagnosis not present

## 2022-06-24 DIAGNOSIS — E7849 Other hyperlipidemia: Secondary | ICD-10-CM | POA: Diagnosis present

## 2022-06-24 DIAGNOSIS — R7303 Prediabetes: Secondary | ICD-10-CM | POA: Diagnosis present

## 2022-06-24 DIAGNOSIS — O1490 Unspecified pre-eclampsia, unspecified trimester: Secondary | ICD-10-CM | POA: Diagnosis not present

## 2022-06-24 DIAGNOSIS — Z955 Presence of coronary angioplasty implant and graft: Secondary | ICD-10-CM | POA: Diagnosis not present

## 2022-06-24 DIAGNOSIS — O24419 Gestational diabetes mellitus in pregnancy, unspecified control: Secondary | ICD-10-CM | POA: Diagnosis present

## 2022-06-24 DIAGNOSIS — I25110 Atherosclerotic heart disease of native coronary artery with unstable angina pectoris: Secondary | ICD-10-CM | POA: Diagnosis present

## 2022-06-24 DIAGNOSIS — I2510 Atherosclerotic heart disease of native coronary artery without angina pectoris: Secondary | ICD-10-CM | POA: Diagnosis not present

## 2022-06-24 DIAGNOSIS — Z8632 Personal history of gestational diabetes: Secondary | ICD-10-CM | POA: Diagnosis not present

## 2022-06-24 DIAGNOSIS — Z818 Family history of other mental and behavioral disorders: Secondary | ICD-10-CM

## 2022-06-24 DIAGNOSIS — F17210 Nicotine dependence, cigarettes, uncomplicated: Secondary | ICD-10-CM | POA: Diagnosis present

## 2022-06-24 HISTORY — DX: ST elevation (STEMI) myocardial infarction involving right coronary artery: I21.11

## 2022-06-24 HISTORY — PX: CORONARY/GRAFT ACUTE MI REVASCULARIZATION: CATH118305

## 2022-06-24 HISTORY — DX: Atherosclerotic heart disease of native coronary artery with unstable angina pectoris: I25.110

## 2022-06-24 LAB — POCT I-STAT, CHEM 8
BUN: 11 mg/dL (ref 6–20)
Calcium, Ion: 1.17 mmol/L (ref 1.15–1.40)
Chloride: 104 mmol/L (ref 98–111)
Creatinine, Ser: 0.7 mg/dL (ref 0.44–1.00)
Glucose, Bld: 103 mg/dL — ABNORMAL HIGH (ref 70–99)
HCT: 35 % — ABNORMAL LOW (ref 36.0–46.0)
Hemoglobin: 11.9 g/dL — ABNORMAL LOW (ref 12.0–15.0)
Potassium: 3.5 mmol/L (ref 3.5–5.1)
Sodium: 141 mmol/L (ref 135–145)
TCO2: 29 mmol/L (ref 22–32)

## 2022-06-24 LAB — CREATININE, SERUM
Creatinine, Ser: 0.87 mg/dL (ref 0.44–1.00)
GFR, Estimated: >60 mL/min

## 2022-06-24 LAB — COMPREHENSIVE METABOLIC PANEL
ALT: 13 U/L (ref 0–44)
AST: 20 U/L (ref 15–41)
Albumin: 3.7 g/dL (ref 3.5–5.0)
Alkaline Phosphatase: 62 U/L (ref 38–126)
Anion gap: 8 (ref 5–15)
BUN: 14 mg/dL (ref 6–20)
CO2: 24 mmol/L (ref 22–32)
Calcium: 9 mg/dL (ref 8.9–10.3)
Chloride: 106 mmol/L (ref 98–111)
Creatinine, Ser: 0.72 mg/dL (ref 0.44–1.00)
GFR, Estimated: >60 mL/min (ref >60)
Glucose, Bld: 105 mg/dL — ABNORMAL HIGH (ref 70–99)
Potassium: 3.5 mmol/L (ref 3.5–5.1)
Sodium: 138 mmol/L (ref 135–145)
Total Bilirubin: 0.5 mg/dL (ref 0.3–1.2)
Total Protein: 7.6 g/dL (ref 6.5–8.1)

## 2022-06-24 LAB — BASIC METABOLIC PANEL
Anion gap: 9 (ref 5–15)
BUN: 15 mg/dL (ref 6–20)
CO2: 23 mmol/L (ref 22–32)
Calcium: 9.3 mg/dL (ref 8.9–10.3)
Chloride: 107 mmol/L (ref 98–111)
Creatinine, Ser: 0.85 mg/dL (ref 0.44–1.00)
GFR, Estimated: >60 mL/min (ref >60)
Glucose, Bld: 113 mg/dL — ABNORMAL HIGH (ref 70–99)
Potassium: 3.6 mmol/L (ref 3.5–5.1)
Sodium: 139 mmol/L (ref 135–145)

## 2022-06-24 LAB — CBC
HCT: 39.1 % (ref 36.0–46.0)
HCT: 35 % — ABNORMAL LOW (ref 36.0–46.0)
Hemoglobin: 12.8 g/dL (ref 12.0–15.0)
Hemoglobin: 11.7 g/dL — ABNORMAL LOW (ref 12.0–15.0)
MCH: 29.6 pg (ref 26.0–34.0)
MCH: 29.8 pg (ref 26.0–34.0)
MCHC: 32.7 g/dL (ref 30.0–36.0)
MCHC: 33.4 g/dL (ref 30.0–36.0)
MCV: 90.3 fL (ref 80.0–100.0)
MCV: 89.3 fL (ref 80.0–100.0)
Platelets: 249 10*3/uL (ref 150–400)
Platelets: 224 K/uL (ref 150–400)
RBC: 4.33 MIL/uL (ref 3.87–5.11)
RBC: 3.92 MIL/uL (ref 3.87–5.11)
RDW: 14.3 % (ref 11.5–15.5)
RDW: 14.5 % (ref 11.5–15.5)
WBC: 3.5 10*3/uL — ABNORMAL LOW (ref 4.0–10.5)
WBC: 4.3 K/uL (ref 4.0–10.5)
nRBC: 0 % (ref 0.0–0.2)
nRBC: 0 % (ref 0.0–0.2)

## 2022-06-24 LAB — TROPONIN I (HIGH SENSITIVITY)
Troponin I (High Sensitivity): 521 ng/L (ref <18)
Troponin I (High Sensitivity): 581 ng/L (ref <18)
Troponin I (High Sensitivity): 7337 ng/L (ref <18)

## 2022-06-24 LAB — ECHOCARDIOGRAM COMPLETE
Area-P 1/2: 3.97 cm2
Height: 66 in
S' Lateral: 2.6 cm
Weight: 2304 [oz_av]

## 2022-06-24 LAB — PROTIME-INR
INR: 1.1 (ref 0.8–1.2)
Prothrombin Time: 14.1 seconds (ref 11.4–15.2)

## 2022-06-24 LAB — LIPID PANEL
Cholesterol: 257 mg/dL — ABNORMAL HIGH (ref 0–200)
HDL: 62 mg/dL (ref >40)
LDL Cholesterol: 184 mg/dL — ABNORMAL HIGH (ref 0–99)
Total CHOL/HDL Ratio: 4.1 RATIO
Triglycerides: 57 mg/dL (ref <150)
VLDL: 11 mg/dL (ref 0–40)

## 2022-06-24 LAB — I-STAT BETA HCG BLOOD, ED (MC, WL, AP ONLY): I-stat hCG, quantitative: <5 m[IU]/mL (ref <5)

## 2022-06-24 LAB — HEMOGLOBIN A1C
Hgb A1c MFr Bld: 6.1 % — ABNORMAL HIGH (ref 4.8–5.6)
Mean Plasma Glucose: 128.37 mg/dL

## 2022-06-24 LAB — APTT: aPTT: 29 seconds (ref 24–36)

## 2022-06-24 LAB — HEPARIN LEVEL (UNFRACTIONATED): Heparin Unfractionated: <0.1 IU/mL — ABNORMAL LOW (ref 0.30–0.70)

## 2022-06-24 SURGERY — CORONARY/GRAFT ACUTE MI REVASCULARIZATION
Anesthesia: LOCAL

## 2022-06-24 MED ORDER — VERAPAMIL HCL 2.5 MG/ML IV SOLN
INTRAVENOUS | Status: DC | PRN
  Administered 2022-06-24: 10 mL via INTRA_ARTERIAL

## 2022-06-24 MED ORDER — NOREPINEPHRINE 4 MG/250ML-% IV SOLN: 2.0000 ug/min | INTRAVENOUS | Status: DC

## 2022-06-24 MED ORDER — FENTANYL CITRATE (PF) 100 MCG/2ML IJ SOLN
INTRAMUSCULAR | Status: DC | PRN
  Administered 2022-06-24: 25 ug via INTRAVENOUS

## 2022-06-24 MED ORDER — CHLORHEXIDINE GLUCONATE CLOTH 2 % EX PADS
6.0000 | MEDICATED_PAD | Freq: Every day | CUTANEOUS | Status: DC
  Administered 2022-06-24 – 2022-06-25 (×2): 6 via TOPICAL

## 2022-06-24 MED ORDER — ONDANSETRON HCL 4 MG/2ML IJ SOLN
4.0000 mg | Freq: Once | INTRAMUSCULAR | Status: AC
  Administered 2022-06-24: 4 mg via INTRAVENOUS
  Filled 2022-06-24: qty 2

## 2022-06-24 MED ORDER — HEPARIN (PORCINE) IN NACL 1000-0.9 UT/500ML-% IV SOLN
INTRAVENOUS | Status: DC | PRN
  Administered 2022-06-24 (×2): 500 mL

## 2022-06-24 MED ORDER — ATROPINE SULFATE 1 MG/10ML IJ SOSY
PREFILLED_SYRINGE | INTRAMUSCULAR | Status: DC | PRN
  Administered 2022-06-24 (×2): .5 mg via INTRAVENOUS

## 2022-06-24 MED ORDER — SODIUM CHLORIDE 0.9% FLUSH
3.0000 mL | INTRAVENOUS | Status: DC | PRN
  Administered 2022-06-24: 3 mL via INTRAVENOUS

## 2022-06-24 MED ORDER — ONDANSETRON HCL 4 MG/2ML IJ SOLN
INTRAMUSCULAR | Status: AC
  Filled 2022-06-24: qty 2

## 2022-06-24 MED ORDER — MELATONIN 3 MG PO TABS
3.0000 mg | ORAL_TABLET | Freq: Every day | ORAL | Status: DC
  Administered 2022-06-24 – 2022-06-25 (×2): 3 mg via ORAL
  Filled 2022-06-24 (×2): qty 1

## 2022-06-24 MED ORDER — NITROGLYCERIN 1 MG/10 ML FOR IR/CATH LAB
INTRA_ARTERIAL | Status: DC | PRN
  Administered 2022-06-24: 100 ug via INTRACORONARY

## 2022-06-24 MED ORDER — ONDANSETRON HCL 4 MG/2ML IJ SOLN: 4.0000 mg | Freq: Four times a day (QID) | INTRAMUSCULAR | Status: DC | PRN

## 2022-06-24 MED ORDER — HEPARIN (PORCINE) IN NACL 1000-0.9 UT/500ML-% IV SOLN
INTRAVENOUS | Status: AC
  Filled 2022-06-24: qty 500

## 2022-06-24 MED ORDER — SODIUM CHLORIDE 0.9 % IV SOLN
INTRAVENOUS | Status: AC | PRN
  Administered 2022-06-24: 250 mL via INTRAVENOUS

## 2022-06-24 MED ORDER — ASPIRIN 81 MG PO CHEW
324.0000 mg | CHEWABLE_TABLET | Freq: Once | ORAL | Status: AC
  Administered 2022-06-24: 324 mg via ORAL
  Filled 2022-06-24: qty 4

## 2022-06-24 MED ORDER — SODIUM CHLORIDE 0.9 % IV SOLN: INTRAVENOUS | Status: AC

## 2022-06-24 MED ORDER — NITROGLYCERIN 0.4 MG SL SUBL
0.4000 mg | SUBLINGUAL_TABLET | SUBLINGUAL | Status: DC | PRN
  Filled 2022-06-24: qty 1

## 2022-06-24 MED ORDER — NOREPINEPHRINE 4 MG/250ML-% IV SOLN: 0.0000 ug/min | INTRAVENOUS | Status: DC

## 2022-06-24 MED ORDER — HEPARIN SODIUM (PORCINE) 1000 UNIT/ML IJ SOLN
INTRAMUSCULAR | Status: DC | PRN
  Administered 2022-06-24: 5000 [IU] via INTRAVENOUS
  Administered 2022-06-24: 2000 [IU] via INTRAVENOUS

## 2022-06-24 MED ORDER — MIDAZOLAM HCL 2 MG/2ML IJ SOLN
INTRAMUSCULAR | Status: DC | PRN
  Administered 2022-06-24: 1 mg via INTRAVENOUS

## 2022-06-24 MED ORDER — FENTANYL CITRATE (PF) 100 MCG/2ML IJ SOLN
INTRAMUSCULAR | Status: AC
  Filled 2022-06-24: qty 2

## 2022-06-24 MED ORDER — HEPARIN SODIUM (PORCINE) 1000 UNIT/ML IJ SOLN
INTRAMUSCULAR | Status: AC
  Filled 2022-06-24: qty 10

## 2022-06-24 MED ORDER — ATORVASTATIN CALCIUM 80 MG PO TABS
80.0000 mg | ORAL_TABLET | Freq: Every day | ORAL | Status: DC
  Administered 2022-06-24 – 2022-06-26 (×3): 80 mg via ORAL
  Filled 2022-06-24 (×3): qty 1

## 2022-06-24 MED ORDER — LABETALOL HCL 5 MG/ML IV SOLN: 10.0000 mg | INTRAVENOUS | Status: AC | PRN

## 2022-06-24 MED ORDER — LIDOCAINE HCL (PF) 1 % IJ SOLN
INTRAMUSCULAR | Status: AC
  Filled 2022-06-24: qty 30

## 2022-06-24 MED ORDER — HEPARIN SODIUM (PORCINE) 5000 UNIT/ML IJ SOLN
INTRAMUSCULAR | Status: AC
  Filled 2022-06-24: qty 1

## 2022-06-24 MED ORDER — HEPARIN SODIUM (PORCINE) 5000 UNIT/ML IJ SOLN: 60.0000 [IU]/kg | Freq: Once | INTRAMUSCULAR | Status: DC

## 2022-06-24 MED ORDER — SODIUM CHLORIDE 0.9 % IV BOLUS
INTRAVENOUS | Status: AC | PRN
  Administered 2022-06-24: 250 mL via INTRAVENOUS

## 2022-06-24 MED ORDER — ALUM & MAG HYDROXIDE-SIMETH 200-200-20 MG/5ML PO SUSP
ORAL | Status: AC
  Filled 2022-06-24: qty 30

## 2022-06-24 MED ORDER — ASPIRIN 81 MG PO CHEW
81.0000 mg | CHEWABLE_TABLET | Freq: Every day | ORAL | Status: DC
  Administered 2022-06-25 – 2022-06-26 (×2): 81 mg via ORAL
  Filled 2022-06-24 (×2): qty 1

## 2022-06-24 MED ORDER — VERAPAMIL HCL 2.5 MG/ML IV SOLN
INTRAVENOUS | Status: AC
  Filled 2022-06-24: qty 2

## 2022-06-24 MED ORDER — ACETAMINOPHEN 325 MG PO TABS
650.0000 mg | ORAL_TABLET | ORAL | Status: DC | PRN
  Administered 2022-06-25: 650 mg via ORAL
  Filled 2022-06-24: qty 2

## 2022-06-24 MED ORDER — SODIUM CHLORIDE 0.9% FLUSH
3.0000 mL | Freq: Two times a day (BID) | INTRAVENOUS | Status: DC
  Administered 2022-06-24 – 2022-06-26 (×3): 3 mL via INTRAVENOUS

## 2022-06-24 MED ORDER — ONDANSETRON HCL 4 MG/2ML IJ SOLN
INTRAMUSCULAR | Status: DC | PRN
  Administered 2022-06-24: 4 mg via INTRAVENOUS

## 2022-06-24 MED ORDER — HEPARIN (PORCINE) 25000 UT/250ML-% IV SOLN
1000.0000 [IU]/h | INTRAVENOUS | Status: DC
  Administered 2022-06-24: 1000 [IU]/h via INTRAVENOUS
  Filled 2022-06-24: qty 250

## 2022-06-24 MED ORDER — TICAGRELOR 90 MG PO TABS
ORAL_TABLET | ORAL | Status: AC
  Filled 2022-06-24: qty 1

## 2022-06-24 MED ORDER — ORAL CARE MOUTH RINSE: 15.0000 mL | OROMUCOSAL | Status: DC | PRN

## 2022-06-24 MED ORDER — HYDRALAZINE HCL 20 MG/ML IJ SOLN: 10.0000 mg | INTRAMUSCULAR | Status: AC | PRN

## 2022-06-24 MED ORDER — NOREPINEPHRINE 4 MG/250ML-% IV SOLN
INTRAVENOUS | Status: AC
  Filled 2022-06-24: qty 250

## 2022-06-24 MED ORDER — SODIUM CHLORIDE 0.9 % IV SOLN: 250.0000 mL | INTRAVENOUS | Status: DC

## 2022-06-24 MED ORDER — ENOXAPARIN SODIUM 40 MG/0.4ML IJ SOSY
40.0000 mg | PREFILLED_SYRINGE | INTRAMUSCULAR | Status: DC
  Administered 2022-06-25: 40 mg via SUBCUTANEOUS
  Filled 2022-06-24: qty 0.4

## 2022-06-24 MED ORDER — TICAGRELOR 90 MG PO TABS
90.0000 mg | ORAL_TABLET | Freq: Two times a day (BID) | ORAL | Status: DC
  Administered 2022-06-24 – 2022-06-26 (×4): 90 mg via ORAL
  Filled 2022-06-24 (×4): qty 1

## 2022-06-24 MED ORDER — TICAGRELOR 90 MG PO TABS
ORAL_TABLET | ORAL | Status: DC | PRN
  Administered 2022-06-24: 180 mg via ORAL

## 2022-06-24 MED ORDER — MIDAZOLAM HCL 2 MG/2ML IJ SOLN
INTRAMUSCULAR | Status: AC
  Filled 2022-06-24: qty 2

## 2022-06-24 MED ORDER — HEPARIN BOLUS VIA INFUSION
4000.0000 [IU] | Freq: Once | INTRAVENOUS | Status: AC
  Administered 2022-06-24: 4000 [IU] via INTRAVENOUS
  Filled 2022-06-24: qty 4000

## 2022-06-24 MED ORDER — NOREPINEPHRINE BITARTRATE 1 MG/ML IV SOLN
INTRAVENOUS | Status: AC | PRN
  Administered 2022-06-24: 10 ug/min via INTRAVENOUS

## 2022-06-24 MED ORDER — NITROGLYCERIN 1 MG/10 ML FOR IR/CATH LAB
INTRA_ARTERIAL | Status: AC
  Filled 2022-06-24: qty 10

## 2022-06-24 MED ORDER — SODIUM CHLORIDE 0.9 % IV SOLN: 250.0000 mL | INTRAVENOUS | Status: DC | PRN

## 2022-06-24 MED ORDER — STERILE WATER FOR INJECTION IJ SOLN
INTRAMUSCULAR | Status: AC
  Filled 2022-06-24: qty 10

## 2022-06-24 MED ORDER — OXYCODONE HCL 5 MG PO TABS: 5.0000 mg | ORAL_TABLET | ORAL | Status: DC | PRN

## 2022-06-24 MED ORDER — IOHEXOL 350 MG/ML SOLN
INTRAVENOUS | Status: DC | PRN
  Administered 2022-06-24: 115 mL

## 2022-06-24 MED ORDER — ATROPINE SULFATE 1 MG/10ML IJ SOSY
PREFILLED_SYRINGE | INTRAMUSCULAR | Status: AC
  Filled 2022-06-24: qty 10

## 2022-06-24 MED ORDER — LIDOCAINE HCL (PF) 1 % IJ SOLN
INTRAMUSCULAR | Status: DC | PRN
  Administered 2022-06-24: 2 mL

## 2022-06-24 MED ORDER — ALUM & MAG HYDROXIDE-SIMETH 200-200-20 MG/5ML PO SUSP
30.0000 mL | ORAL | Status: DC | PRN
  Administered 2022-06-24: 30 mL via ORAL

## 2022-06-24 SURGICAL SUPPLY — 20 items
BALL SAPPHIRE NC24 3.0X12 (BALLOONS) ×1
BALLN SAPPHIRE 2.5X15 (BALLOONS) ×1
BALLOON SAPPHIRE 2.5X15 (BALLOONS) IMPLANT
BALLOON SAPPHIRE NC24 3.0X12 (BALLOONS) IMPLANT
CATH 5FR JL3.5 JR4 ANG PIG MP (CATHETERS) IMPLANT
CATH VISTA GUIDE 6FR JR4 (CATHETERS) IMPLANT
DEVICE RAD COMP TR BAND LRG (VASCULAR PRODUCTS) IMPLANT
ELECT DEFIB PAD ADLT CADENCE (PAD) IMPLANT
GLIDESHEATH SLEND A-KIT 6F 22G (SHEATH) IMPLANT
GUIDEWIRE INQWIRE 1.5J.035X260 (WIRE) IMPLANT
INQWIRE 1.5J .035X260CM (WIRE) ×1
KIT ENCORE 26 ADVANTAGE (KITS) IMPLANT
KIT HEART LEFT (KITS) ×1 IMPLANT
PACK CARDIAC CATHETERIZATION (CUSTOM PROCEDURE TRAY) ×1 IMPLANT
SHEATH PROBE COVER 6X72 (BAG) IMPLANT
STENT SYNERGY XD 2.75X16 (Permanent Stent) IMPLANT
SYNERGY XD 2.75X16 (Permanent Stent) ×1 IMPLANT
TRANSDUCER W/STOPCOCK (MISCELLANEOUS) ×1 IMPLANT
TUBING CIL FLEX 10 FLL-RA (TUBING) ×1 IMPLANT
WIRE ASAHI PROWATER 180CM (WIRE) IMPLANT

## 2022-06-24 NOTE — H&P (Signed)
Critical care time 35 minutes   Cardiology Admission History and Physical   Patient ID: [name] MRN: [id number]; DOB: 1966/04/30   Admission date: 06/24/2022  PCP:  Darreld Mclean, MD   [place] Providers Cardiologist: None Click here to update MD or APP on Care Team, Refresh:1}     Chief Complaint: STEMI activation  Patient Profile:   [name] is a 56 y.o. female with no prior heart history who is being seen 06/24/2022 for the evaluation of inferior ST elevation MI.  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  The discomfort became very severe and protracted after 4 AM.  Her husband drove her to the emergency room where initial EKGs were suspicious for ongoing inferior ischemia but not diagnostic of STEMI.  The pain worsened and she had diagnostic inferior ST elevation leading to STEMI activation at around 6:45 AM.  No prior history of heart disease.  She does smoke intermittently, had pregnancy related gestational diabetes and preeclampsia, hyperlipidemia untreated, and prediabetes (A1c 6.3 2021).   Past Medical History:  Diagnosis Date   Anemia    Chicken pox    Left leg DVT ([place]) 2014   Uterine leiomyoma     Past Surgical History:  Procedure Laterality Date   BUNIONECTOMY Bilateral 2007   CESAREAN SECTION     x 2   ROBOTIC ASSISTED TOTAL HYSTERECTOMY WITH BILATERAL SALPINGO OOPHERECTOMY N/A 03/03/2016   Procedure: XI ROBOTIC ASSISTED TOTAL HYSTERECTOMY WITH BILATERAL Salipingectomy ;  Surgeon: Everitt Amber, MD;  Location: [facility];  Service: Gynecology;  Laterality: N/A;   TUBAL LIGATION       Medications Prior to Admission: Prior to Admission medications   Medication Sig Start Date End Date Taking? Authorizing Provider  Cholecalciferol (VITAMIN D3) 1.25 MG (50000 UT) CAPS Take 1 weekly for 12 weeks  07/29/20   Copland, Gay Filler, MD  HYDROcodone-acetaminophen (NORCO) 5-325 MG tablet Take 1-2 tablets by mouth 2 (two) times daily as needed. 08/22/20   Aundra Dubin, PA-C  ibuprofen (ADVIL) 800 MG tablet Take 1 tablet (800 mg total) by mouth every 8 (eight) hours as needed. 11/29/20   Aundra Dubin, PA-C  lidocaine (LIDODERM) 5 % Place 1 patch onto the skin daily. Remove & Discard patch within 12 hours or as directed by MD 06/07/20   Aundra Dubin, PA-C  Multiple Vitamin (MULTIVITAMIN WITH MINERALS) TABS tablet Take 1 tablet by mouth daily.    [provider]  ondansetron (ZOFRAN) 4 MG tablet Take 1 tablet (4 mg total) by mouth every 8 (eight) hours as needed for nausea or vomiting. 08/12/20   Aundra Dubin, PA-C  oxyCODONE-acetaminophen (PERCOCET) 5-325 MG tablet Take 1-2 tablets by mouth every 6 (six) hours as needed. 08/12/20   Aundra Dubin, PA-C     Allergies:   No Known Allergies  Social History:   Social History   Socioeconomic History   Marital status: Married    Spouse name: [name]    Number of children: 2   Years of education: 12+   Highest education level: Not on file  Occupational History   Not on file  Tobacco Use   Smoking status: Every Day    Packs/day: 0.25    Years: 15.00    Total pack years: 3.75    Types: Cigarettes   Smokeless  tobacco: Never  Substance and Sexual Activity   Alcohol use: No   Drug use: No   Sexual activity: Not on file  Other Topics Concern   Not on file  Social History Narrative   Patient lives at home with husband and 2 children. [name]   Patient works at [employer].    Patient has a college education.    Patient is right handed.    Social Determinants of Health   Financial Resource Strain: Not on file  Food Insecurity: Not on file  Transportation Needs: Not on file  Physical Activity: Not on file  Stress: Not on file  Social Connections: Not on file  Intimate Partner Violence: Not on file    Family History:    The patient's family history includes Depression in her mother; Diabetes in her maternal grandmother and mother; Heart disease in her maternal grandfather; Stroke in her mother; Tuberculosis in her paternal grandfather.    ROS:  Please see the history of present illness.  She admits to intermittent nocturnal indigestion for weeks to months.  All other ROS reviewed and negative.     Physical Exam/Data:   Vitals:   06/24/22 0805 06/24/22 0810 06/24/22 0815 06/24/22 0820  BP: 109/77 111/80 105/70 104/75  Pulse: 97 95 97 96  Resp: 19 17 (!) 34 (!) 21  Temp:      TempSrc:      SpO2: 100% 100% 100% 100%  Weight:      Height:       No intake or output data in the 24 hours ending 06/24/22 0851    06/24/2022    6:21 AM 07/29/2020   10:08 AM 06/07/2020   10:17 AM  Last 3 Weights  Weight (lbs) 144 lb 149 lb 145 lb  Weight (kg) 65.318 kg 67.586 kg 65.772 kg     Body mass index is 23.24 kg/m.  General:  Well nourished, well developed, in no acute distress HEENT: normal Neck: no JVD Vascular: No carotid bruits; Distal pulses 2+ bilaterally   Cardiac:  normal S1, S2; RRR; no murmur. Lungs:  clear to auscultation bilaterally, no wheezing, rhonchi or rales  Abd: soft, nontender, no hepatomegaly  Ext: no edema Musculoskeletal:  No deformities, BUE and BLE strength normal and equal Skin: warm and dry  Neuro:  CNs 2-12 intact, no focal abnormalities noted Psych:  Normal affect    EKG:  The ECG that was done 6:42 AM was personally reviewed and demonstrates demonstrates inferior 1/2 to 1 mm ST elevation with convex ST segments and reciprocal change in 1, aVL, and lateral precordial leads.  Findings were consistent with an acute inferior STEMI.  Relevant CV Studies: No prior data  Laboratory Data:  High Sensitivity Troponin:   Recent Labs  Lab 06/24/22 0643 06/24/22 0652  TROPONINIHS 581* 521*      Chemistry Recent Labs  Lab 06/24/22 0643 06/24/22 0652  NA 139 138  K 3.6  3.5  CL 107 106  CO2 23 24  GLUCOSE 113* 105*  BUN 15 14  CREATININE 0.85 0.72  CALCIUM 9.3 9.0  GFRNONAA >60 >60  ANIONGAP 9 8    Recent Labs  Lab 06/24/22 0652  PROT 7.6  ALBUMIN 3.7  AST 20  ALT 13  ALKPHOS 62  BILITOT 0.5   Lipids  Recent Labs  Lab 06/24/22 0652  CHOL 257*  TRIG 57  HDL 62  LDLCALC 184*  CHOLHDL 4.1   Hematology Recent Labs  Lab 06/24/22  0643  WBC 3.5*  RBC 4.33  HGB 12.8  HCT 39.1  MCV 90.3  MCH 29.6  MCHC 32.7  RDW 14.3  PLT 249   Thyroid No results for input(s): "TSH", "FREET4" in the last 168 hours. BNPNo results for input(s): "BNP", "PROBNP" in the last 168 hours.  DDimer No results for input(s): "DDIMER" in the last 168 hours.   Radiology/Studies:  No results found.   Assessment and Plan:   Inferior ST segment elevation myocardial infarction: Patient was met in the emergency room and a history and exam were performed in an urgent fashion.  Recommendation for emergency catheterization with PCI and reperfusion therapy and stenting if indicated was discussed with the patient and accepted.  Emergency consent assumed. Prediabetes Severe hyperlipidemia, possibly familial Preeclampsia, gestational diabetes, in addition to above risk factors are noted.   The patient was counseled to undergo left heart catheterization, coronary angiography, and percutaneous coronary intervention with stent implantation. The procedural risks and benefits were discussed in detail. The risks discussed included death, stroke, myocardial infarction, life-threatening bleeding, limb ischemia, kidney injury, allergy, and possible emergency cardiac surgery. The risk of these significant complications were estimated to occur less than 1% of the time. After discussion, the patient has agreed to proceed.    Risk Assessment/Risk Scores:     TIMI Risk Score for ST  Elevation MI:   The patient's TIMI risk score is 1, which indicates a 1.6% risk of all cause  mortality at 30 days.         Severity of Illness: The appropriate patient status for this patient is OBSERVATION. Observation status is judged to be reasonable and necessary in order to provide the required intensity of service to ensure the patient's safety. The patient's presenting symptoms, physical exam findings, and initial radiographic and laboratory data in the context of their medical condition is felt to place them at decreased risk for further clinical deterioration. Furthermore, it is anticipated that the patient will be medically stable for discharge from the hospital within 2 midnights of admission.    For questions or updates, please contact [place] Please consult [email or web address] for contact info under     Signed, Sinclair Grooms, MD  06/24/2022 8:51 AM

## 2022-06-24 NOTE — Progress Notes (Signed)
Cards fellow [place] text-paged regarding pt's request for PRN for sleep. Awaiting further orders.

## 2022-06-24 NOTE — Progress Notes (Deleted)
[facility] [address] [place], [place] [address] [phone]  Date:  06/25/2022   Name:  [name]   DOB:  1966/01/16   MRN:  [id number]  PCP:  Darreld Mclean, MD    Chief Complaint: No chief complaint on file.   History of Present Illness:  [name] is a 56 y.o. 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 08/15/2020   Rotator cuff impingement syndrome of right shoulder 07/31/2020   Uterine fibroid 03/03/2016   Fibroids, intramural 02/10/2016   DVT (deep venous thrombosis) ([place]) 08/01/2014    Past Medical History:  Diagnosis Date   Anemia    Chicken pox    Left leg DVT ([place]) 2014   Uterine leiomyoma     Past Surgical History:  Procedure Laterality Date   BUNIONECTOMY Bilateral 2007   CESAREAN SECTION     x 2   CORONARY/GRAFT ACUTE MI REVASCULARIZATION N/A 06/24/2022   Procedure: Coronary/Graft Acute MI Revascularization;  Surgeon: Belva Crome, MD;  Location: [facility];  Service: Cardiovascular;  Laterality: N/A;   ROBOTIC ASSISTED TOTAL HYSTERECTOMY WITH BILATERAL SALPINGO OOPHERECTOMY N/A 03/03/2016   Procedure: XI ROBOTIC ASSISTED TOTAL HYSTERECTOMY WITH BILATERAL Salipingectomy ;  Surgeon: Everitt Amber, MD;  Location: [facility];  Service: Gynecology;  Laterality: N/A;   TUBAL LIGATION      Social History   Tobacco Use   Smoking status: Every Day    Packs/day: 0.25    Years: 15.00    Total pack years: 3.75    Types: Cigarettes    Smokeless tobacco: Never  Substance Use Topics   Alcohol use: No   Drug use: No    Family History  Problem Relation Age of Onset   Diabetes Mother    Depression Mother    Stroke Mother    Diabetes Maternal Grandmother    Heart disease Maternal Grandfather    Tuberculosis Paternal Grandfather     No Known Allergies  Medication list has been reviewed and updated.  Current Facility-Administered Medications on File Prior to Visit  Medication Dose Route Frequency Provider Last Rate Last Admin   0.9 %  sodium chloride infusion  250 mL Intravenous PRN Belva Crome, MD       0.9 %  sodium chloride infusion  250 mL Intravenous Continuous Einar Grad, RPH       acetaminophen (TYLENOL) tablet 650 mg  650 mg Oral Q4H PRN Belva Crome, MD       alum & mag hydroxide-simeth (MAALOX/MYLANTA) 200-200-20 MG/5ML suspension 30 mL  30 mL Oral Q4H PRN Belva Crome, MD   30 mL at 06/24/22 1422   [START ON 06/25/2022] aspirin chewable tablet 81 mg  81 mg Oral Daily Belva Crome, MD       atorvastatin (LIPITOR) tablet 80 mg  80 mg Oral Daily Belva Crome, MD   80 mg at 06/24/22 1733   Chlorhexidine Gluconate Cloth 2 % PADS 6 each  6 each Topical Daily  Belva Crome, MD       [START ON 06/25/2022] enoxaparin (LOVENOX) injection 40 mg  40 mg Subcutaneous Q24H Belva Crome, MD       norepinephrine (LEVOPHED) '4mg'$  in 24m (0.016 mg/mL) premix infusion  2-10 mcg/min Intravenous Titrated BEinar Grad RPH       ondansetron ([facility] injection 4 mg  4 mg Intravenous Q6H PRN SBelva Crome MD       Oral care mouth rinse  15 mL Mouth Rinse PRN SBelva Crome MD       oxyCODONE (Oxy IR/ROXICODONE) immediate release tablet 5-10 mg  5-10 mg Oral Q4H PRN SBelva Crome MD       sodium chloride flush (NS) 0.9 % injection 3 mL  3 mL Intravenous Q12H SBelva Crome MD       sodium chloride flush (NS) 0.9 % injection 3 mL  3 mL Intravenous PRN SBelva Crome MD       ticagrelor ([facility]  tablet 90 mg  90 mg Oral BID SBelva Crome MD       Current Outpatient Medications on File Prior to Visit  Medication Sig Dispense Refill   Aspirin-Salicylamide-Caffeine (BC HEADACHE POWDER PO) Take 1 packet by mouth daily as needed (for headaches).     Cholecalciferol (VITAMIN D3) 1.25 MG (50000 UT) CAPS Take 1 weekly for 12 weeks (Patient not taking: Reported on 06/24/2022) 12 capsule 0   HYDROcodone-acetaminophen (NORCO) 5-325 MG tablet Take 1-2 tablets by mouth 2 (two) times daily as needed. (Patient not taking: Reported on 06/24/2022) 30 tablet 0   ibuprofen (ADVIL) 800 MG tablet Take 1 tablet (800 mg total) by mouth every 8 (eight) hours as needed. (Patient not taking: Reported on 06/24/2022) 30 tablet 0   lidocaine (LIDODERM) 5 % Place 1 patch onto the skin daily. Remove & Discard patch within 12 hours or as directed by MD (Patient not taking: Reported on 06/24/2022) 30 patch 0   Multiple Vitamins-Minerals (ONE-A-DAY FOR HER VITACRAVES) CHEW Chew 1 tablet by mouth daily.     ondansetron (ZOFRAN) 4 MG tablet Take 1 tablet (4 mg total) by mouth every 8 (eight) hours as needed for nausea or vomiting. (Patient not taking: Reported on 06/24/2022) 40 tablet 0   oxyCODONE-acetaminophen (PERCOCET) 5-325 MG tablet Take 1-2 tablets by mouth every 6 (six) hours as needed. (Patient not taking: Reported on 06/24/2022) 40 tablet 0   THERAFLU FLU & SORE THROAT 20-10-650 MG PACK Take 1 packet by mouth every 6 (six) hours as needed (for cold-like symptoms, mixed into tea).      Review of Systems:  ***  Physical Examination: There were no vitals filed for this visit. There were no vitals filed for this visit. There is no height or weight on file to calculate BMI. Ideal Body Weight:    ***  Assessment and Plan: ***  Signed JLamar Blinks MD

## 2022-06-24 NOTE — ED Triage Notes (Signed)
Pt states that she has been having pain in her L shoulder and CP since last night with SOB, some nausea.

## 2022-06-24 NOTE — ED Provider Notes (Signed)
[facility] DEPT Provider Note   CSN: [id number] Arrival date & time: 06/24/22  9449     History  Chief Complaint  Patient presents with   Chest Pain    Ambriella Kitt is a 56 y.o. female.  The history is provided by the patient and medical records. No language interpreter was used.  Chest Pain    56 year old female with significant history of anemia, previous left leg DVT, who presents complaining of chest pain.  Patient reports since 24 AM yesterday she has been having pain in her chest.  She described pain as a dull achy pressure sensation to the left side of her chest radiates to her left arm with some associated lightheadedness, felt sweaty, and endorse some difficulty breathing.  Symptom has been waxing waning but not fully resolved.  Rated pain a 7 out of 10.  Denies any associated fever or chills no productive cough no runny nose sneezing no abdominal pain back pain or pleuritic chest pain.  She does endorse some mild nausea.  She did try drinking some ginger ale and taking some antacids with some improvement.  She denies any significant cardiac history.  She does smoke approximately 3 cigarettes a day.  She denies alcohol use.  She denies any exertional chest pain.  She has never had any stress test in the past  Home Medications Prior to Admission medications   Medication Sig Start Date End Date Taking? Authorizing Provider  Cholecalciferol (VITAMIN D3) 1.25 MG (50000 UT) CAPS Take 1 weekly for 12 weeks 07/29/20   Copland, Gay Filler, MD  HYDROcodone-acetaminophen (NORCO) 5-325 MG tablet Take 1-2 tablets by mouth 2 (two) times daily as needed. 08/22/20   Aundra Dubin, PA-C  ibuprofen (ADVIL) 800 MG tablet Take 1 tablet (800 mg total) by mouth every 8 (eight) hours as needed. 11/29/20   Aundra Dubin, PA-C  lidocaine (LIDODERM) 5 % Place 1 patch onto the skin daily. Remove & Discard patch within 12 hours or as directed by MD 06/07/20   Aundra Dubin, PA-C  Multiple Vitamin (MULTIVITAMIN WITH MINERALS) TABS tablet Take 1 tablet by mouth daily.    [provider]  ondansetron (ZOFRAN) 4 MG tablet Take 1 tablet (4 mg total) by mouth every 8 (eight) hours as needed for nausea or vomiting. 08/12/20   Aundra Dubin, PA-C  oxyCODONE-acetaminophen (PERCOCET) 5-325 MG tablet Take 1-2 tablets by mouth every 6 (six) hours as needed. 08/12/20   Aundra Dubin, PA-C      Allergies    Patient has no known allergies.    Review of Systems   Review of Systems  Cardiovascular:  Positive for chest pain.  All other systems reviewed and are negative.   Physical Exam Updated Vital Signs BP (!) 121/97   Pulse 69   Temp 97.8 F (36.6 C) (Oral)   Resp 16   LMP 03/01/2016   SpO2 100%  Physical Exam Vitals and nursing note reviewed.  Constitutional:      General: She is not in acute distress.    Appearance: She is well-developed.  HENT:     Head: Atraumatic.  Eyes:     Conjunctiva/sclera: Conjunctivae normal.  Pulmonary:     Effort: Pulmonary effort is normal.  Musculoskeletal:     Cervical back: Neck supple.  Skin:    Findings: No rash.  Neurological:     Mental Status: She is alert.  Psychiatric:  Mood and Affect: Mood normal.     ED Results / Procedures / Treatments   Labs (all labs ordered are listed, but only abnormal results are displayed) Labs Reviewed  CBC - Abnormal; Notable for the following components:      Result Value   WBC 3.5 (*)    All other components within normal limits  BASIC METABOLIC PANEL  HEMOGLOBIN A1C  PROTIME-INR  APTT  COMPREHENSIVE METABOLIC PANEL  LIPID PANEL  HEPARIN LEVEL (UNFRACTIONATED)  I-STAT BETA HCG BLOOD, ED ([facility], [facility], [facility] ONLY)  TROPONIN I (HIGH SENSITIVITY)  TROPONIN I (HIGH SENSITIVITY)    EKG EKG Interpretation  Date/Time:  Wednesday June 24 2022 06:26:50 EDT Ventricular Rate:  67 PR Interval:  158 QRS Duration: 64 QT Interval:  375 QTC  Calculation: 396 R Axis:   62 Text Interpretation: Sinus rhythm Repol abnrm suggests ischemia, anterolateral ST elevation, consider inferior injury Confirmed by Randal Buba, April ([id number]) on 06/24/2022 6:43:07 AM  Radiology No results found.  Procedures .Critical Care  Performed by: Domenic Moras, PA-C Authorized by: Domenic Moras, PA-C   Critical care provider statement:    Critical care time (minutes):  30   Critical care was time spent personally by me on the following activities:  Development of treatment plan with patient or surrogate, discussions with consultants, evaluation of patient's response to treatment, examination of patient, ordering and review of laboratory studies, ordering and review of radiographic studies, ordering and performing treatments and interventions, pulse oximetry, re-evaluation of patient's condition and review of old charts     Medications Ordered in ED Medications  heparin 5000 UNIT/ML injection (5,000 Units  Not Given 06/24/22 0653)  heparin ADULT infusion 100 units/mL (25000 units/268m) (1,000 Units/hr Intravenous New Bag/Given 06/24/22 0658)  aspirin chewable tablet 324 mg (324 mg Oral Given 06/24/22 0647)  ondansetron (ZOFRAN) injection 4 mg (4 mg Intravenous Given 06/24/22 05329  heparin bolus via infusion 4,000 Units (4,000 Units Intravenous Bolus from Bag 06/24/22 0659)    ED Course/ Medical Decision Making/ A&P                           Medical Decision Making  BP (!) 121/97   Pulse 69   Temp 97.8 F (36.6 C) (Oral)   Resp 16   Ht '5\' 6"'$  (1.676 m)   Wt 65.3 kg   LMP 03/01/2016   SpO2 100%   BMI 23.24 kg/m   7:02 AM This is a 56year old female without any prior history of cardiac disease.  She is here with complaints of chest pain.  She described pain as a pressure dull sensation on her left chest radiates to her left arm with associate nausea, diaphoresis, shortness of breath and lightheadedness.  Symptoms started around 11 AM yesterday  and have been waxing waning but persist.  She did tried antacid as well as ginger ale without adequate relief.  She has remote history of DVT, unprovoked.  She does not endorse any significant shortness of breath.  She denies any exertional chest pain.  On exam, patient appears mildly anxious but otherwise nontoxic and in no acute discomfort.  Vital signs are reassuring she is afebrile no tachycardia no hypoxia.  Initial EKG shows slight changes in the inferior leads but due to her concerning complaint, I would repeat EKG approximately 5 to 10 minutes later shows evidence of acute MI showed inferior changes and posterior changes.  Code STEMI was activated.  Patient given heparin.  Cardiologist was promptly consulted and patient immediately transferred to [facility], ER to ER with Dr. Quintella Reichert as the accepting doctor.  Due to the severity of the situation, chest x-ray was not obtained, and labs obtained but have not resulted yet. Care was done in conjunction with attending Dr. Randal Buba.  This patient presents to the ED for concern of chest pain, this involves an extensive number of treatment options, and is a complaint that carries with it a high risk of complications and morbidity.  The differential diagnosis includes acs, pericarditis, pe, pna, ptx, costochondritis, gerd  Co morbidities that complicate the patient evaluation tobacco use  Remote unprovoked dvt Additional history obtained:  Additional history obtained from husband External records from outside source obtained and reviewed including EMR  Lab Tests:  I Ordered, and personally interpreted labs.  The pertinent results include:  as above  Imaging Studies ordered:  I ordered imaging studies including CXR I independently visualized and interpreted imaging which showed inferior and posterior changes concerning for acute MI I agree with the radiologist interpretation  Cardiac Monitoring:  The patient was maintained on a  cardiac monitor.  I personally viewed and interpreted the cardiac monitored which showed an underlying rhythm of: NSR  Medicines ordered and prescription drug management:  I ordered medication including heparin  for MI Reevaluation of the patient after these medicines showed that the patient improved I have reviewed the patients home medicines and have made adjustments as needed  Test Considered: chest CTA  Critical Interventions: code STEMI  Consultations Obtained:  I requested consultation with the cardiologist Dr. Tamala Julian,  and discussed lab and imaging findings as well as pertinent plan - they recommend: transfer to [facility] to be evaluated  Problem List / ED Course: chest pain  Reevaluation:  After the interventions noted above, I reevaluated the patient and found that they have :improved  Social Determinants of Health: tobacco use, recommend cessation  Dispostion:  After consideration of the diagnostic results and the patients response to treatment, I feel that the patent would benefit from [place].          Final Clinical Impression(s) / ED Diagnoses Final diagnoses:  ST elevation myocardial infarction (STEMI), unspecified artery [facility])    Rx / DC Orders ED Discharge Orders     None         Domenic Moras, PA-C 06/24/22 0102    Palumbo, April, MD 06/24/22 2340

## 2022-06-24 NOTE — Progress Notes (Signed)
ANTICOAGULATION CONSULT NOTE - Initial Consult  Pharmacy Consult for Heparin Indication: chest pain/ACS  No Known Allergies  Patient Measurements: Height: '5\' 6"'$  (167.6 cm) Weight: 65.3 kg (144 lb) IBW/kg (Calculated) : 59.3 Heparin Dosing Weight: 65.3 kg  Vital Signs: Temp: 97.8 F (36.6 C) (10/18 0621) Temp Source: Oral (10/18 0621) BP: 121/97 (10/18 0621) Pulse Rate: 69 (10/18 0621)  Labs: Recent Labs    06/24/22 0643  HGB 12.8  HCT 39.1  PLT 249    CrCl cannot be calculated (Patient's most recent lab result is older than the maximum 21 days allowed.).   Medical History: Past Medical History:  Diagnosis Date   Anemia    Chicken pox    Left leg DVT ([place]) 2014   Uterine leiomyoma     Assessment: Active Problem(s): CP - dull achy pressure sensation to the left side of her chest radiates to her left arm with some associated lightheadedness, felt sweaty, and endorse some difficulty breathing.  PMH: anemia, previous left leg DVT, tobacco,   AC/Heme: IV heparin for CP.  H/o VTE so need to r/o DVT/PE. No current anticoagulation noted in refill history. Baseline CBC WNL.  Goal of Therapy:  Heparin level 0.3-0.7 units/ml Monitor platelets by anticoagulation protocol: Yes   Plan:  IV heparin 4000 unit bolus Heparin infusion 1000 units/hr Check heparin level in in 6-8 hrs Daily HL and CBC   Jasimine Simms S. Alford Highland, PharmD, BCPS Clinical Staff Pharmacist [email or web address] Alford Highland, [employer] 06/24/2022,6:54 AM

## 2022-06-24 NOTE — Progress Notes (Signed)
LOCATION: right RADIAL  DEFLATED PER PROTOCOL: YES  TIME BAND OFF/DRESSING APPLIED: 1315, gauze with tegaderm placed  SITE UPON ARRIVAL: LEVEL 0  SITE AFTER BAND REMOVAL: LEVEL 0  CIRCULATION SENSATION AND MOVEMENT: +2 radial pulse, + movement  COMMENTS:  rue remains elevated on pillow

## 2022-06-24 NOTE — Progress Notes (Signed)
Echocardiogram 2D Echocardiogram has been performed.  Oneal Deputy Renita Brocks RDCS 06/24/2022, 1:51 PM  Echo performed bedside, patient is still in cath holding bay 5

## 2022-06-24 NOTE — CV Procedure (Signed)
Totally occluded distal right coronary treated with 2.75 x 16 Synergy postdilated to 3.0 crossing over the continuation of the right coronary. Heavy left coronary calcification with less than 50% ostial proximal narrowing.  Left coronary system supplied collaterals to the PDA Overall normal LV function.  EF 55%.  EDP 22. Complicated by [name] reflex with profound hypotension requiring IV Levophed and atropine.

## 2022-06-24 NOTE — ED Provider Notes (Signed)
Patient transferred here from [facility] long ED as a STEMI.  Initially awaiting callback from cardiology.  She has received 324 mg of aspirin and heparin bolus.  The patient states that she is having pain on her left side of her chest and left arm.  She reports history of tobacco use and denies any other medical problems.  Heart is regular rate and rhythm, lungs are clear to auscultation bilaterally.  She is hemodynamically stable.  Cath Lab is ready for her and she will be transferred urgently.   Kemper Durie, DO 06/24/22 [phone]

## 2022-06-25 ENCOUNTER — Other Ambulatory Visit (HOSPITAL_COMMUNITY): Payer: Self-pay

## 2022-06-25 ENCOUNTER — Other Ambulatory Visit: Payer: Self-pay

## 2022-06-25 ENCOUNTER — Encounter (HOSPITAL_COMMUNITY): Payer: Self-pay | Admitting: Interventional Cardiology

## 2022-06-25 ENCOUNTER — Ambulatory Visit: Payer: BC Managed Care – PPO | Admitting: Family Medicine

## 2022-06-25 ENCOUNTER — Telehealth (HOSPITAL_COMMUNITY): Payer: Self-pay

## 2022-06-25 DIAGNOSIS — E785 Hyperlipidemia, unspecified: Secondary | ICD-10-CM | POA: Diagnosis not present

## 2022-06-25 DIAGNOSIS — R7303 Prediabetes: Secondary | ICD-10-CM | POA: Diagnosis not present

## 2022-06-25 DIAGNOSIS — I2111 ST elevation (STEMI) myocardial infarction involving right coronary artery: Secondary | ICD-10-CM | POA: Diagnosis not present

## 2022-06-25 DIAGNOSIS — Z955 Presence of coronary angioplasty implant and graft: Secondary | ICD-10-CM

## 2022-06-25 DIAGNOSIS — Z720 Tobacco use: Secondary | ICD-10-CM

## 2022-06-25 DIAGNOSIS — I25110 Atherosclerotic heart disease of native coronary artery with unstable angina pectoris: Secondary | ICD-10-CM

## 2022-06-25 LAB — MAGNESIUM: Magnesium: 2 mg/dL (ref 1.7–2.4)

## 2022-06-25 LAB — BASIC METABOLIC PANEL
Anion gap: 6 (ref 5–15)
BUN: 7 mg/dL (ref 6–20)
CO2: 25 mmol/L (ref 22–32)
Calcium: 9.1 mg/dL (ref 8.9–10.3)
Chloride: 108 mmol/L (ref 98–111)
Creatinine, Ser: 0.89 mg/dL (ref 0.44–1.00)
GFR, Estimated: >60 mL/min (ref >60)
Glucose, Bld: 94 mg/dL (ref 70–99)
Potassium: 3.6 mmol/L (ref 3.5–5.1)
Sodium: 139 mmol/L (ref 135–145)

## 2022-06-25 LAB — CBC
HCT: 33.9 % — ABNORMAL LOW (ref 36.0–46.0)
Hemoglobin: 11.2 g/dL — ABNORMAL LOW (ref 12.0–15.0)
MCH: 29.8 pg (ref 26.0–34.0)
MCHC: 33 g/dL (ref 30.0–36.0)
MCV: 90.2 fL (ref 80.0–100.0)
Platelets: 219 10*3/uL (ref 150–400)
RBC: 3.76 MIL/uL — ABNORMAL LOW (ref 3.87–5.11)
RDW: 14.5 % (ref 11.5–15.5)
WBC: 4 10*3/uL (ref 4.0–10.5)
nRBC: 0 % (ref 0.0–0.2)

## 2022-06-25 LAB — POCT ACTIVATED CLOTTING TIME
Activated Clotting Time: 215 seconds
Activated Clotting Time: 269 seconds
Activated Clotting Time: 359 seconds

## 2022-06-25 MED ORDER — POTASSIUM CHLORIDE CRYS ER 20 MEQ PO TBCR
40.0000 meq | EXTENDED_RELEASE_TABLET | Freq: Once | ORAL | Status: AC
  Administered 2022-06-25: 40 meq via ORAL
  Filled 2022-06-25: qty 2

## 2022-06-25 MED ORDER — METOPROLOL SUCCINATE ER 25 MG PO TB24
12.5000 mg | ORAL_TABLET | Freq: Every day | ORAL | Status: DC
  Administered 2022-06-25 – 2022-06-26 (×2): 12.5 mg via ORAL
  Filled 2022-06-25 (×2): qty 1

## 2022-06-25 MED ORDER — EZETIMIBE 10 MG PO TABS
10.0000 mg | ORAL_TABLET | Freq: Every day | ORAL | Status: DC
  Administered 2022-06-25 – 2022-06-26 (×2): 10 mg via ORAL
  Filled 2022-06-25 (×2): qty 1

## 2022-06-25 NOTE — Progress Notes (Addendum)
Rounding Note    Patient Name: [name] Date of Encounter: 06/25/2022  [place] Cardiologist: None -Dr. Tamala Julian or APP, followed by Dr. Ellyn Hack  Patient Profile     56 y.o. female with no prior medical history who presented the morning of 06/24/2022 with an inferior EKG changes concerning for possible STEMI-she was taken to the Cath Lab and found to have distal RCA occlusion treated with DES PCI restoring TIMI-3 flow. Initial EKG was actually borderline but somewhat suspicious of inferior injury pattern, however follow-up EKGs by 6:45 AM clearly showed elevations.  Assessment & Plan    Principal Problem:   Acute ST elevation myocardial infarction (STEMI) involving right coronary artery (HCC) Active Problems:   Hyperlipidemia with target LDL less than 70; > familial hyperlipidemia   Coronary artery disease involving native coronary artery of native heart with unstable angina pectoris (HCC)   Status post insertion of drug eluting coronary artery stent   Prediabetes   Tobacco use   Preeclampsia   Gestational diabetes  New diagnosis of CAD in the setting of Inferior STEMI/s/p RCA PCI On DAPT ASA/Brilinta X 1 year (although of bleeding or bruising concerns, could consider stopping aspirin sooner) High-dose statin We will start low-dose beta-blocker (has significant ectopy and atrial runs on telemetry) -> Toprol 12.5 mg twice daily, titrate up and consolidate on outpatient. Smoking cessation counseling Significant hyperlipidemia with LDL of 184, suspect that she will probably need PCSK9 inhibitor.   We will start high-dose statin and add Zetia for now, but will need lipid clinic evaluation with outpatient lipid panel checked at her initial follow-up, in order to initiate paperwork for PCSK9 versus inclisiran A1c 6.1 -> to consider SGLT2 inhibitor Smoking cessation counseling provided.  She says that she is going to stop smoking.  She was already cutting back.  Dispo:  Ambulated in the hallway today with RN and cardiac rehab, if stable, transfer to telemetry with plans for potential discharge as early as tomorrow.  =================================================================== Subjective   Feels fine today.  No further discomfort.  Was actually able to ambulate in the hallway yesterday and again this morning.  Her anginal equivalent pain that she describes was jaw/tooth pain on the left side as well as left shoulder pain and a sense of unease and not able to rest.  I asked Patrick Jupiter to her that this would be her "anginal equivalent "  Inpatient Medications    Scheduled Meds:  aspirin  81 mg Oral Daily   atorvastatin  80 mg Oral Daily   Chlorhexidine Gluconate Cloth  6 each Topical Daily   enoxaparin (LOVENOX) injection  40 mg Subcutaneous Q24H   melatonin  3 mg Oral QHS   sodium chloride flush  3 mL Intravenous Q12H   ticagrelor  90 mg Oral BID   Continuous Infusions:  sodium chloride     sodium chloride     norepinephrine (LEVOPHED) Adult infusion     PRN Meds: sodium chloride, acetaminophen, alum & mag hydroxide-simeth, ondansetron (ZOFRAN) IV, mouth rinse, oxyCODONE, sodium chloride flush   Vital Signs    Vitals:   06/25/22 0800 06/25/22 0815 06/25/22 0830 06/25/22 0845  BP: 112/71     Pulse: 69 72 82 80  Resp: 18     Temp:      TempSrc:      SpO2: 99% 99% 100% 100%  Weight:      Height:        Intake/Output Summary (Last 24 hours) at 06/25/2022  [place] filed at 06/24/2022 2135 Gross per 24 hour  Intake 2303.1 ml  Output 1095 ml  Net 1208.1 ml      06/24/2022    6:21 AM 07/29/2020   10:08 AM 06/07/2020   10:17 AM  Last 3 Weights  Weight (lbs) 144 lb 149 lb 145 lb  Weight (kg) 65.318 kg 67.586 kg 65.772 kg      Telemetry    Mostly sinus rhythm with short burst of sinus arrhythmia and atrial runs   - Personally Reviewed  ECG    Sinus rhythm-65 bpm sinus arrhythmia.  T wave inversions in V3 as well as II, III,  avL, exclude inferolateral ischemia.  Findings most likely consistent with evolutionary changes of inferior MI.- Personally Reviewed  Cardiac Studies   Cardiac cath-PCI 06/24/2022: RIGHT DOMINANT.  CULPRIT LESION-distal RCA 100% (left to right collaterals) => DES PCI (Synergy XD DES 2.75 x 16-3.0 mm postdilation), reduced to 0%.  60% proximal RCA.  Diffuse 35% proximal LAD.  Normal LCx. Diagnostic Dominance: Right   Intervention       TTE 06/24/2022: EF 55 to 60%.  No RWMA.  Normal RV and RVP.  Normal aortic and mitral valves.  Normal RAP.  Physical Exam   GEN: Resting comfortably in bed no acute distress.  Healthy-appearing.  Well-groomed. Neck: No JVD or bruit Cardiac: RRR with some ectopy,, no murmurs, rubs, or gallops.  Respiratory: Clear to auscultation bilaterally.  Nonlabored, good air movement. GI: Soft, nontender, non-distended.  NABS. MS: No edema; No deformity.  Radial cath site intact.  No bruising. Neuro:  Nonfocal  Psych: Normal affect   Labs    High Sensitivity Troponin:   Recent Labs  Lab 06/24/22 0643 06/24/22 0652 06/24/22 2011  TROPONINIHS 581* 521* 7,337*     Chemistry Recent Labs  Lab 06/24/22 0643 06/24/22 0652 06/24/22 0738 06/24/22 1808 06/25/22 0236  NA 139 138 141  --  139  K 3.6 3.5 3.5  --  3.6  CL 107 106 104  --  108  CO2 23 24  --   --  25  GLUCOSE 113* 105* 103*  --  94  BUN '15 14 11  '$ --  7  CREATININE 0.85 0.72 0.70 0.87 0.89  CALCIUM 9.3 9.0  --   --  9.1  MG  --   --   --   --  2.0  PROT  --  7.6  --   --   --   ALBUMIN  --  3.7  --   --   --   AST  --  20  --   --   --   ALT  --  13  --   --   --   ALKPHOS  --  62  --   --   --   BILITOT  --  0.5  --   --   --   GFRNONAA >60 >60  --  >60 >60  ANIONGAP 9 8  --   --  6    Lipids  Recent Labs  Lab 06/24/22 0652  CHOL 257*  TRIG 57  HDL 62  LDLCALC 184*  CHOLHDL 4.1    Hematology Recent Labs  Lab 06/24/22 0643 06/24/22 0738 06/24/22 1808 06/25/22 0236  WBC 3.5*   --  4.3 4.0  RBC 4.33  --  3.92 3.76*  HGB 12.8 11.9* 11.7* 11.2*  HCT 39.1 35.0* 35.0* 33.9*  MCV 90.3  --  89.3 90.2  MCH 29.6  --  29.8 29.8  MCHC 32.7  --  33.4 33.0  RDW 14.3  --  14.5 14.5  PLT 249  --  224 219   Thyroid No results for input(s): "TSH", "FREET4" in the last 168 hours.  BNPNo results for input(s): "BNP", "PROBNP" in the last 168 hours.  DDimer No results for input(s): "DDIMER" in the last 168 hours.   Lab Results  Component Value Date   HGBA1C 6.1 (H) 06/24/2022    Radiology    DG Chest Port 1 View  Result Date: 06/24/2022 CLINICAL DATA:  Chest pain. EXAM: PORTABLE CHEST 1 VIEW COMPARISON:  Chest x-ray February 09, 2004 FINDINGS: The heart size and mediastinal contours are within normal limits. Both lungs are clear. No visible pleural effusions or pneumothorax. No acute osseous abnormality. IMPRESSION: No active disease. Electronically Signed   By: Margaretha Sheffield M.D.   On: 06/24/2022 17:38      For questions or updates, please contact [place] Please consult [email or web address] for contact info under        Signed, Glenetta Hew, MD  06/25/2022, 9:05 AM

## 2022-06-25 NOTE — Discharge Instructions (Signed)

## 2022-06-25 NOTE — Progress Notes (Signed)
CARDIAC REHAB PHASE I   PRE:  Rate/Rhythm: 70 SR  BP:  Supine: 99/60   SaO2: 100 RA  MODE:  Ambulation: 740 ft   POST:  Rate/Rhythm: 75 SR  BP:  Sitting: 113/67   SaO2: 100 RA  Pt received in bed with husband and sister present, agreeable to ambulation and education. Pt ambulated in hallway independently and tolerated well with no s/s. Pt and family educated on risk factors, exercise guidelines, angina/NTG, stent, MI Booklet, restrictions, site care, nutrition, smoking cessation, and orientation to CRP2 referral to [facility]. Pt states she is ready to quit smoking and interested in CRP2. All questions from pt and family answered, pt left in bed with call bell in reach.  [place], [place] 06/25/2022 2:18 PM

## 2022-06-25 NOTE — Progress Notes (Signed)
Report called to receiving rn 6E29. Patient with no complaints at the current time. Will transfer via [place].

## 2022-06-25 NOTE — TOC Benefit Eligibility Note (Addendum)
Patient Teacher, English as a foreign language completed.    The patient is currently admitted and upon discharge could be taking Brilinta '90mg'$ .  The current 30 day co-pay is $5.00.   The patient is insured through [place], [place] Patient Advocate Specialist [place] Patient Advocate Team Direct Number: [phone] Fax: [phone]

## 2022-06-25 NOTE — Plan of Care (Signed)
  Problem: Education: Goal: Knowledge of General Education information will improve Description: Including pain rating scale, medication(s)/side effects and non-pharmacologic comfort measures 06/25/2022 0110 by Henreitta Leber, RN Outcome: Progressing 06/25/2022 0108 by Henreitta Leber, RN Outcome: Progressing   Problem: Health Behavior/Discharge Planning: Goal: Ability to manage health-related needs will improve 06/25/2022 0110 by Henreitta Leber, RN Outcome: Progressing 06/25/2022 0108 by Henreitta Leber, RN Outcome: Progressing   Problem: Clinical Measurements: Goal: Ability to maintain clinical measurements within normal limits will improve 06/25/2022 0110 by Henreitta Leber, RN Outcome: Progressing 06/25/2022 0108 by Henreitta Leber, RN Outcome: Progressing Goal: Will remain free from infection 06/25/2022 0110 by Henreitta Leber, RN Outcome: Progressing 06/25/2022 0108 by Henreitta Leber, RN Outcome: Progressing Goal: Diagnostic test results will improve 06/25/2022 0110 by Henreitta Leber, RN Outcome: Progressing 06/25/2022 0108 by Henreitta Leber, RN Outcome: Progressing Goal: Respiratory complications will improve 06/25/2022 0110 by Henreitta Leber, RN Outcome: Progressing 06/25/2022 0108 by Henreitta Leber, RN Outcome: Progressing Goal: Cardiovascular complication will be avoided 06/25/2022 0110 by Henreitta Leber, RN Outcome: Progressing 06/25/2022 0108 by Henreitta Leber, RN Outcome: Progressing   Problem: Activity: Goal: Risk for activity intolerance will decrease 06/25/2022 0110 by Henreitta Leber, RN Outcome: Progressing 06/25/2022 0108 by Henreitta Leber, RN Outcome: Progressing   Problem: Nutrition: Goal: Adequate nutrition will be maintained 06/25/2022 0110 by Henreitta Leber, RN Outcome: Progressing 06/25/2022 0108 by Henreitta Leber, RN Outcome: Progressing   Problem: Coping: Goal: Level  of anxiety will decrease 06/25/2022 0110 by Henreitta Leber, RN Outcome: Progressing 06/25/2022 0108 by Henreitta Leber, RN Outcome: Progressing   Problem: Elimination: Goal: Will not experience complications related to bowel motility 06/25/2022 0110 by Henreitta Leber, RN Outcome: Progressing 06/25/2022 0108 by Henreitta Leber, RN Outcome: Progressing Goal: Will not experience complications related to urinary retention 06/25/2022 0110 by Henreitta Leber, RN Outcome: Progressing 06/25/2022 0108 by Henreitta Leber, RN Outcome: Progressing   Problem: Pain Managment: Goal: General experience of comfort will improve 06/25/2022 0110 by Henreitta Leber, RN Outcome: Progressing 06/25/2022 0108 by Henreitta Leber, RN Outcome: Progressing   Problem: Safety: Goal: Ability to remain free from injury will improve 06/25/2022 0110 by Henreitta Leber, RN Outcome: Progressing 06/25/2022 0108 by Henreitta Leber, RN Outcome: Progressing   Problem: Skin Integrity: Goal: Risk for impaired skin integrity will decrease 06/25/2022 0110 by Henreitta Leber, RN Outcome: Progressing 06/25/2022 0108 by Henreitta Leber, RN Outcome: Progressing   Problem: Education: Goal: Understanding of cardiac disease, CV risk reduction, and recovery process will improve Outcome: Progressing Goal: Understanding of medication regimen will improve Outcome: Progressing Goal: Individualized Educational Video(s) Outcome: Progressing   Problem: Activity: Goal: Ability to tolerate increased activity will improve Outcome: Progressing   Problem: Cardiac: Goal: Ability to achieve and maintain adequate cardiopulmonary perfusion will improve Outcome: Progressing Goal: Vascular access site(s) Level 0-1 will be maintained Outcome: Progressing   Problem: Health Behavior/Discharge Planning: Goal: Ability to safely manage health-related needs after discharge will improve Outcome:  Progressing

## 2022-06-25 NOTE — Telephone Encounter (Signed)
Pharmacy Patient Advocate Encounter  Insurance verification completed.    The patient is insured through [employer]   The patient is currently admitted and ran test claims for the following: Brilinta '90mg'$ .  Copays and coinsurance results were relayed to Inpatient clinical team.

## 2022-06-25 NOTE — Progress Notes (Signed)
Received via w/c from [place]. A & O x 4. Oriented to room and unit. Call bell in reach. Denies pain, SOB or other c/o. Brother into room at bedside. No further needs expressed at this time.

## 2022-06-25 NOTE — Plan of Care (Signed)

## 2022-06-26 ENCOUNTER — Other Ambulatory Visit (HOSPITAL_COMMUNITY): Payer: Self-pay

## 2022-06-26 DIAGNOSIS — R7303 Prediabetes: Secondary | ICD-10-CM | POA: Diagnosis not present

## 2022-06-26 DIAGNOSIS — I25110 Atherosclerotic heart disease of native coronary artery with unstable angina pectoris: Secondary | ICD-10-CM | POA: Diagnosis not present

## 2022-06-26 DIAGNOSIS — E785 Hyperlipidemia, unspecified: Secondary | ICD-10-CM | POA: Diagnosis not present

## 2022-06-26 DIAGNOSIS — I2111 ST elevation (STEMI) myocardial infarction involving right coronary artery: Secondary | ICD-10-CM | POA: Diagnosis not present

## 2022-06-26 LAB — LIPOPROTEIN A (LPA): Lipoprotein (a): 82.6 nmol/L — ABNORMAL HIGH (ref <75.0)

## 2022-06-26 LAB — CBC
HCT: 32.8 % — ABNORMAL LOW (ref 36.0–46.0)
Hemoglobin: 11.2 g/dL — ABNORMAL LOW (ref 12.0–15.0)
MCH: 30.4 pg (ref 26.0–34.0)
MCHC: 34.1 g/dL (ref 30.0–36.0)
MCV: 89.1 fL (ref 80.0–100.0)
Platelets: 216 10*3/uL (ref 150–400)
RBC: 3.68 MIL/uL — ABNORMAL LOW (ref 3.87–5.11)
RDW: 14.3 % (ref 11.5–15.5)
WBC: 3.5 10*3/uL — ABNORMAL LOW (ref 4.0–10.5)
nRBC: 0 % (ref 0.0–0.2)

## 2022-06-26 MED ORDER — EZETIMIBE 10 MG PO TABS
10.0000 mg | ORAL_TABLET | Freq: Every day | ORAL | 3 refills | Status: DC
  Filled 2022-06-26: qty 30, 30d supply, fill #0

## 2022-06-26 MED ORDER — TICAGRELOR 90 MG PO TABS
90.0000 mg | ORAL_TABLET | Freq: Two times a day (BID) | ORAL | 11 refills | Status: DC
  Filled 2022-06-26: qty 60, 30d supply, fill #0

## 2022-06-26 MED ORDER — METOPROLOL SUCCINATE ER 25 MG PO TB24
12.5000 mg | ORAL_TABLET | Freq: Every day | ORAL | 11 refills | Status: DC
  Filled 2022-06-26: qty 15, 30d supply, fill #0

## 2022-06-26 MED ORDER — NITROGLYCERIN 0.4 MG SL SUBL
0.4000 mg | SUBLINGUAL_TABLET | SUBLINGUAL | 2 refills | Status: DC | PRN
  Filled 2022-06-26: qty 25, 7d supply, fill #0

## 2022-06-26 MED ORDER — ASPIRIN 81 MG PO TBEC
81.0000 mg | DELAYED_RELEASE_TABLET | Freq: Every day | ORAL | 3 refills | Status: AC
  Filled 2022-06-26: qty 30, 30d supply, fill #0

## 2022-06-26 MED ORDER — ATORVASTATIN CALCIUM 80 MG PO TABS
80.0000 mg | ORAL_TABLET | Freq: Every day | ORAL | 3 refills | Status: DC
  Filled 2022-06-26: qty 30, 30d supply, fill #0

## 2022-06-26 NOTE — Discharge Summary (Signed)
Discharge Summary    Patient ID: [name] MRN: [id number]; DOB: [birth year]  Admit date: 06/24/2022 Discharge date: 06/26/2022  PCP:  Darreld Mclean, MD   [place] [facility] Cardiologist:  Sinclair Grooms, MD     Discharge Diagnoses    Principal Problem:   Acute ST elevation myocardial infarction (STEMI) involving right coronary artery [facility]) Active Problems:   Preeclampsia   Gestational diabetes   Prediabetes   Tobacco use   Coronary artery disease involving native coronary artery of native heart with unstable angina pectoris [facility])   Status post insertion of drug eluting coronary artery stent   Hyperlipidemia with target LDL less than 70; > familial hyperlipidemia   Diagnostic Studies/Procedures    Cath: 06/24/22  CONCLUSIONS: Inferior STEMI treated with 16 x 2.75 Synergy postdilated to 3.0 mm proximally and mid vessel, converting TIMI grade 0 flow to TIMI-3 flow and improving 100% stenosis to 0%. Patent left main Moderate diffuse disease in LAD with up to 35% in the ostial to proximal segment. Mild diffuse disease in circumflex. Right coronary is dominant.  PDA collaterals are noted from both the circumflex and LAD to the PDA.  The proximal to mid segment contains 55 to 65% stenosis. Overall LV function is normal without significant regional wall motion abnormality.  LVEDP 21 mmHg suggesting acute diastolic heart failure due to ischemia.   RECOMMENDATIONS: Preventive therapy with high intensity statin therapy and the addition of an ACE or ARB in this patient with prediabetes. Dual antiplatelet therapy with aspirin and Brilinta for at least 3 months, okay to switch to aspirin and Plavix starting at 3 months.  After 12 months of dual antiplatelet therapy drop aspirin and continue clopidogrel monotherapy. The patient needs aggressive lipid-lowering to LDL target less than 55.  May require PCSK9.  May be a candidate for ongoing trial with  Leqvio.  Diagnostic Dominance: Right  Intervention   Echo: 06/24/22  IMPRESSIONS     1. Left ventricular ejection fraction, by estimation, is 55 to 60%. The  left ventricle has normal function. The left ventricle has no regional  wall motion abnormalities. Left ventricular diastolic parameters were  normal.   2. Right ventricular systolic function is normal. The right ventricular  size is normal. There is normal pulmonary artery systolic pressure.   3. The mitral valve is normal in structure. Trivial mitral valve  regurgitation. No evidence of mitral stenosis.   4. The aortic valve is normal in structure. Aortic valve regurgitation is  not visualized. No aortic stenosis is present.   5. The inferior vena cava is normal in size with greater than 50%  respiratory variability, suggesting right atrial pressure of 3 mmHg.   FINDINGS   Left Ventricle: Left ventricular ejection fraction, by estimation, is 55  to 60%. The left ventricle has normal function. The left ventricle has no  regional wall motion abnormalities. The left ventricular internal cavity  size was normal in size. There is   no left ventricular hypertrophy. Left ventricular diastolic parameters  were normal.   Right Ventricle: The right ventricular size is normal. No increase in  right ventricular wall thickness. Right ventricular systolic function is  normal. There is normal pulmonary artery systolic pressure. The tricuspid  regurgitant velocity is 2.35 m/s, and   with an assumed right atrial pressure of 3 mmHg, the estimated right  ventricular systolic pressure is 68.1 mmHg.   Left Atrium: Left atrial size was normal in size.   Right  Atrium: Right atrial size was normal in size.   Pericardium: There is no evidence of pericardial effusion.   Mitral Valve: The mitral valve is normal in structure. Trivial mitral  valve regurgitation. No evidence of mitral valve stenosis.   Tricuspid Valve: The tricuspid valve  is normal in structure. Tricuspid  valve regurgitation is trivial. No evidence of tricuspid stenosis.   Aortic Valve: The aortic valve is normal in structure. Aortic valve  regurgitation is not visualized. No aortic stenosis is present.   Pulmonic Valve: The pulmonic valve was normal in structure. Pulmonic valve  regurgitation is not visualized. No evidence of pulmonic stenosis.   Aorta: The aortic root is normal in size and structure.   Venous: The inferior vena cava is normal in size with greater than 50%  respiratory variability, suggesting right atrial pressure of 3 mmHg.   IAS/Shunts: No atrial level shunt detected by color flow Doppler.  _____________   History of Present Illness     [name] is a 56 y.o. female with no prior medical hx who presented with inferior STEMI. 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  The discomfort became very severe and protracted after 4 AM the morning of admission.  Her husband drove her to the emergency room where initial EKGs were suspicious for ongoing inferior ischemia but not diagnostic of STEMI.  The pain worsened and she had diagnostic inferior ST elevation leading to STEMI activation at around 6:45 AM.   No prior history of heart disease.  She does smoke intermittently, had pregnancy related gestational diabetes and preeclampsia, hyperlipidemia untreated, and prediabetes (A1c 6.3 2021).  Hospital Course     Inferior STEMI CAD s/p RCA PCI -- underwent emergent cardiac cath 10/18 with PCI/DES to RCA. Placed on DAPT ASA/Brilinta X 1 year (although of bleeding or bruising concerns, could consider stopping aspirin sooner), long with, high dose statin and low BB. Seen by cardiac rehab. No recurrent chest pain.   Hyperlipidemia -- LDL 184, started on high dose statin along with Zetia -- suspect that she will probably need PCSK9  inhibitor, referred to Lipid clinic   PreDM -- Hgb A1c 6.1 -- diet discussed  Tobacco use -- cessation advised   General: Well developed, well nourished, female appearing in no acute distress. Head: Normocephalic, atraumatic.  Neck: Supple without bruits, JVD. Lungs:  Resp regular and unlabored, CTA. Heart: RRR, S1, S2, no S3, S4, or murmur; no rub. Abdomen: Soft, non-tender, non-distended with normoactive bowel sounds. No hepatomegaly. No rebound/guarding. No obvious abdominal masses. Extremities: No clubbing, cyanosis, edema. Distal pedal pulses are 2+ bilaterally. Right radial cath site stable without bruising or hematoma Neuro: Alert and oriented X 3. Moves all extremities spontaneously. Psych: Normal affect.  Patient seen by Dr. Ellyn Hack and deemed stable for discharge home. Follow up arranged in the office. Medications sent to the [facility] pharmacy. Educated by pharmD.   Did the patient have an acute coronary syndrome (MI, NSTEMI, STEMI, etc) this admission?:  Yes                               AHA/ACC Clinical Performance & Quality Measures: Aspirin prescribed? - Yes ADP Receptor Inhibitor (Plavix/Clopidogrel, Brilinta/Ticagrelor or Effient/Prasugrel) prescribed (includes medically managed patients)? - Yes Beta Blocker prescribed? - Yes High Intensity Statin (Lipitor 40-64m or Crestor  20-27m) prescribed? - Yes EF assessed during THIS hospitalization? - Yes For EF <40%, was ACEI/ARB prescribed? - Not Applicable (EF >/= 416% For EF <40%, Aldosterone Antagonist (Spironolactone or Eplerenone) prescribed? - Not Applicable (EF >/= 410% Cardiac Rehab Phase II ordered (including medically managed patients)? - Yes   The patient will be scheduled for a TOC follow up appointment in 10-14 days.  A message [facility] the [facility] at the office where the patient should be seen for follow up.  _____________  Discharge Vitals Blood pressure 92/62, pulse 77,  temperature 97.7 F (36.5 C), temperature source Oral, resp. rate 18, height '5\' 6"'  (1.676 m), weight 65.3 kg, last menstrual period 03/01/2016, SpO2 100 %.  Filed Weights   06/24/22 0621  Weight: 65.3 kg    Labs & Radiologic Studies    CBC Recent Labs    06/25/22 0236 06/26/22 0204  WBC 4.0 3.5*  HGB 11.2* 11.2*  HCT 33.9* 32.8*  MCV 90.2 89.1  PLT 219 2960  Basic Metabolic Panel Recent Labs    06/24/22 0652 06/24/22 0738 06/24/22 1808 06/25/22 0236  NA 138 141  --  139  K 3.5 3.5  --  3.6  CL 106 104  --  108  CO2 24  --   --  25  GLUCOSE 105* 103*  --  94  BUN 14 11  --  7  CREATININE 0.72 0.70 0.87 0.89  CALCIUM 9.0  --   --  9.1  MG  --   --   --  2.0   Liver Function Tests Recent Labs    06/24/22 0652  AST 20  ALT 13  ALKPHOS 62  BILITOT 0.5  PROT 7.6  ALBUMIN 3.7   No results for input(s): "LIPASE", "AMYLASE" in the last 72 hours. High Sensitivity Troponin:   Recent Labs  Lab 06/24/22 0643 06/24/22 0652 06/24/22 2011  TROPONINIHS 581* 521* 7,337*    BNP Invalid input(s): "POCBNP" D-Dimer No results for input(s): "DDIMER" in the last 72 hours. Hemoglobin A1C Recent Labs    06/24/22 0652  HGBA1C 6.1*   Fasting Lipid Panel Recent Labs    06/24/22 0652  CHOL 257*  HDL 62  LDLCALC 184*  TRIG 57  CHOLHDL 4.1   Thyroid Function Tests No results for input(s): "TSH", "T4TOTAL", "T3FREE", "THYROIDAB" in the last 72 hours.  Invalid input(s): "FREET3" _____________  DG Chest Port 1 View  Result Date: 06/24/2022 CLINICAL DATA:  Chest pain. EXAM: PORTABLE CHEST 1 VIEW COMPARISON:  Chest x-ray February 09, 2004 FINDINGS: The heart size and mediastinal contours are within normal limits. Both lungs are clear. No visible pleural effusions or pneumothorax. No acute osseous abnormality. IMPRESSION: No active disease. Electronically Signed   By: FMargaretha SheffieldM.D.   On: 06/24/2022 17:38   ECHOCARDIOGRAM COMPLETE  Result Date: 06/24/2022     ECHOCARDIOGRAM REPORT   Patient Name:   Cynthia TEFFETELLERDate of Exam: 06/24/2022 Medical Rec #:  [id number]   Height:       66.0 in Accession #:    [id number]  Weight:       144.0 lb Date of Birth:  [birth year]  BSA:          1.739 m Patient Age:    89years     BP:           103/75 mmHg Patient Gender: F  HR:           62 bpm. Exam Location:  Inpatient Procedure: 2D Echo, Color Doppler and Cardiac Doppler Indications:    Acute MI i21.9  History:        Patient has no prior history of Echocardiogram examinations.  Sonographer:    Raquel Sarna Senior RDCS Referring Phys: [place]  1. Left ventricular ejection fraction, by estimation, is 55 to 60%. The left ventricle has normal function. The left ventricle has no regional wall motion abnormalities. Left ventricular diastolic parameters were normal.  2. Right ventricular systolic function is normal. The right ventricular size is normal. There is normal pulmonary artery systolic pressure.  3. The mitral valve is normal in structure. Trivial mitral valve regurgitation. No evidence of mitral stenosis.  4. The aortic valve is normal in structure. Aortic valve regurgitation is not visualized. No aortic stenosis is present.  5. The inferior vena cava is normal in size with greater than 50% respiratory variability, suggesting right atrial pressure of 3 mmHg. FINDINGS  Left Ventricle: Left ventricular ejection fraction, by estimation, is 55 to 60%. The left ventricle has normal function. The left ventricle has no regional wall motion abnormalities. The left ventricular internal cavity size was normal in size. There is  no left ventricular hypertrophy. Left ventricular diastolic parameters were normal. Right Ventricle: The right ventricular size is normal. No increase in right ventricular wall thickness. Right ventricular systolic function is normal. There is normal pulmonary artery systolic pressure. The tricuspid regurgitant velocity is 2.35 m/s, and   with an assumed right atrial pressure of 3 mmHg, the estimated right ventricular systolic pressure is 32.2 mmHg. Left Atrium: Left atrial size was normal in size. Right Atrium: Right atrial size was normal in size. Pericardium: There is no evidence of pericardial effusion. Mitral Valve: The mitral valve is normal in structure. Trivial mitral valve regurgitation. No evidence of mitral valve stenosis. Tricuspid Valve: The tricuspid valve is normal in structure. Tricuspid valve regurgitation is trivial. No evidence of tricuspid stenosis. Aortic Valve: The aortic valve is normal in structure. Aortic valve regurgitation is not visualized. No aortic stenosis is present. Pulmonic Valve: The pulmonic valve was normal in structure. Pulmonic valve regurgitation is not visualized. No evidence of pulmonic stenosis. Aorta: The aortic root is normal in size and structure. Venous: The inferior vena cava is normal in size with greater than 50% respiratory variability, suggesting right atrial pressure of 3 mmHg. IAS/Shunts: No atrial level shunt detected by color flow Doppler.  LEFT VENTRICLE PLAX 2D LVIDd:         4.00 cm   Diastology LVIDs:         2.60 cm   LV e' medial:    11.50 cm/s LV PW:         0.90 cm   LV E/e' medial:  7.9 LV IVS:        0.70 cm   LV e' lateral:   12.80 cm/s LVOT diam:     1.80 cm   LV E/e' lateral: 7.1 LV SV:         48 LV SV Index:   28 LVOT Area:     2.54 cm  RIGHT VENTRICLE RV S prime:     11.50 cm/s TAPSE (M-mode): 2.2 cm LEFT ATRIUM           Index        RIGHT ATRIUM           Index  LA diam:      2.80 cm 1.61 cm/m   RA Area:     11.50 cm LA Vol (A2C): 37.7 ml 21.68 ml/m  RA Volume:   25.00 ml  14.37 ml/m LA Vol (A4C): 32.3 ml 18.57 ml/m  AORTIC VALVE LVOT Vmax:   95.80 cm/s LVOT Vmean:  66.700 cm/s LVOT VTI:    0.190 m  AORTA Ao Root diam: 3.10 cm MITRAL VALVE               TRICUSPID VALVE MV Area (PHT): 3.97 cm    TR Peak grad:   22.1 mmHg MV Decel Time: 191 msec    TR Vmax:        235.00  cm/s MV E velocity: 90.70 cm/s MV A velocity: 48.70 cm/s  SHUNTS MV E/A ratio:  1.86        Systemic VTI:  0.19 m                            Systemic Diam: 1.80 cm Glori Bickers MD Electronically signed by Glori Bickers MD Signature Date/Time: 06/24/2022/4:08:10 PM    Final    CARDIAC CATHETERIZATION  Result Date: 06/24/2022   Non-stenotic RPDA lesion. CONCLUSIONS: Inferior STEMI treated with 16 x 2.75 Synergy postdilated to 3.0 mm proximally and mid vessel, converting TIMI grade 0 flow to TIMI-3 flow and improving 100% stenosis to 0%. Patent left main Moderate diffuse disease in LAD with up to 35% in the ostial to proximal segment. Mild diffuse disease in circumflex. Right coronary is dominant.  PDA collaterals are noted from both the circumflex and LAD to the PDA.  The proximal to mid segment contains 55 to 65% stenosis. Overall LV function is normal without significant regional wall motion abnormality.  LVEDP 21 mmHg suggesting acute diastolic heart failure due to ischemia. RECOMMENDATIONS: Preventive therapy with high intensity statin therapy and the addition of an ACE or ARB in this patient with prediabetes. Dual antiplatelet therapy with aspirin and Brilinta for at least 3 months, okay to switch to aspirin and Plavix starting at 3 months.  After 12 months of dual antiplatelet therapy drop aspirin and continue clopidogrel monotherapy. The patient needs aggressive lipid-lowering to LDL target less than 55.  May require PCSK9.  May be a candidate for ongoing trial with Leqvio.   Disposition   Pt is being discharged home today in good condition.  Follow-up Plans & Appointments     Follow-up Information     Barbarann Ehlers Junius Creamer., NP Follow up on 07/03/2022.   Specialty: Nurse Practitioner Why: at 10am for your follow up appt with Dr. Darliss Ridgel' NP Theotis Barrio information: [address] [place] [address] [phone]                Discharge Instructions      AMB Referral to Advanced Lipid Disorders Clinic   Complete by: As directed    Internal Lipid Clinic Referral Scheduling  Internal lipid clinic referrals are providers within [facility], who wish to refer established patients for routine management (help in starting PCSK9 inhibitor therapy) or advanced therapies.  Internal MD referral criteria:              1. All patients with LDL>190 mg/dL  2. All patients with Triglycerides >500 mg/dL  3. Patients with suspected or confirmed heterozygous familial hyperlipidemia (HeFH) or homozygous familial hyperlipidemia (HoFH)  4. Patients with family history of suspicious  for genetic dyslipidemia desiring genetic testing  5. Patients refractory to standard guideline based therapy  6. Patients with statin intolerance (failed 2 statins, one of which must be a high potency statin)  7. Patients who the provider desires to be seen by MD   Internal PharmD referral criteria:   1. Follow-up patients for medication management  2. Follow-up for compliance monitoring  3. Patients for drug education  4. Patients with statin intolerance  5. PCSK9 inhibitor education and prior authorization approvals  6. Patients with triglycerides <500 mg/dL  External Lipid Clinic Referral  External lipid clinic referrals are for providers outside of [facility], considered new clinic patients - automatically routed to MD schedule   Amb Referral to Cardiac Rehabilitation   Complete by: As directed    Diagnosis:  Coronary Stents STEMI     After initial evaluation and assessments completed: Virtual Based Care may be provided alone or in conjunction with Phase 2 Cardiac Rehab based on patient barriers.: Yes   Intensive Cardiac Rehabilitation (ICR) [place] location only OR Traditional Cardiac Rehabilitation (TCR) *If criteria for ICR are not met will enroll in TCR [facility] only): Yes   Call MD for:  difficulty breathing, headache or visual disturbances   Complete by: As directed     Call MD for:  persistant dizziness or light-headedness   Complete by: As directed    Call MD for:  redness, tenderness, or signs of infection (pain, swelling, redness, odor or green/yellow discharge around incision site)   Complete by: As directed    Diet - low sodium heart healthy   Complete by: As directed    Discharge instructions   Complete by: As directed    Radial Site Care Refer to this sheet in the next few weeks. These instructions provide you with information on caring for yourself after your procedure. Your caregiver may also give you more specific instructions. Your treatment has been planned according to current medical practices, but problems sometimes occur. Call your caregiver if you have any problems or questions after your procedure. HOME CARE INSTRUCTIONS You may shower the day after the procedure. Remove the bandage (dressing) and gently wash the site with plain soap and water. Gently pat the site dry.  Do not apply powder or lotion to the site.  Do not submerge the affected site in water for 3 to 5 days.  Inspect the site at least twice daily.  Do not flex or bend the affected arm for 24 hours.  No lifting over 5 pounds (2.3 kg) for 5 days after your procedure.  Do not drive home if you are discharged the same day of the procedure. Have someone else drive you.  You may drive 24 hours after the procedure unless otherwise instructed by your caregiver.  What to expect: Any bruising will usually fade within 1 to 2 weeks.  Blood that collects in the tissue (hematoma) may be painful to the touch. It should usually decrease in size and tenderness within 1 to 2 weeks.  SEEK IMMEDIATE MEDICAL CARE IF: You have unusual pain at the radial site.  You have redness, warmth, swelling, or pain at the radial site.  You have drainage (other than a small amount of blood on the dressing).  You have chills.  You have a fever or persistent symptoms for more than 72 hours.  You have a  fever and your symptoms suddenly get worse.  Your arm becomes pale, cool, tingly, or numb.  You have heavy  bleeding from the site. Hold pressure on the site.   PLEASE DO NOT MISS ANY DOSES OF YOUR BRILINTA!!!!! Also keep a log of you blood pressures and bring back to your follow up appt. Please call the office with any questions.   Patients taking blood thinners should generally stay away from medicines like ibuprofen, Advil, Motrin, naproxen, and Aleve due to risk of stomach bleeding. You may take Tylenol as directed or talk to your primary doctor about alternatives.   PLEASE ENSURE THAT YOU DO NOT RUN OUT OF YOUR BRILINTA. This medication is very important to remain on for at least one year. IF you have issues obtaining this medication due to cost please CALL the office 3-5 business days prior to running out in order to prevent missing doses of this medication.   Increase activity slowly   Complete by: As directed         Discharge Medications   Allergies as of 06/26/2022   No Known Allergies      Medication List     STOP taking these medications    BC HEADACHE POWDER PO       TAKE these medications    Aspirin Low Dose 81 MG tablet Generic drug: aspirin EC Take 1 tablet (81 mg total) by mouth daily. Swallow whole.   atorvastatin 80 MG tablet Commonly known as: LIPITOR Take 1 tablet (80 mg total) by mouth daily. Start taking on: June 27, 2022   Brilinta 90 MG Tabs tablet Generic drug: ticagrelor Take 1 tablet (90 mg total) by mouth 2 (two) times daily.   ezetimibe 10 MG tablet Commonly known as: ZETIA Take 1 tablet (10 mg total) by mouth daily. Start taking on: June 27, 2022   metoprolol succinate 25 MG 24 hr tablet Commonly known as: TOPROL-XL Take 0.5 tablets (12.5 mg total) by mouth daily. Start taking on: June 27, 2022   One-A-Day for Her Suzzanne Cloud 1 tablet by mouth daily.   Theraflu Flu & Sore Throat 20-10-650 MG Pack Generic  drug: Pheniramine-PE-APAP Take 1 packet by mouth every 6 (six) hours as needed (for cold-like symptoms, mixed into tea).           Outstanding Labs/Studies   FLP/LFTs in 8 weeks   Duration of Discharge Encounter   Greater than 30 minutes including physician time.  Signed, Reino Bellis, NP 06/26/2022, 11:11 AM

## 2022-06-30 ENCOUNTER — Encounter: Payer: Self-pay | Admitting: Interventional Cardiology

## 2022-07-01 ENCOUNTER — Telehealth: Payer: Self-pay | Admitting: Interventional Cardiology

## 2022-07-01 NOTE — Telephone Encounter (Signed)
Pt c/o medication issue:  1. Name of Medication:  ticagrelor (BRILINTA) 90 MG TABS tablet aspirin EC 81 MG tablet atorvastatin (LIPITOR) 80 MG tablet ezetimibe (ZETIA) 10 MG tablet metoprolol succinate (TOPROL-XL) 25 MG 24 hr tablet  2. How are you currently taking this medication (dosage and times per day)? Eliquis 1 tablet twice a day, aspirin 1 tablet daily, atorvastatin 1 tablet daily, ezetimibe 1 tablet daily, metoprolol 1 tablet daily  3. Are you having a reaction (difficulty breathing--STAT)? no  4. What is your medication issue? Patient states since she was put on all these medications she has started getting dark splotches on her legs. She says she got a couple move over night. She says she has been using a leg massager to help. She says it is just on her legs.

## 2022-07-01 NOTE — Telephone Encounter (Signed)
Left message to call back  

## 2022-07-02 NOTE — Progress Notes (Signed)
Office Visit    Patient Name: [name] Date of Encounter: 07/03/2022  Primary Care Provider:  Darreld Mclean, MD Primary Cardiologist:  Sinclair Grooms, MD Primary Electrophysiologist: None  Chief Complaint    [name] is a 56 y.o. female with PMH of CAD s/p STEMI treated with PCI/DES to RCA, HLD, tobacco abuse, prediabetes, history of DVT, anemia who presents today for post STEMI follow-up.  Past Medical History    Past Medical History:  Diagnosis Date   Acute ST elevation myocardial infarction (STEMI) involving right coronary artery ([place]) 06/24/2022   100% dRCA => DES PCI =.> Synergy XD DES 2.75 x 16-3.0 mm postdilation), reduced to 0%.   Anemia    Chicken pox    Coronary artery disease involving native coronary artery of native heart with unstable angina pectoris ([place]) 06/24/2022   Inf STEMI =>Cardiac Cath-PCI 06/24/2022: RIGHT DOMINANT.  CULPRIT LESION-distal RCA 100% (left to right collaterals) => DES PCI (Synergy XD DES 2.75 x 16-3.0 mm postdilation), reduced to 0%.  60% proximal RCA.  Diffuse 35% proximal LAD.  Normal LCx.   Left leg DVT [facility]) 2014   Uterine leiomyoma    Past Surgical History:  Procedure Laterality Date   BUNIONECTOMY Bilateral 2007   CESAREAN SECTION     x 2   CORONARY/GRAFT ACUTE MI REVASCULARIZATION N/A 06/24/2022   Procedure: Coronary/Graft Acute MI Revascularization;  Surgeon: Belva Crome, MD;  Location: [place] [facility];  Service: Cardiovascular;  Laterality: N/A;   ROBOTIC ASSISTED TOTAL HYSTERECTOMY WITH BILATERAL SALPINGO OOPHERECTOMY N/A 03/03/2016   Procedure: XI ROBOTIC ASSISTED TOTAL HYSTERECTOMY WITH BILATERAL Salipingectomy ;  Surgeon: Everitt Amber, MD;  Location: [facility];  Service: Gynecology;  Laterality: N/A;   TUBAL LIGATION      Allergies  No Known Allergies  History of Present Illness    [name]  is a 56 year old female with the above mention past medical history who presents today for follow-up for  recent STEMI.  [name] presented to the ED on 06/24/2022 with off and on precordial chest pain and left arm discomfort.  She was driven to the ED by her husband and EKG was performed revealing inferior ischemia and eventually STEMI.  She underwent LHC emergently that revealed totally occluded RCA treated with DES/PCI x1.  She developed hypotension postprocedure and was treated with Levophed in the ICU.  She was discharged on 10/20 in stable condition.  She presents today for post hospital follow-up.  [name] presents today for follow-up with her daughter and husband via FaceTime.  Since last being seen in the office patient reports that she is doing well with her only complaint being increased bruising since starting relined.  She has not had any recurrence of jaw pain or shoulder tightness similar to her anginal equivalent.  Her blood pressure today is well controlled at 100/66 and heart rate was 65 bpm.  She is no longer smoking and was congratulated for this.  She is very interested in beginning cardiac rehab and has no complaints of shortness of breath or fatigue with her daily ADLs and routines.  She reports difficulty with sleeping and I advised her to reach out to her PCP regarding medications and recommendations.  Patient denies chest pain, palpitations, dyspnea, PND, orthopnea, nausea, vomiting, dizziness, syncope, edema, weight gain, or early satiety.  Home Medications    Current Outpatient Medications  Medication Sig Dispense Refill   aspirin EC 81 MG tablet Take  1 tablet (81 mg total) by mouth daily. Swallow whole. 30 tablet 3   atorvastatin (LIPITOR) 80 MG tablet Take 1 tablet (80 mg total) by mouth daily. 30 tablet 3   ezetimibe (ZETIA) 10 MG tablet Take 1 tablet (10 mg total) by mouth daily. 30 tablet 3   metoprolol succinate (TOPROL-XL) 25 MG 24 hr tablet Take 0.5 tablets (12.5 mg total) by mouth daily. 30 tablet 11   Multiple Vitamins-Minerals (ONE-A-DAY FOR HER VITACRAVES) CHEW Chew  1 tablet by mouth daily.     nitroGLYCERIN (NITROSTAT) 0.4 MG SL tablet Place 1 tablet (0.4 mg total) under the tongue every 5 (five) minutes as needed. 25 tablet 2   THERAFLU FLU & SORE THROAT 20-10-650 MG PACK Take 1 packet by mouth every 6 (six) hours as needed (for cold-like symptoms, mixed into tea).     ticagrelor (BRILINTA) 90 MG TABS tablet Take 1 tablet (90 mg total) by mouth 2 (two) times daily. 60 tablet 11   No current facility-administered medications for this visit.     Review of Systems  Please see the history of present illness.    (+) Insomnia (+) Bruising on the legs  All other systems reviewed and are otherwise negative except as noted above.  Physical Exam    Wt Readings from Last 3 Encounters:  07/03/22 139 lb 3.2 oz (63.1 kg)  06/24/22 144 lb (65.3 kg)  07/29/20 149 lb (67.6 kg)   VS: Vitals:   07/03/22 0951  BP: 100/66  Pulse: 65  SpO2: 98%  ,Body mass index is 22.47 kg/m.  Constitutional:      Appearance: Healthy appearance. Not in distress.  Neck:     Vascular: JVD normal.  Pulmonary:     Effort: Pulmonary effort is normal.     Breath sounds: No wheezing. No rales. Diminished in the bases Cardiovascular:     Normal rate. Regular rhythm. Normal S1. Normal S2.      Murmurs: There is no murmur.  Edema:    Peripheral edema absent.  Abdominal:     Palpations: Abdomen is soft non tender. There is no hepatomegaly.  Skin:    General: Skin is warm and dry.  Neurological:     General: No focal deficit present.     Mental Status: Alert and oriented to person, place and time.     Cranial Nerves: Cranial nerves are intact.  EKG/LABS/Other Studies Reviewed    ECG personally reviewed by me today -none completed today   Lab Results  Component Value Date   WBC 3.5 (L) 06/26/2022   HGB 11.2 (L) 06/26/2022   HCT 32.8 (L) 06/26/2022   MCV 89.1 06/26/2022   PLT 216 06/26/2022   Lab Results  Component Value Date   CREATININE 0.89 06/25/2022   BUN  7 06/25/2022   NA 139 06/25/2022   K 3.6 06/25/2022   CL 108 06/25/2022   CO2 25 06/25/2022   Lab Results  Component Value Date   ALT 13 06/24/2022   AST 20 06/24/2022   ALKPHOS 62 06/24/2022   BILITOT 0.5 06/24/2022   Lab Results  Component Value Date   CHOL 257 (H) 06/24/2022   HDL 62 06/24/2022   LDLCALC 184 (H) 06/24/2022   TRIG 57 06/24/2022   CHOLHDL 4.1 06/24/2022    Lab Results  Component Value Date   HGBA1C 6.1 (H) 06/24/2022    Assessment & Plan    1.  Coronary artery disease: -s/p inferior MI treated with  DES x1 to occluded RCA -Today patient reports no  recurrence of chest pain or anginal equivalent -Continue GDMT with ASA 81 mg, Brilinta 90 mg twice daily, Lipitor 80 mg, Zetia 10 mg,  -I advised her to take Toprol-XL 12.5 at night to help with sleep and relaxation. -She is interested in cardiac rehab and is ready to begin at this time.  2.  Hyperlipidemia: -Patient's LDL was 184 and was initiated on Lipitor 80 mg daily and Zetia 10 mg -Patient was referred to lipid clinic for further management and possible PCSK9 inhibitor -Lipids and LFTs in December -Patient encouraged to follow heart healthy diet  3.  History of DVT: -Patient suffered unprovoked left leg DVT in 2014 that was treated with Coumadin -She has not had any recurrence since.  4.  Tobacco abuse: -Patient has quit smoking and was congratulated today. -She was advised to reach out if she resumes for assistance with cessation.    Cardiac Rehabilitation Eligibility Assessment  The patient is ready to start cardiac rehabilitation from a cardiac standpoint.     Disposition: Follow-up with Belva Crome III, MD or APP in 1 months    Medication Adjustments/Labs and Tests Ordered: Current medicines are reviewed at length with the patient today.  Concerns regarding medicines are outlined above.   Signed, Mable Fill, Marissa Nestle, NP 07/03/2022, 10:42 AM [facility]  Note:  This document was prepared using Dragon voice recognition software and may include unintentional dictation errors.

## 2022-07-03 ENCOUNTER — Ambulatory Visit: Payer: BC Managed Care – PPO | Attending: Nurse Practitioner | Admitting: Nurse Practitioner

## 2022-07-03 ENCOUNTER — Encounter: Payer: Self-pay | Admitting: Nurse Practitioner

## 2022-07-03 VITALS — BP 100/66 | HR 65 | Ht 66.0 in | Wt 139.2 lb

## 2022-07-03 DIAGNOSIS — E785 Hyperlipidemia, unspecified: Secondary | ICD-10-CM | POA: Diagnosis not present

## 2022-07-03 DIAGNOSIS — I2111 ST elevation (STEMI) myocardial infarction involving right coronary artery: Secondary | ICD-10-CM | POA: Diagnosis not present

## 2022-07-03 DIAGNOSIS — I82409 Acute embolism and thrombosis of unspecified deep veins of unspecified lower extremity: Secondary | ICD-10-CM

## 2022-07-03 DIAGNOSIS — Z720 Tobacco use: Secondary | ICD-10-CM

## 2022-07-03 NOTE — Patient Instructions (Addendum)
Medication Instructions:  Your physician recommends that you continue on your current medications as directed. Please refer to the Current Medication list given to you today.  Do start taking your metoprolol at bedtime  *If you need a refill on your cardiac medications before your next appointment, please call your pharmacy*   Lab Work: Lipids and LFT If you have labs (blood work) drawn today and your tests are completely normal, you will receive your results only by: [place] (if you have MyChart) OR A paper copy in the mail If you have any lab test that is abnormal or we need to change your treatment, we will call you to review the results.   Follow-Up: At [facility], you and your health needs are our priority.  As part of our continuing mission to provide you with exceptional heart care, we have created designated Provider Care Teams.  These Care Teams include your primary Cardiologist (physician) and Advanced Practice Providers (APPs -  Physician Assistants and Nurse Practitioners) who all work together to provide you with the care you need, when you need it.   Your next appointment:   1 month(s)  The format for your next appointment:   In Person  Provider:   Sinclair Grooms, MD  or Ambrose Pancoast, NP

## 2022-07-03 NOTE — Telephone Encounter (Signed)
Replied to MyChart message, patient has appt with Ambrose Pancoast, NP today. Concern to be addressed at appt.

## 2022-07-22 ENCOUNTER — Other Ambulatory Visit (HOSPITAL_COMMUNITY): Payer: Self-pay

## 2022-08-04 NOTE — Progress Notes (Unsigned)
Cardiology Office Note:    Date:  08/06/2022   ID:  [name], DOB 06-Jun-1966, MRN [id number]  PCP:  Darreld Mclean, MD  Cardiologist:  Sinclair Grooms, MD   Referring MD: Darreld Mclean, MD   Chief Complaint  Patient presents with   Coronary Artery Disease   Hypertension   Hyperlipidemia    History of Present Illness:    [name] is a 56 y.o. female with a hx of  CAD s/p STEMI treated with PCI/DES to RCA, HLD, tobacco abuse, prediabetes, history of DVT, and anemia.   States she has been doing well until about 1 week ago.  Having a difficult time sleeping.  Started having lower back discomfort within the past 2 weeks.  Having unusual sensations in her chest.  None are exertion related.  She is compliant with her medications.  She has stopped smoking.  She has not had similar chest discomfort to presentation with MI.  Past Medical History:  Diagnosis Date   Acute ST elevation myocardial infarction (STEMI) involving right coronary artery ([place]) 06/24/2022   100% dRCA => DES PCI =.> Synergy XD DES 2.75 x 16-3.0 mm postdilation), reduced to 0%.   Anemia    Chicken pox    Coronary artery disease involving native coronary artery of native heart with unstable angina pectoris ([place]) 06/24/2022   Inf STEMI =>Cardiac Cath-PCI 06/24/2022: RIGHT DOMINANT.  CULPRIT LESION-distal RCA 100% (left to right collaterals) => DES PCI (Synergy XD DES 2.75 x 16-3.0 mm postdilation), reduced to 0%.  60% proximal RCA.  Diffuse 35% proximal LAD.  Normal LCx.   Left leg DVT [facility]) 2014   Uterine leiomyoma     Past Surgical History:  Procedure Laterality Date   BUNIONECTOMY Bilateral 2007   CESAREAN SECTION     x 2   CORONARY/GRAFT ACUTE MI REVASCULARIZATION N/A 06/24/2022   Procedure: Coronary/Graft Acute MI Revascularization;  Surgeon: Belva Crome, MD;  Location: [place] [facility];  Service: Cardiovascular;  Laterality: N/A;   ROBOTIC ASSISTED TOTAL HYSTERECTOMY WITH BILATERAL  SALPINGO OOPHERECTOMY N/A 03/03/2016   Procedure: XI ROBOTIC ASSISTED TOTAL HYSTERECTOMY WITH BILATERAL Salipingectomy ;  Surgeon: Everitt Amber, MD;  Location: [facility];  Service: Gynecology;  Laterality: N/A;   TUBAL LIGATION      Current Medications: Current Meds  Medication Sig   aspirin EC 81 MG tablet Take 1 tablet (81 mg total) by mouth daily. Swallow whole.   atorvastatin (LIPITOR) 80 MG tablet Take 1 tablet (80 mg total) by mouth daily.   ezetimibe (ZETIA) 10 MG tablet Take 1 tablet (10 mg total) by mouth daily.   Multiple Vitamins-Minerals (ONE-A-DAY FOR HER VITACRAVES) CHEW Chew 1 tablet by mouth daily.   nitroGLYCERIN (NITROSTAT) 0.4 MG SL tablet Place 1 tablet (0.4 mg total) under the tongue every 5 (five) minutes as needed.   THERAFLU FLU & SORE THROAT 20-10-650 MG PACK Take 1 packet by mouth every 6 (six) hours as needed (for cold-like symptoms, mixed into tea).   ticagrelor (BRILINTA) 90 MG TABS tablet Take 1 tablet (90 mg total) by mouth 2 (two) times daily.   [DISCONTINUED] metoprolol succinate (TOPROL-XL) 25 MG 24 hr tablet Take 0.5 tablets (12.5 mg total) by mouth daily.     Allergies:   Patient has no known allergies.   Social History   Socioeconomic History   Marital status: Married    Spouse name: [name]    Number of children: 2   Years of  education: 12+   Highest education level: Not on file  Occupational History   Not on file  Tobacco Use   Smoking status: Former    Packs/day: 0.25    Years: 15.00    Total pack years: 3.75    Types: Cigarettes    Quit date: 2022    Years since quitting: 1.9   Smokeless tobacco: Never  Substance and Sexual Activity   Alcohol use: No   Drug use: No   Sexual activity: Not on file  Other Topics Concern   Not on file  Social History Narrative   Patient lives at home with husband and 2 children. [name]   Patient works at [employer].    Patient has a college education.    Patient is right handed.    Social Determinants of  Health   Financial Resource Strain: Not on file  Food Insecurity: No Food Insecurity (06/26/2022)   Hunger Vital Sign    Worried About Running Out of Food in the Last Year: Never true    Ran Out of Food in the Last Year: Never true  Transportation Needs: No Transportation Needs (06/26/2022)   PRAPARE - Hydrologist (Medical): No    Lack of Transportation (Non-Medical): No  Physical Activity: Not on file  Stress: Not on file  Social Connections: Not on file     Family History: The patient's family history includes Depression in her mother; Diabetes in her maternal grandmother and mother; Heart disease in her maternal grandfather; Stroke in her mother; Tuberculosis in her paternal grandfather.  ROS:   Please see the history of present illness.    Her mother died of cardiac arrest approximately 2 months before the patient had her cardiac event.  She has having a hard time dealing with being "totally healthy" to now being post MI with serious illness.  All other systems reviewed and are negative.  EKGs/Labs/Other Studies Reviewed:    The following studies were reviewed today: October 18 laboratory data: LDL cholesterol 184; total cholesterol 257; A1c 6.1; hemoglobin 11.2; creatinine 0.89  2D Doppler echocardiogram October 2023: MPRESSIONS   1. Left ventricular ejection fraction, by estimation, is 55 to 60%. The  left ventricle has normal function. The left ventricle has no regional  wall motion abnormalities. Left ventricular diastolic parameters were  normal.   2. Right ventricular systolic function is normal. The right ventricular  size is normal. There is normal pulmonary artery systolic pressure.   3. The mitral valve is normal in structure. Trivial mitral valve  regurgitation. No evidence of mitral stenosis.   4. The aortic valve is normal in structure. Aortic valve regurgitation is  not visualized. No aortic stenosis is present.   5. The inferior  vena cava is normal in size with greater than 50%  respiratory variability, suggesting right atrial pressure of 3 mmHg.   Coronary arteriography October 2023: Diagnostic Dominance: Right  Intervention      EKG:  EKG not performed  Recent Labs: 06/24/2022: ALT 13 06/25/2022: BUN 7; Creatinine, Ser 0.89; Magnesium 2.0; Potassium 3.6; Sodium 139 06/26/2022: Hemoglobin 11.2; Platelets 216  Recent Lipid Panel    Component Value Date/Time   CHOL 257 (H) 06/24/2022 0652   TRIG 57 06/24/2022 0652   HDL 62 06/24/2022 0652   CHOLHDL 4.1 06/24/2022 0652   VLDL 11 06/24/2022 0652   LDLCALC 184 (H) 06/24/2022 0652    Physical Exam:    VS:  BP 104/64  Pulse 91   Ht '5\' 6"'$  (1.676 m)   Wt 137 lb 12.8 oz (62.5 kg)   LMP 03/01/2016   SpO2 98%   BMI 22.24 kg/m     Wt Readings from Last 3 Encounters:  08/06/22 137 lb 12.8 oz (62.5 kg)  07/03/22 139 lb 3.2 oz (63.1 kg)  06/24/22 144 lb (65.3 kg)     GEN: Slender. No acute distress HEENT: Normal NECK: No JVD. LYMPHATICS: No lymphadenopathy CARDIAC: No murmur. RRR S4 but no S3 gallop, or edema. VASCULAR:  Normal Pulses. No bruits. RESPIRATORY:  Clear to auscultation without rales, wheezing or rhonchi  ABDOMEN: Soft, non-tender, non-distended, No pulsatile mass, MUSCULOSKELETAL: No deformity  SKIN: Warm and dry NEUROLOGIC:  Alert and oriented x 3 PSYCHIATRIC:  Normal affect   ASSESSMENT:    1. Coronary artery disease involving native coronary artery of native heart with other form of angina pectoris ([place])   2. Acute ST elevation myocardial infarction (STEMI) involving right coronary artery ([place])   3. Hyperlipidemia with target LDL less than 70; > familial hyperlipidemia   4. Tobacco use   5. Situational depression    PLAN:    In order of problems listed above:  She needs cardiac rehab.  May be having side effects of beta-blocker therapy and statin therapy.  Will get lipid level today and see if we can cut the intensity  of rosuvastatin.  Will discontinue beta-blocker therapy. No recurrence Lipid panel today.  Currently on Zetia and high intensity atorvastatin.  May need to temporarily discontinue these medications to see if they are causing back pain and difficulty sleeping.  Will make a dose reduction at the very least based upon findings from laboratory data today. Stop smoking. She is suffering insomnia, muscle aches and pains, and anxiety about her mortality.  We had discussion concerning the frequency of depression after myocardial infarction and coronary bypass surgery.  I think that she is clearly in a mode of situational depression and anxiety.  There may also be medication component that is causing insomnia so therefore we will discontinue the very low-dose beta-blocker.  May also give her a pause from antilipid therapy depending upon findings of today's labs.   Overall education and awareness concerning primary/secondary risk prevention was discussed in detail: LDL less than 70, hemoglobin A1c less than 7, blood pressure target less than 130/80 mmHg, >150 minutes of moderate aerobic activity per week, avoidance of smoking, weight control (via diet and exercise), and continued surveillance/management of/for obstructive sleep apnea.  Will call to see if we can accelerate her entry into phase 2 cardiac rehab.  I think this would be good for her psychological wellbeing.  Discontinue metoprolol  Consider dose and statin therapy or a statin washout to see if she feels better.   Medication Adjustments/Labs and Tests Ordered: Current medicines are reviewed at length with the patient today.  Concerns regarding medicines are outlined above.  Orders Placed This Encounter  Procedures   Comprehensive metabolic panel   Lipid panel   No orders of the defined types were placed in this encounter.   Patient Instructions  Medication Instructions:  Your physician has recommended you make the following change in  your medication:   1) STOP metoprolol succinate (Toprol XL)  *If you need a refill on your cardiac medications before your next appointment, please call your pharmacy*  Lab Work: TODAY: CMET, lipid panel If you have labs (blood work) drawn today and your tests are completely normal, you  will receive your results only by: [place] (if you have MyChart) OR A paper copy in the mail If you have any lab test that is abnormal or we need to change your treatment, we will call you to review the results.  Follow-Up: At [facility], you and your health needs are our priority.  As part of our continuing mission to provide you with exceptional heart care, we have created designated Provider Care Teams.  These Care Teams include your primary Cardiologist (physician) and Advanced Practice Providers (APPs -  Physician Assistants and Nurse Practitioners) who all work together to provide you with the care you need, when you need it.  Your next appointment:   3 month(s)  The format for your next appointment:   In Person  Provider:   Larae Grooms, MD  Important Information About Sugar         Signed, Sinclair Grooms, MD  08/06/2022 1:04 PM    [place]

## 2022-08-05 ENCOUNTER — Ambulatory Visit: Payer: BC Managed Care – PPO | Admitting: Interventional Cardiology

## 2022-08-06 ENCOUNTER — Encounter: Payer: Self-pay | Admitting: Interventional Cardiology

## 2022-08-06 ENCOUNTER — Ambulatory Visit: Payer: BC Managed Care – PPO | Attending: Interventional Cardiology | Admitting: Interventional Cardiology

## 2022-08-06 VITALS — BP 104/64 | HR 91 | Ht 66.0 in | Wt 137.8 lb

## 2022-08-06 DIAGNOSIS — E785 Hyperlipidemia, unspecified: Secondary | ICD-10-CM | POA: Diagnosis not present

## 2022-08-06 DIAGNOSIS — F4321 Adjustment disorder with depressed mood: Secondary | ICD-10-CM

## 2022-08-06 DIAGNOSIS — Z720 Tobacco use: Secondary | ICD-10-CM

## 2022-08-06 DIAGNOSIS — I25118 Atherosclerotic heart disease of native coronary artery with other forms of angina pectoris: Secondary | ICD-10-CM

## 2022-08-06 DIAGNOSIS — I2111 ST elevation (STEMI) myocardial infarction involving right coronary artery: Secondary | ICD-10-CM | POA: Diagnosis not present

## 2022-08-06 NOTE — Patient Instructions (Signed)
Medication Instructions:  Your physician has recommended you make the following change in your medication:   1) STOP metoprolol succinate (Toprol XL)  *If you need a refill on your cardiac medications before your next appointment, please call your pharmacy*  Lab Work: TODAY: CMET, lipid panel If you have labs (blood work) drawn today and your tests are completely normal, you will receive your results only by: [place] (if you have MyChart) OR A paper copy in the mail If you have any lab test that is abnormal or we need to change your treatment, we will call you to review the results.  Follow-Up: At [facility], you and your health needs are our priority.  As part of our continuing mission to provide you with exceptional heart care, we have created designated Provider Care Teams.  These Care Teams include your primary Cardiologist (physician) and Advanced Practice Providers (APPs -  Physician Assistants and Nurse Practitioners) who all work together to provide you with the care you need, when you need it.  Your next appointment:   3 month(s)  The format for your next appointment:   In Person  Provider:   Larae Grooms, MD  Important Information About Sugar

## 2022-08-07 ENCOUNTER — Encounter: Payer: Self-pay | Admitting: Interventional Cardiology

## 2022-08-07 ENCOUNTER — Telehealth: Payer: Self-pay | Admitting: *Deleted

## 2022-08-07 DIAGNOSIS — R7989 Other specified abnormal findings of blood chemistry: Secondary | ICD-10-CM

## 2022-08-07 LAB — COMPREHENSIVE METABOLIC PANEL
ALT: 449 IU/L — ABNORMAL HIGH (ref 0–32)
AST: 284 IU/L — ABNORMAL HIGH (ref 0–40)
Albumin/Globulin Ratio: 1.1 — ABNORMAL LOW (ref 1.2–2.2)
Albumin: 4.2 g/dL (ref 3.8–4.9)
Alkaline Phosphatase: 659 IU/L — ABNORMAL HIGH (ref 44–121)
BUN/Creatinine Ratio: 12 (ref 9–23)
BUN: 10 mg/dL (ref 6–24)
Bilirubin Total: 0.7 mg/dL (ref 0.0–1.2)
CO2: 22 mmol/L (ref 20–29)
Calcium: 9.7 mg/dL (ref 8.7–10.2)
Chloride: 100 mmol/L (ref 96–106)
Creatinine, Ser: 0.86 mg/dL (ref 0.57–1.00)
Globulin, Total: 3.7 g/dL (ref 1.5–4.5)
Glucose: 104 mg/dL — ABNORMAL HIGH (ref 70–99)
Potassium: 4 mmol/L (ref 3.5–5.2)
Sodium: 138 mmol/L (ref 134–144)
Total Protein: 7.9 g/dL (ref 6.0–8.5)
eGFR: 79 mL/min/{1.73_m2} (ref >59)

## 2022-08-07 LAB — LIPID PANEL
Chol/HDL Ratio: 2.1 ratio (ref 0.0–4.4)
Cholesterol, Total: 116 mg/dL (ref 100–199)
HDL: 56 mg/dL (ref >39)
LDL Chol Calc (NIH): 49 mg/dL (ref 0–99)
Triglycerides: 47 mg/dL (ref 0–149)
VLDL Cholesterol Cal: 11 mg/dL (ref 5–40)

## 2022-08-07 NOTE — Telephone Encounter (Signed)
Reviewed labs - liver enzymes are elevated - the patient had STEMI in Oct and started atorvastatin 80 mg and zetia 10 mg.  I spoke with Dr. Burt Knack (DOD) who discussed with Dr. Tamala Julian, and advised to stop atorvastatin, zetia, push po fluids, no tylenol or alcohol.  Repeat LFTs in a week.  The pt reports she is at home and has been feeling poorly all day, lying down, no energy.  She has a headache and just took Tylenol 1000 mg.   She is aware to return for lab appointment in one week.  Also adv if she is feeling any worse at all or notes changes in urine color she should come in to the ER.

## 2022-08-07 NOTE — Addendum Note (Signed)
Addended by: Rodman Key on: 08/07/2022 05:31 PM   Modules accepted: Orders

## 2022-08-08 ENCOUNTER — Emergency Department (HOSPITAL_COMMUNITY): Payer: BC Managed Care – PPO

## 2022-08-08 ENCOUNTER — Other Ambulatory Visit: Payer: Self-pay

## 2022-08-08 ENCOUNTER — Emergency Department (HOSPITAL_COMMUNITY)
Admission: EM | Admit: 2022-08-08 | Discharge: 2022-08-09 | Disposition: A | Discharge status: Home / Self Care | Payer: BC Managed Care – PPO | Attending: Emergency Medicine | Admitting: Emergency Medicine

## 2022-08-08 DIAGNOSIS — M25519 Pain in unspecified shoulder: Secondary | ICD-10-CM | POA: Insufficient documentation

## 2022-08-08 DIAGNOSIS — R945 Abnormal results of liver function studies: Secondary | ICD-10-CM | POA: Insufficient documentation

## 2022-08-08 DIAGNOSIS — R519 Headache, unspecified: Secondary | ICD-10-CM | POA: Insufficient documentation

## 2022-08-08 DIAGNOSIS — Z7982 Long term (current) use of aspirin: Secondary | ICD-10-CM | POA: Diagnosis not present

## 2022-08-08 DIAGNOSIS — Z20822 Contact with and (suspected) exposure to covid-19: Secondary | ICD-10-CM | POA: Diagnosis not present

## 2022-08-08 DIAGNOSIS — R5383 Other fatigue: Secondary | ICD-10-CM | POA: Insufficient documentation

## 2022-08-08 DIAGNOSIS — M545 Low back pain, unspecified: Secondary | ICD-10-CM | POA: Diagnosis not present

## 2022-08-08 DIAGNOSIS — T50905S Adverse effect of unspecified drugs, medicaments and biological substances, sequela: Secondary | ICD-10-CM

## 2022-08-08 DIAGNOSIS — M791 Myalgia, unspecified site: Secondary | ICD-10-CM | POA: Insufficient documentation

## 2022-08-08 DIAGNOSIS — R7989 Other specified abnormal findings of blood chemistry: Secondary | ICD-10-CM

## 2022-08-08 LAB — COMPREHENSIVE METABOLIC PANEL
ALT: 302 U/L — ABNORMAL HIGH (ref 0–44)
AST: 183 U/L — ABNORMAL HIGH (ref 15–41)
Albumin: 3.1 g/dL — ABNORMAL LOW (ref 3.5–5.0)
Alkaline Phosphatase: 590 U/L — ABNORMAL HIGH (ref 38–126)
Anion gap: 10 (ref 5–15)
BUN: 7 mg/dL (ref 6–20)
CO2: 25 mmol/L (ref 22–32)
Calcium: 9.3 mg/dL (ref 8.9–10.3)
Chloride: 104 mmol/L (ref 98–111)
Creatinine, Ser: 0.93 mg/dL (ref 0.44–1.00)
GFR, Estimated: >60 mL/min (ref >60)
Glucose, Bld: 119 mg/dL — ABNORMAL HIGH (ref 70–99)
Potassium: 3.9 mmol/L (ref 3.5–5.1)
Sodium: 139 mmol/L (ref 135–145)
Total Bilirubin: 0.8 mg/dL (ref 0.3–1.2)
Total Protein: 8.4 g/dL — ABNORMAL HIGH (ref 6.5–8.1)

## 2022-08-08 LAB — CBC
HCT: 37 % (ref 36.0–46.0)
Hemoglobin: 11.9 g/dL — ABNORMAL LOW (ref 12.0–15.0)
MCH: 29 pg (ref 26.0–34.0)
MCHC: 32.2 g/dL (ref 30.0–36.0)
MCV: 90 fL (ref 80.0–100.0)
Platelets: 435 10*3/uL — ABNORMAL HIGH (ref 150–400)
RBC: 4.11 MIL/uL (ref 3.87–5.11)
RDW: 13.7 % (ref 11.5–15.5)
WBC: 8.4 10*3/uL (ref 4.0–10.5)
nRBC: 0 % (ref 0.0–0.2)

## 2022-08-08 LAB — TROPONIN I (HIGH SENSITIVITY)
Troponin I (High Sensitivity): 6 ng/L (ref <18)
Troponin I (High Sensitivity): 6 ng/L (ref <18)

## 2022-08-08 LAB — I-STAT BETA HCG BLOOD, ED (MC, WL, AP ONLY): I-stat hCG, quantitative: <5 m[IU]/mL (ref <5)

## 2022-08-08 NOTE — ED Provider Triage Note (Signed)
Emergency Medicine Provider Triage Evaluation Note  [name] , a 56 y.o. female  was evaluated in triage.  Pt complains of bilateral shoulder and back pain. States that same started 2 days ago with associated nausea and vomiting. Denies fevers or shortness of breath. States it feels like when she had a STEMI in October. States that she went to her doctor and they saw her liver enzymes are elevated and they told her to drink plenty of fluids to flush it out. She has been doing same but states she now feels a 'fullness' in her abdomen.   Review of Systems  Positive:  Negative:   Physical Exam  BP 136/85 (BP Location: Right Arm)   Pulse 91   Temp 98.4 F (36.9 C) (Oral)   Resp 18   LMP 03/01/2016   SpO2 97%  Gen:   Awake, no distress   Resp:  Normal effort  MSK:   Moves extremities without difficulty  Other:    Medical Decision Making  Medically screening exam initiated at 8:02 PM.  Appropriate orders placed.  Tahira Olivarez was informed that the remainder of the evaluation will be completed by another provider, this initial triage assessment does not replace that evaluation, and the importance of remaining in the ED until their evaluation is complete.     Nestor Lewandowsky 08/08/22 2004

## 2022-08-08 NOTE — ED Triage Notes (Signed)
Pt with headache, fatigue, nausea, and pain in her shoulders. States all of these symptoms were what she experienced when she had a stent placed in Oct.  Pt states she also had recent labs that showed elevated liver enzymes and was told to try to hydrate well to correct that.

## 2022-08-09 ENCOUNTER — Emergency Department (HOSPITAL_COMMUNITY): Payer: BC Managed Care – PPO

## 2022-08-09 LAB — HEPATITIS PANEL, ACUTE
HCV Ab: NONREACTIVE
Hep A IgM: NONREACTIVE
Hep B C IgM: NONREACTIVE
Hepatitis B Surface Ag: NONREACTIVE

## 2022-08-09 LAB — SARS CORONAVIRUS 2 BY RT PCR: SARS Coronavirus 2 by RT PCR: NEGATIVE

## 2022-08-09 MED ORDER — KETOROLAC TROMETHAMINE 30 MG/ML IJ SOLN
30.0000 mg | Freq: Once | INTRAMUSCULAR | Status: AC
  Administered 2022-08-09: 30 mg via INTRAVENOUS
  Filled 2022-08-09: qty 1

## 2022-08-09 MED ORDER — ONDANSETRON HCL 4 MG/2ML IJ SOLN
4.0000 mg | Freq: Once | INTRAMUSCULAR | Status: AC
  Administered 2022-08-09: 4 mg via INTRAVENOUS
  Filled 2022-08-09: qty 2

## 2022-08-09 MED ORDER — MAGNESIUM SULFATE 2 GM/50ML IV SOLN
2.0000 g | Freq: Once | INTRAVENOUS | Status: AC
  Administered 2022-08-09: 2 g via INTRAVENOUS
  Filled 2022-08-09: qty 50

## 2022-08-09 MED ORDER — IOHEXOL 350 MG/ML SOLN
75.0000 mL | Freq: Once | INTRAVENOUS | Status: AC | PRN
  Administered 2022-08-09: 75 mL via INTRAVENOUS

## 2022-08-09 MED ORDER — SODIUM CHLORIDE 0.9 % IV BOLUS
500.0000 mL | Freq: Once | INTRAVENOUS | Status: AC
  Administered 2022-08-09: 500 mL via INTRAVENOUS

## 2022-08-09 NOTE — ED Provider Notes (Signed)
[facility] EMERGENCY DEPARTMENT Provider Note   CSN: [id number] Arrival date & time: 08/08/22  1827     History  Chief Complaint  Patient presents with   Headache   Fatigue    [name] is a 56 y.o. female.  The history is provided by the patient.  Headache Pain location:  L temporal Quality:  Dull Onset quality:  Gradual Duration:  6 weeks Progression:  Waxing and waning Relieved by:  Nothing Worsened by:  Nothing Ineffective treatments:  Acetaminophen Associated symptoms: fatigue and myalgias   Associated symptoms: no fever, no loss of balance, no neck stiffness, no numbness, no URI and no visual change   Associated symptoms comment:  Shoulder pain and low back pain and now abdominal fullness with nausea since being put on Statins that were stopped due to elevated liver function tests  Risk factors: no anger and no family hx of [place]   Patient seen for a STEMI in October of this year placed on cardiac medications post procedure presents with ongoing L sided headache and shoulder and low back pain since that time.  She was seen earlier in the week by her cardiologist who she reported these symptoms to, labs were run and patient had elevated liver function tests and statins and all medications aside from ASA and Brillenta were discontinued.       Home Medications Prior to Admission medications   Medication Sig Start Date End Date Taking? Authorizing Provider  aspirin EC 81 MG tablet Take 1 tablet (81 mg total) by mouth daily. Swallow whole. 06/26/22 06/26/23  Belva Crome, MD  Multiple Vitamins-Minerals (ONE-A-DAY FOR HER VITACRAVES) CHEW Chew 1 tablet by mouth daily.    [provider]  nitroGLYCERIN (NITROSTAT) 0.4 MG SL tablet Place 1 tablet (0.4 mg total) under the tongue every 5 (five) minutes as needed. 06/26/22   Cheryln Manly, NP  THERAFLU FLU & SORE THROAT 20-10-650 MG PACK Take 1 packet by mouth every 6 (six) hours as needed (for  cold-like symptoms, mixed into tea).    [provider]  ticagrelor (BRILINTA) 90 MG TABS tablet Take 1 tablet (90 mg total) by mouth 2 (two) times daily. 06/26/22   Belva Crome, MD      Allergies    Atorvastatin    Review of Systems   Review of Systems  Constitutional:  Positive for fatigue. Negative for fever.  HENT:  Negative for facial swelling.   Respiratory:  Negative for shortness of breath, wheezing and stridor.   Cardiovascular:  Negative for chest pain and leg swelling.  Musculoskeletal:  Positive for myalgias. Negative for neck stiffness.  Neurological:  Positive for headaches. Negative for numbness and loss of balance.  All other systems reviewed and are negative.   Physical Exam Updated Vital Signs BP 113/77   Pulse 99   Temp 99.3 F (37.4 C) (Oral)   Resp 19   LMP 03/01/2016   SpO2 100%  Physical Exam Vitals and nursing note reviewed.  Constitutional:      General: She is not in acute distress.    Appearance: Normal appearance. She is well-developed.  HENT:     Head: Normocephalic and atraumatic.     Nose: Nose normal.  Eyes:     Pupils: Pupils are equal, round, and reactive to light.  Neck:     Vascular: No carotid bruit.  Cardiovascular:     Rate and Rhythm: Normal rate and regular rhythm.  Pulses: Normal pulses.     Heart sounds: Normal heart sounds.  Pulmonary:     Effort: Pulmonary effort is normal. No respiratory distress.     Breath sounds: Normal breath sounds.  Abdominal:     General: Bowel sounds are normal. There is no distension.     Palpations: Abdomen is soft.     Tenderness: There is no abdominal tenderness. There is no guarding or rebound.  Genitourinary:    Vagina: No vaginal discharge.  Musculoskeletal:        General: Normal range of motion.     Cervical back: Normal range of motion and neck supple. No rigidity or tenderness.  Lymphadenopathy:     Cervical: No cervical adenopathy.  Skin:    General: Skin is dry.      Capillary Refill: Capillary refill takes less than 2 seconds.     Findings: No erythema or rash.  Neurological:     General: No focal deficit present.     Mental Status: She is alert and oriented to person, place, and time.     Deep Tendon Reflexes: Reflexes normal.  Psychiatric:        Thought Content: Thought content normal.     ED Results / Procedures / Treatments   Labs (all labs ordered are listed, but only abnormal results are displayed) Results for orders placed or performed during the hospital encounter of 08/08/22  CBC  Result Value Ref Range   WBC 8.4 4.0 - 10.5 K/uL   RBC 4.11 3.87 - 5.11 MIL/uL   Hemoglobin 11.9 (L) 12.0 - 15.0 g/dL   HCT 37.0 36.0 - 46.0 %   MCV 90.0 80.0 - 100.0 fL   MCH 29.0 26.0 - 34.0 pg   MCHC 32.2 30.0 - 36.0 g/dL   RDW 13.7 11.5 - 15.5 %   Platelets 435 (H) 150 - 400 K/uL   nRBC 0.0 0.0 - 0.2 %  Comprehensive metabolic panel  Result Value Ref Range   Sodium 139 135 - 145 mmol/L   Potassium 3.9 3.5 - 5.1 mmol/L   Chloride 104 98 - 111 mmol/L   CO2 25 22 - 32 mmol/L   Glucose, Bld 119 (H) 70 - 99 mg/dL   BUN 7 6 - 20 mg/dL   Creatinine, Ser 0.93 0.44 - 1.00 mg/dL   Calcium 9.3 8.9 - 10.3 mg/dL   Total Protein 8.4 (H) 6.5 - 8.1 g/dL   Albumin 3.1 (L) 3.5 - 5.0 g/dL   AST 183 (H) 15 - 41 U/L   ALT 302 (H) 0 - 44 U/L   Alkaline Phosphatase 590 (H) 38 - 126 U/L   Total Bilirubin 0.8 0.3 - 1.2 mg/dL   GFR, Estimated >60 >60 mL/min   Anion gap 10 5 - 15  I-Stat beta hCG blood, ED  Result Value Ref Range   I-stat hCG, quantitative <5.0 <5 mIU/mL   Comment 3          Troponin I (High Sensitivity)  Result Value Ref Range   Troponin I (High Sensitivity) 6 <18 ng/L  Troponin I (High Sensitivity)  Result Value Ref Range   Troponin I (High Sensitivity) 6 <18 ng/L   DG Chest 2 View  Result Date: 08/08/2022 CLINICAL DATA:  Chest pain EXAM: CHEST - 2 VIEW COMPARISON:  06/24/2022 FINDINGS: The heart size and mediastinal contours are  within normal limits. Both lungs are clear. The visualized skeletal structures are unremarkable. IMPRESSION: Negative. Electronically Signed   By: Lennette Bihari  [place] M.D.   On: 08/08/2022 21:09    EKG EKG Interpretation  Date/Time:  Saturday August 08 2022 20:10:27 EST Ventricular Rate:  81 PR Interval:  130 QRS Duration: 72 QT Interval:  368 QTC Calculation: 427 R Axis:   61 Text Interpretation: Normal sinus rhythm ST &  unchanged from previous Confirmed by Randal Buba, Mckenzie Bove ([id number]) on 08/08/2022 11:57:17 PM  Radiology DG Chest 2 View  Result Date: 08/08/2022 CLINICAL DATA:  Chest pain EXAM: CHEST - 2 VIEW COMPARISON:  06/24/2022 FINDINGS: The heart size and mediastinal contours are within normal limits. Both lungs are clear. The visualized skeletal structures are unremarkable. IMPRESSION: Negative. Electronically Signed   By: Rolm Baptise M.D.   On: 08/08/2022 21:09    Procedures Procedures    Medications Ordered in ED Medications  magnesium sulfate IVPB 2 g 50 mL (2 g Intravenous New Bag/Given 08/09/22 0107)  ketorolac (TORADOL) 30 MG/ML injection 30 mg (30 mg Intravenous Given 08/09/22 0106)  sodium chloride 0.9 % bolus 500 mL (500 mLs Intravenous New Bag/Given 08/09/22 0106)  ondansetron (ZOFRAN) injection 4 mg (4 mg Intravenous Given 08/09/22 0106)    ED Course/ Medical Decision Making/ A&P                           Medical Decision Making Patient with headache and fatigue and myalgias and nausea and elevated LFTS  Amount and/or Complexity of Data Reviewed External Data Reviewed: labs and notes.    Details: Previous notes reviewed, labs from cardiology visit reviewed.   Labs: ordered.    Details: All labs reviewed:  2 negative troponins 6/6.  Negative pregnancy test.  Normal white count 8.4, low hemoglobin but consistent 11.9, elevated platelets 435k.  Normal sodium 139, normal potassium and normal creatinine .93.  Normal bilirubin .8.  AST elevated 183, elevated ALT 302 these  numbers are improved from previous on 08/06/22 Radiology: ordered and independent interpretation performed.    Details: Negative CXR, negative CT by me  ECG/medicine tests: ordered and independent interpretation performed. Decision-making details documented in ED Course.  Risk Prescription drug management. Risk Details: Likely adverse reaction to cardiac medications which have been discontinued.  I have discussed this with pharmacist who states it can take up to one month for LFTs to recover in statin patients.  I will refer to PMD and GI for ongoing care.  I have discussed the results of all tests with patient and her spouse and plan of care for follow up. Hepatitis panel sent and can be followed up by PMD.  Stable for discharge.  Strict return.      Final Clinical Impression(s) / ED Diagnoses Final diagnoses:  None   Return for intractable cough, coughing up blood, fevers > 100.4 unrelieved by medication, shortness of breath, intractable vomiting, chest pain, shortness of breath, weakness, numbness, changes in speech, facial asymmetry, abdominal pain, passing out, Inability to tolerate liquids or food, cough, altered mental status or any concerns. No signs of systemic illness or infection. The patient is nontoxic-appearing on exam and vital signs are within normal limits.  I have reviewed the triage vital signs and the nursing notes. Pertinent labs & imaging results that were available during my care of the patient were reviewed by me and considered in my medical decision making (see chart for details). After history, exam, and medical workup I feel the patient has been appropriately medically screened and is safe for discharge home. Pertinent diagnoses were  discussed with the patient. Patient was given return precautions.  Rx / DC Orders ED Discharge Orders     None         Nilay Mangrum, MD 08/09/22 [phone]

## 2022-08-10 ENCOUNTER — Other Ambulatory Visit: Payer: BC Managed Care – PPO

## 2022-08-11 NOTE — Telephone Encounter (Signed)
Spoke with patient to follow-up on how she is feeling.  Patient states she is feeling better now than she was before the weekend. She states she has had a change in taste, her usual coffee tastes "like chemicals". Patient reports she was tested for COVID in ED on 08/07/22 and she tested at home, both tests negative.  Patient has stopped atorvastatin and zetia. She has not had any Tylenol or alcohol. She is pushing PO fluids. She states she was told in ED it could take 3 weeks for medication to clear her system and LFTs to improve.  Lab appt for 08/14/22 scheduled to re-check LFTs. Patient states she has an appt with PCP tomorrow (08/12/22).  She reports she is feeling better, but isn't sure why her taste has changed. She expressed appreciation for follow-up.

## 2022-08-11 NOTE — Telephone Encounter (Signed)
-----   Message from Cynthia Crome, MD sent at 08/10/2022  6:44 PM EST ----- Regarding: RE: ok to proceed with Cardiac Rehab?  Brief ER visit on 12/2 Yes, please begin when she feels up to it. Hopefully soon. ----- Message ----- From: Rowe Pavy, RN Sent: 08/10/2022   1:34 PM EST To: Cynthia Crome, MD Subject: ok to proceed with Cardiac Rehab?  Brief ER #   Hello Dr. Tamala Julian,  The above patient seen by you on 11/29 post hospitalization for 10/18 STEMI/DES RCA. [name] presented to the ER on 12/2 with complaint of headache, nausea and pain in her shoulders.  This was very similar to the symptoms she presented with in October.  ER deemed symptoms related to adverse reaction to the medications. (Liver panel abnormal however improved from previous lab draw) Statin therapy discontinued.  Ok to proceed with Cardiac rehab without post ER visit office visit by cardiology?  Note she has a follow up with PCP - Dr. Janett Billow Copland on 12/6  Thanks Marshelle Bilger Wilber Oliphant RN, BSN Cardiac and Pulmonary Rehab Nurse Navigator

## 2022-08-11 NOTE — Progress Notes (Addendum)
[facility] [address], [address] [place], [place] [address] [phone]  Date:  08/12/2022   Name:  [name]   DOB:  12/24/1965   MRN:  [id number]  PCP:  Pearline Cables, MD    Chief Complaint: Follow up- ER (08/08/22: Adverse effect of drug, sequela/Concerns/ questions: pts weight loss is concerning to her. Pt would like to ask about the medications that she was placed on after her stent- she says the ER docs informed her that this  was the cause of her feeling so poorly.  )   History of Present Illness:  [name] is a 56 y.o. very pleasant female patient who presents with the following:  Patient seen today for follow-up from the emergency department-I saw her 2 years ago She was in the ER on 12/2 with an apparent reaction to statin She had a STEMI in October, treated with PCI There is a family history of heart disease- her mother died of acute cardiac arrest this past summer  She was of course started on a statin after her cardiac procedure-however unfortunately she had acute transaminitis likely due to statin drug  She went to the ER on 12/2 with headache, nausea, backache- her LFTs were up from her cardiologist visit on 11/30 as well.  She was taken off her statin by cardiology, LFTs were trending down by the time she was seen in the ER CT scan abdomen pelvis normal, acute hepatitis panel negative  She does not use alcohol, she is not using tylenol now   She did quit smoking last year  She has lost some weight over the last 3 weeks  She does note she is feeling much better since coming off her statin   Patient has had a rough year.  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 who does not live locally Admits she is having some difficulty sleeping She notes she has lost some weight over the last several months  Declines flu shot today because "it  always makes me sick"  Wt Readings from Last 3 Encounters:  08/12/22 135 lb (61.2 kg)  08/06/22 137 lb 12.8 oz (62.5 kg)  07/03/22 139 lb 3.2 oz (63.1 kg)    Patient Active Problem List   Diagnosis Date Noted   Hyperlipidemia with target LDL less than 70; > familial hyperlipidemia 06/25/2022   Preeclampsia 06/24/2022   Gestational diabetes 06/24/2022   Prediabetes 06/24/2022   Tobacco use 06/24/2022   Acute ST elevation myocardial infarction (STEMI) involving right coronary artery (HCC) 06/24/2022   Coronary artery disease involving native coronary artery of native heart with unstable angina pectoris (HCC) 06/24/2022   Status post insertion of drug eluting coronary artery stent 06/24/2022   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   Anemia    Chicken pox    Coronary artery disease involving native coronary artery of native heart with unstable angina pectoris (HCC) 06/24/2022   Inf STEMI =>Cardiac  Cath-PCI 06/24/2022: RIGHT DOMINANT.  CULPRIT LESION-distal RCA 100% (left to right collaterals) => DES PCI (Synergy XD DES 2.75 x 16-3.0 mm postdilation), reduced to 0%.  60% proximal RCA.  Diffuse 35% proximal LAD.  Normal LCx.   Left leg DVT [facility]) 2014   Uterine leiomyoma     Past Surgical History:  Procedure Laterality Date   BUNIONECTOMY Bilateral 2007   CESAREAN SECTION     x 2   CORONARY/GRAFT ACUTE MI REVASCULARIZATION N/A 06/24/2022   Procedure: Coronary/Graft Acute MI Revascularization;  Surgeon: Lyn Records, MD;  Location: [facility] INVASIVE CV LAB;   Service: Cardiovascular;  Laterality: N/A;   ROBOTIC ASSISTED TOTAL HYSTERECTOMY WITH BILATERAL SALPINGO OOPHERECTOMY N/A 03/03/2016   Procedure: XI ROBOTIC ASSISTED TOTAL HYSTERECTOMY WITH BILATERAL Salipingectomy ;  Surgeon: Adolphus Birchwood, MD;  Location: [facility] ORS;  Service: Gynecology;  Laterality: N/A;   TUBAL LIGATION      Social History   Tobacco Use   Smoking status: Former    Packs/day: 0.25    Years: 15.00    Total pack years: 3.75    Types: Cigarettes    Quit date: 2022    Years since quitting: 1.9   Smokeless tobacco: Never  Substance Use Topics   Alcohol use: No   Drug use: No    Family History  Problem Relation Age of Onset   Diabetes Mother    Depression Mother    Stroke Mother    Diabetes Maternal Grandmother    Heart disease Maternal Grandfather    Tuberculosis Paternal Grandfather     Allergies  Allergen Reactions   Atorvastatin Other (See Comments)    Elevated liver functions    Medication list has been reviewed and updated.  Current Outpatient Medications on File Prior to Visit  Medication Sig Dispense Refill   aspirin EC 81 MG tablet Take 1 tablet (81 mg total) by mouth daily. Swallow whole. 30 tablet 3   Multiple Vitamins-Minerals (ONE-A-DAY FOR HER VITACRAVES) CHEW Chew 1 tablet by mouth daily.     nitroGLYCERIN (NITROSTAT) 0.4 MG SL tablet Place 1 tablet (0.4 mg total) under the tongue every 5 (five) minutes as needed. 25 tablet 2   THERAFLU FLU & SORE THROAT 20-10-650 MG PACK Take 1 packet by mouth every 6 (six) hours as needed (for cold-like symptoms, mixed into tea).     ticagrelor (BRILINTA) 90 MG TABS tablet Take 1 tablet (90 mg total) by mouth 2 (two) times daily. 60 tablet 11   No current facility-administered medications on file prior to visit.    Review of Systems:  As per HPI- otherwise negative.   Physical Examination: Vitals:   08/12/22 1001  BP: 110/60  Pulse: 85  Resp: 18  Temp: 97.7 F (36.5 C)  SpO2: 98%   Vitals:    08/12/22 1001  Weight: 135 lb (61.2 kg)  Height: 5\' 6"  (1.676 m)   Body mass index is 21.79 kg/m. Ideal Body Weight: Weight in (lb) to have BMI = 25: 154.6  GEN: no acute distress.  Petite build, looks well HEENT: Atraumatic, Normocephalic.  No jaundice Ears and Nose: No external deformity. CV: RRR, No M/G/R. No JVD. No thrill. No extra heart sounds. PULM: CTA B, no wheezes, crackles, rhonchi. No retractions. No resp. distress. No accessory muscle use. ABD: S, NT, ND, +BS. No rebound. No HSM.  Belly is benign EXTR: No c/c/e PSYCH: Normally interactive. Conversant.    Assessment and Plan: Transaminitis - Plan: Hepatic function panel  Weight loss - Plan: TSH  Patient seen today with concern of transaminitis, potentially related to statin use.  She has been off her statin for about 1 week now, will check liver function test today Her cardiologist plans to refer her to lipid clinic for neck steps regarding her lipid management [name] notes she has also lost some weight recently, she has been under a fair amount of stress and lost her mother.  We will check her TSH today  I encouraged her to follow-up with me soon for health maintenance  Signed Abbe Amsterdam, MD  Received her labs, message to patient Results for orders placed or performed in visit on 08/12/22  Hepatic function panel  Result Value Ref Range   Total Bilirubin 0.6 0.2 - 1.2 mg/dL   Bilirubin, Direct 0.2 0.0 - 0.3 mg/dL   Alkaline Phosphatase 632 (H) 39 - 117 U/L   AST 50 (H) 0 - 37 U/L   ALT 123 (H) 0 - 35 U/L   Total Protein 8.0 6.0 - 8.3 g/dL   Albumin 3.8 3.5 - 5.2 g/dL  TSH  Result Value Ref Range   TSH 1.34 0.35 - 5.50 uIU/mL

## 2022-08-12 ENCOUNTER — Ambulatory Visit: Payer: BC Managed Care – PPO | Admitting: Family Medicine

## 2022-08-12 ENCOUNTER — Encounter: Payer: Self-pay | Admitting: Family Medicine

## 2022-08-12 ENCOUNTER — Other Ambulatory Visit: Payer: Self-pay | Admitting: Interventional Cardiology

## 2022-08-12 VITALS — BP 110/60 | HR 85 | Temp 97.7°F | Resp 18 | Ht 66.0 in | Wt 135.0 lb

## 2022-08-12 DIAGNOSIS — R634 Abnormal weight loss: Secondary | ICD-10-CM

## 2022-08-12 DIAGNOSIS — E785 Hyperlipidemia, unspecified: Secondary | ICD-10-CM

## 2022-08-12 DIAGNOSIS — R7401 Elevation of levels of liver transaminase levels: Secondary | ICD-10-CM | POA: Diagnosis not present

## 2022-08-12 LAB — HEPATIC FUNCTION PANEL
ALT: 123 U/L — ABNORMAL HIGH (ref 0–35)
AST: 50 U/L — ABNORMAL HIGH (ref 0–37)
Albumin: 3.8 g/dL (ref 3.5–5.2)
Alkaline Phosphatase: 632 U/L — ABNORMAL HIGH (ref 39–117)
Bilirubin, Direct: 0.2 mg/dL (ref 0.0–0.3)
Total Bilirubin: 0.6 mg/dL (ref 0.2–1.2)
Total Protein: 8 g/dL (ref 6.0–8.3)

## 2022-08-12 LAB — TSH: TSH: 1.34 u[IU]/mL (ref 0.35–5.50)

## 2022-08-12 NOTE — Addendum Note (Signed)
Addended by: Lamar Blinks C on: 08/12/2022 04:40 PM   Modules accepted: Orders

## 2022-08-12 NOTE — Progress Notes (Signed)
Referred to Lipid Clinic per Dr. Tamala Julian.

## 2022-08-12 NOTE — Patient Instructions (Signed)
Good to see you today but I am sorry this year has been so hard I will be in touch with your labs asap and will send them to Dr Tamala Julian as well- you don't need to get labs done on Friday also   Assuming your liver is getting better we can talk about medication for sleep also

## 2022-08-12 NOTE — Telephone Encounter (Signed)
Ordered Lipid Clinic referral, sent patient message via [facility].

## 2022-08-14 ENCOUNTER — Other Ambulatory Visit: Payer: BC Managed Care – PPO

## 2022-08-14 ENCOUNTER — Telehealth: Payer: Self-pay | Admitting: Interventional Cardiology

## 2022-08-14 NOTE — Telephone Encounter (Signed)
Patient stated she had labs drawn at her PCP's office and wants to know if she still needs to have further labs done.

## 2022-08-14 NOTE — Telephone Encounter (Signed)
Returned call to patient. Made her aware that since her PCP checked her LFTs on 12/6 she did not need to keep her lab appointment today. Made her aware that I would let Dr. Tamala Julian know so that he can review the results.

## 2022-09-10 ENCOUNTER — Telehealth (HOSPITAL_COMMUNITY): Payer: Self-pay

## 2022-09-10 NOTE — Telephone Encounter (Signed)
Called patient to see if she was interested in participating in the Cardiac Rehab Program. Patient stated yes. Patient will come in for orientation on 09/15/22'@8'$ :30am and will attend the 8:15am exercise class.

## 2022-09-10 NOTE — Telephone Encounter (Signed)
Pt insurance is active and benefits verified through [employer] Co-pay 0, DED $100/0 met, out of pocket $1,000/0 met, co-insurance 20%. no pre-authorization required. Passport, 09/10/2022_0 :27pm, REF# [id number]   How many CR sessions are covered? (72 sessions for ICR)72 Is this a lifetime maximum or an annual maximum? annual Has the member used any of these services to date? no Is there a time limit (weeks/months) on start of program and/or program completion? no

## 2022-09-11 ENCOUNTER — Telehealth (HOSPITAL_COMMUNITY): Payer: Self-pay

## 2022-09-15 ENCOUNTER — Inpatient Hospital Stay (HOSPITAL_COMMUNITY): Admission: RE | Admit: 2022-09-15 | Payer: BC Managed Care – PPO | Source: Ambulatory Visit

## 2022-09-15 ENCOUNTER — Telehealth (HOSPITAL_COMMUNITY): Payer: Self-pay | Admitting: *Deleted

## 2022-09-15 NOTE — Telephone Encounter (Signed)
Patient left message on department voicemail today. She will be unable to attend cardiac rehab orientation today and would like to reschedule. Will give patient's chart to schedulers.

## 2022-09-17 ENCOUNTER — Telehealth (HOSPITAL_COMMUNITY): Payer: Self-pay

## 2022-09-17 NOTE — Telephone Encounter (Signed)
Confirmed appt for 09/22/22 @ 800. Completed H/H and answered all questions for CRP2.

## 2022-09-21 ENCOUNTER — Ambulatory Visit (HOSPITAL_COMMUNITY): Payer: BC Managed Care – PPO

## 2022-09-22 ENCOUNTER — Encounter (HOSPITAL_COMMUNITY)
Admission: RE | Admit: 2022-09-22 | Discharge: 2022-09-22 | Disposition: A | Discharge status: Home / Self Care | Payer: BC Managed Care – PPO | Source: Ambulatory Visit | Attending: Interventional Cardiology | Admitting: Interventional Cardiology

## 2022-09-22 ENCOUNTER — Encounter (HOSPITAL_COMMUNITY): Payer: Self-pay

## 2022-09-22 VITALS — BP 112/82 | HR 77 | Ht 68.75 in | Wt 145.3 lb

## 2022-09-22 DIAGNOSIS — I2111 ST elevation (STEMI) myocardial infarction involving right coronary artery: Secondary | ICD-10-CM | POA: Diagnosis present

## 2022-09-22 DIAGNOSIS — Z48812 Encounter for surgical aftercare following surgery on the circulatory system: Secondary | ICD-10-CM | POA: Insufficient documentation

## 2022-09-22 DIAGNOSIS — Z955 Presence of coronary angioplasty implant and graft: Secondary | ICD-10-CM | POA: Diagnosis not present

## 2022-09-22 DIAGNOSIS — I252 Old myocardial infarction: Secondary | ICD-10-CM | POA: Insufficient documentation

## 2022-09-22 NOTE — Progress Notes (Signed)
Cardiac Individual Treatment Plan  Patient Details  Name: [name] MRN: [id number] Date of Birth: February 16, 1966 Referring Provider:   Flowsheet Row INTENSIVE CARDIAC REHAB ORIENT from 09/22/2022 in [facility] for Heart, Vascular, & [place]  Referring Provider Larae Grooms, MD       Initial Encounter Date:  [place] from 09/22/2022 in [facility] for Heart, Vascular, & [facility]  Date 09/22/22       Visit Diagnosis: 06/24/2022 STEMI  06/24/22 Status post insertion of drug eluting coronary artery stent/RCA  Patient's Home Medications on Admission:  Current Outpatient Medications:    aspirin EC 81 MG tablet, Take 1 tablet (81 mg total) by mouth daily. Swallow whole., Disp: 30 tablet, Rfl: 3   melatonin 5 MG TABS, Take 10 mg by mouth at bedtime., Disp: , Rfl:    Multiple Vitamins-Minerals (ONE-A-DAY FOR HER VITACRAVES) CHEW, Chew 1 tablet by mouth in the morning., Disp: , Rfl:    nitroGLYCERIN (NITROSTAT) 0.4 MG SL tablet, Place 1 tablet (0.4 mg total) under the tongue every 5 (five) minutes as needed., Disp: 25 tablet, Rfl: 2   ticagrelor (BRILINTA) 90 MG TABS tablet, Take 1 tablet (90 mg total) by mouth 2 (two) times daily., Disp: 60 tablet, Rfl: 11  Past Medical History: Past Medical History:  Diagnosis Date   Acute ST elevation myocardial infarction (STEMI) involving right coronary artery ([place]) 06/24/2022   100% dRCA => DES PCI =.> Synergy XD DES 2.75 x 16-3.0 mm postdilation), reduced to 0%.   Anemia    Chicken pox    Coronary artery disease involving native coronary artery of native heart with unstable angina pectoris ([place]) 06/24/2022   Inf STEMI =>Cardiac Cath-PCI 06/24/2022: RIGHT DOMINANT.  CULPRIT LESION-distal RCA 100% (left to right collaterals) => DES PCI (Synergy XD DES 2.75 x 16-3.0 mm postdilation), reduced to 0%.  60% proximal RCA.  Diffuse 35% proximal LAD.  Normal LCx.    Left leg DVT ([place]) 2014   Uterine leiomyoma     Tobacco Use: Social History   Tobacco Use  Smoking Status Former   Packs/day: 0.25   Years: 15.00   Total pack years: 3.75   Types: Cigarettes   Quit date: 2022   Years since quitting: 2.0  Smokeless Tobacco Never    Labs: Review Flowsheet       Latest Ref Rng & Units 05/05/2018 07/29/2020 06/24/2022 08/06/2022  Labs for ITP Cardiac and Pulmonary Rehab  Cholestrol 100 - 199 mg/dL 246  246  257  116   LDL (calc) 0 - 99 mg/dL 167  162  184  49   HDL-C >39 mg/dL 68.60  71.50  62  56   Trlycerides 0 - 149 mg/dL 55.0  66.0  57  47   Hemoglobin A1c 4.8 - 5.6 % 6.4  6.3  6.1  -  TCO2 22 - 32 mmol/L - - 29  -    Capillary Blood Glucose: No results found for: "GLUCAP"   Exercise Target Goals: Exercise Program Goal: Individual exercise prescription set using results from initial 6 min walk test and THRR while considering  patient's activity barriers and safety.   Exercise Prescription Goal: Initial exercise prescription builds to 30-45 minutes a day of aerobic activity, 2-3 days per week.  Home exercise guidelines will be given to patient during program as part of exercise prescription that the participant will acknowledge.  Activity Barriers & Risk Stratification:  Activity Barriers & Cardiac Risk Stratification - 09/22/22 0950       Activity Barriers & Cardiac Risk Stratification   Activity Barriers None    Cardiac Risk Stratification Moderate             6 Minute Walk:  6 Minute Walk     Row Name 09/22/22 0916         6 Minute Walk   Phase Initial     Distance 1263 feet     Walk Time 6 minutes     # of Rest Breaks 0     MPH 2.4     METS 3.8     RPE 7     Perceived Dyspnea  0     VO2 Peak 13.3     Symptoms No     Resting HR 87 bpm     Resting BP 112/82     Resting Oxygen Saturation  100 %     Exercise Oxygen Saturation  during 6 min walk 100 %     Max Ex. HR 82 bpm     Max Ex. BP 128/80     2  Minute Post BP 114/78              Oxygen Initial Assessment:   Oxygen Re-Evaluation:   Oxygen Discharge (Final Oxygen Re-Evaluation):   Initial Exercise Prescription:  Initial Exercise Prescription - 09/22/22 0900       Date of Initial Exercise RX and Referring Provider   Date 09/22/22    Referring Provider Larae Grooms, MD    Expected Discharge Date 11/20/22      Arm Ergometer   Level 1.3    Watts 15    RPM 60    Minutes 15    METs 3.8      Recumbant Elliptical   Level 2    RPM 60    Watts 25    Minutes 15    METs 3.8      Prescription Details   Frequency (times per week) 3    Duration Progress to 30 minutes of continuous aerobic without signs/symptoms of physical distress      Intensity   THRR 40-80% of Max Heartrate 66 - 132    Ratings of Perceived Exertion 11-13    Perceived Dyspnea 0-4      Progression   Progression Continue progressive overload as per policy without signs/symptoms or physical distress.      Resistance Training   Training Prescription Yes    Weight 3 lbs    Reps 10-15             Perform Capillary Blood Glucose checks as needed.  Exercise Prescription Changes:   Exercise Comments:   Exercise Goals and Review:   Exercise Goals     Row Name 09/22/22 0949             Exercise Goals   Increase Physical Activity Yes       Intervention Provide advice, education, support and counseling about physical activity/exercise needs.;Develop an individualized exercise prescription for aerobic and resistive training based on initial evaluation findings, risk stratification, comorbidities and participant's personal goals.       Expected Outcomes Short Term: Attend rehab on a regular basis to increase amount of physical activity.;Long Term: Add in home exercise to make exercise part of routine and to increase amount of physical activity.;Long Term: Exercising regularly at least 3-5 days a week.       Increase  Strength and  Stamina Yes       Intervention Provide advice, education, support and counseling about physical activity/exercise needs.;Develop an individualized exercise prescription for aerobic and resistive training based on initial evaluation findings, risk stratification, comorbidities and participant's personal goals.       Expected Outcomes Short Term: Increase workloads from initial exercise prescription for resistance, speed, and METs.;Short Term: Perform resistance training exercises routinely during rehab and add in resistance training at home;Long Term: Improve cardiorespiratory fitness, muscular endurance and strength as measured by increased METs and functional capacity (6MWT)       Able to understand and use rate of perceived exertion (RPE) scale Yes       Intervention Provide education and explanation on how to use RPE scale       Expected Outcomes Short Term: Able to use RPE daily in rehab to express subjective intensity level;Long Term:  Able to use RPE to guide intensity level when exercising independently       Knowledge and understanding of Target Heart Rate Range (THRR) Yes       Intervention Provide education and explanation of THRR including how the numbers were predicted and where they are located for reference       Expected Outcomes Short Term: Able to state/look up THRR;Short Term: Able to use daily as guideline for intensity in rehab;Long Term: Able to use THRR to govern intensity when exercising independently       Understanding of Exercise Prescription Yes       Intervention Provide education, explanation, and written materials on patient's individual exercise prescription       Expected Outcomes Short Term: Able to explain program exercise prescription;Long Term: Able to explain home exercise prescription to exercise independently                Exercise Goals Re-Evaluation :   Discharge Exercise Prescription (Final Exercise Prescription Changes):   Nutrition:  Target  Goals: Understanding of nutrition guidelines, daily intake of sodium '1500mg'$ , cholesterol '200mg'$ , calories 30% from fat and 7% or less from saturated fats, daily to have 5 or more servings of fruits and vegetables.  Biometrics:  Pre Biometrics - 09/22/22 0815       Pre Biometrics   Waist Circumference 32 inches    Hip Circumference 39.5 inches    Waist to Hip Ratio 0.81 %    Triceps Skinfold 10 mm    % Body Fat 28.1 %    Grip Strength 26 kg    Flexibility 13.5 in    Single Leg Stand 30 seconds              Nutrition Therapy Plan and Nutrition Goals:   Nutrition Assessments:  MEDIFICTS Score Key: ?70 Need to make dietary changes  40-70 Heart Healthy Diet ? 40 Therapeutic Level Cholesterol Diet    Picture Your Plate Scores: <17 Unhealthy dietary pattern with much room for improvement. 41-50 Dietary pattern unlikely to meet recommendations for good health and room for improvement. 51-60 More healthful dietary pattern, with some room for improvement.  >60 Healthy dietary pattern, although there may be some specific behaviors that could be improved.    Nutrition Goals Re-Evaluation:   Nutrition Goals Re-Evaluation:   Nutrition Goals Discharge (Final Nutrition Goals Re-Evaluation):   Psychosocial: Target Goals: Acknowledge presence or absence of significant depression and/or stress, maximize coping skills, provide positive support system. Participant is able to verbalize types and ability to use techniques and skills needed for reducing stress  and depression.  Initial Review & Psychosocial Screening:  Initial Psych Review & Screening - 09/22/22 0845       Initial Review   Current issues with Current Stress Concerns;Current Sleep Concerns    Source of Stress Concerns Chronic Illness;Family    Comments Mother died suddenly in 28-Apr-2023 of this past year. [name] had MI in October had side effects from medications. [name] says she has had difficulty sleeping since her mother  passed away this past summer. [name] and her mother were very close they talked daily.      Family Dynamics   Good Support System? Yes   [name] has her husband, children, siblings and father for support   Comments [name] says that she has a good      Barriers   Psychosocial barriers to participate in program The patient should benefit from training in stress management and relaxation.      Screening Interventions   Interventions Encouraged to exercise;To provide support and resources with identified psychosocial needs    Expected Outcomes Long Term Goal: Stressors or current issues are controlled or eliminated.             Quality of Life Scores:  Quality of Life - 09/22/22 1024       Quality of Life   Select Quality of Life      Quality of Life Scores   Health/Function Pre 28.33 %    Socioeconomic Pre 30 %    Psych/Spiritual Pre 30 %    Family Pre 30 %    GLOBAL Pre 29.29 %            Scores of 19 and below usually indicate a poorer quality of life in these areas.  A difference of  2-3 points is a clinically meaningful difference.  A difference of 2-3 points in the total score of the Quality of Life Index has been associated with significant improvement in overall quality of life, self-image, physical symptoms, and general health in studies assessing change in quality of life.  PHQ-9: Review Flowsheet       09/22/2022  Depression screen PHQ 2/9  Decreased Interest 0  Down, Depressed, Hopeless 0  PHQ - 2 Score 0  Altered sleeping 1  Tired, decreased energy 0  Change in appetite 0  Feeling bad or failure about yourself  0  Trouble concentrating 0  Moving slowly or fidgety/restless 0  Suicidal thoughts 0  PHQ-9 Score 1  Difficult doing work/chores Somewhat difficult   Interpretation of Total Score  Total Score Depression Severity:  1-4 = Minimal depression, 5-9 = Mild depression, 10-14 = Moderate depression, 15-19 = Moderately severe depression, 20-27 = Severe  depression   Psychosocial Evaluation and Intervention:   Psychosocial Re-Evaluation:   Psychosocial Discharge (Final Psychosocial Re-Evaluation):   Vocational Rehabilitation: Provide vocational rehab assistance to qualifying candidates.   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 Participant will state understanding/return demonstration of topics presented.     Core Videos: Exercise    Move It!  Clinical staff conducted group or individual video education with verbal and  written material and guidebook.  Patient learns the recommended Pritikin exercise program. Exercise with the goal of living a long, healthy life. Some of the health benefits of exercise include controlled diabetes, healthier blood pressure levels, improved cholesterol levels, improved heart and lung capacity, improved sleep, and better body composition. Everyone should speak with their doctor before starting or changing an exercise routine.  Biomechanical Limitations Clinical staff conducted group or individual video education with verbal and written material and guidebook.  Patient learns how biomechanical limitations can impact exercise and how we can mitigate and possibly overcome limitations to have an impactful and balanced exercise routine.  Body Composition Clinical staff conducted group or individual video education with verbal and written material and guidebook.  Patient learns that body composition (ratio of muscle mass to fat mass) is a key component to assessing overall fitness, rather than body weight alone. Increased fat mass, especially  visceral belly fat, can put [place] at increased risk for metabolic syndrome, type 2 diabetes, heart disease, and even death. It is recommended to combine diet and exercise (cardiovascular and resistance training) to improve your body composition. Seek guidance from your physician and exercise physiologist before implementing an exercise routine.  Exercise Action Plan Clinical staff conducted group or individual video education with verbal and written material and guidebook.  Patient learns the recommended strategies to achieve and enjoy long-term exercise adherence, including variety, self-motivation, self-efficacy, and positive decision making. Benefits of exercise include fitness, good health, weight management, more energy, better sleep, less stress, and overall well-being.  Medical   Heart Disease Risk Reduction Clinical staff conducted group or individual video education with verbal and written material and guidebook.  Patient learns our heart is our most vital organ as it circulates oxygen, nutrients, white blood cells, and hormones throughout the entire body, and carries waste away. Data supports a plant-based eating plan like the Pritikin Program for its effectiveness in slowing progression of and reversing heart disease. The video provides a number of recommendations to address heart disease.   Metabolic Syndrome and Belly Fat  Clinical staff conducted group or individual video education with verbal and written material and guidebook.  Patient learns what metabolic syndrome is, how it leads to heart disease, and how one can reverse it and keep it from coming back. You have metabolic syndrome if you have 3 of the following 5 criteria: abdominal obesity, high blood pressure, high triglycerides, low HDL cholesterol, and high blood sugar.  Hypertension and Heart Disease Clinical staff conducted group or individual video education with verbal and written material and guidebook.  Patient learns that  high blood pressure, or hypertension, is very common in the [place]. Hypertension is largely due to excessive salt intake, but other important risk factors include being overweight, physical inactivity, drinking too much alcohol, smoking, and not eating enough potassium from fruits and vegetables. High blood pressure is a leading risk factor for heart attack, stroke, congestive heart failure, dementia, kidney failure, and premature death. Long-term effects of excessive salt intake include stiffening of the arteries and thickening of heart muscle and organ damage. Recommendations include ways to reduce hypertension and the risk of heart disease.  Diseases of Our Time - Focusing on Diabetes Clinical staff conducted group or individual video education with verbal and written material and guidebook.  Patient learns why the best way to stop diseases of our time is prevention, through food and other lifestyle changes. Medicine (such as prescription pills and surgeries) is often only a Band-Aid  on the problem, not a long-term solution. Most common diseases of our time include obesity, type 2 diabetes, hypertension, heart disease, and cancer. The Pritikin Program is recommended and has been proven to help reduce, reverse, and/or prevent the damaging effects of metabolic syndrome.  Nutrition   Overview of the Pritikin Eating Plan  Clinical staff conducted group or individual video education with verbal and written material and guidebook.  Patient learns about the [place] for disease risk reduction. The [place] emphasizes a wide variety of unrefined, minimally-processed carbohydrates, like fruits, vegetables, whole grains, and legumes. Go, Caution, and Stop food choices are explained. Plant-based and lean animal proteins are emphasized. Rationale provided for low sodium intake for blood pressure control, low added sugars for blood sugar stabilization, and low added fats and oils for  coronary artery disease risk reduction and weight management.  Calorie Density  Clinical staff conducted group or individual video education with verbal and written material and guidebook.  Patient learns about calorie density and how it impacts the Pritikin Eating Plan. Knowing the characteristics of the food you choose will help you decide whether those foods will lead to weight gain or weight loss, and whether you want to consume more or less of them. Weight loss is usually a side effect of the Pritikin Eating Plan because of its focus on low calorie-dense foods.  Label Reading  Clinical staff conducted group or individual video education with verbal and written material and guidebook.  Patient learns about the Pritikin recommended label reading guidelines and corresponding recommendations regarding calorie density, added sugars, sodium content, and whole grains.  Dining Out - Part 1  Clinical staff conducted group or individual video education with verbal and written material and guidebook.  Patient learns that restaurant meals can be sabotaging because they can be so high in calories, fat, sodium, and/or sugar. Patient learns recommended strategies on how to positively address this and avoid unhealthy pitfalls.  Facts on Fats  Clinical staff conducted group or individual video education with verbal and written material and guidebook.  Patient learns that lifestyle modifications can be just as effective, if not more so, as many medications for lowering your risk of heart disease. A Pritikin lifestyle can help to reduce your risk of inflammation and atherosclerosis (cholesterol build-up, or plaque, in the artery walls). Lifestyle interventions such as dietary choices and physical activity address the cause of atherosclerosis. A review of the types of fats and their impact on blood cholesterol levels, along with dietary recommendations to reduce fat intake is also included.  Nutrition Action Plan   Clinical staff conducted group or individual video education with verbal and written material and guidebook.  Patient learns how to incorporate Pritikin recommendations into their lifestyle. Recommendations include planning and keeping personal health goals in mind as an important part of their success.  Healthy Mind-Set    Healthy Minds, Bodies, Hearts  Clinical staff conducted group or individual video education with verbal and written material and guidebook.  Patient learns how to identify when they are stressed. Video will discuss the impact of that stress, as well as the many benefits of stress management. Patient will also be introduced to stress management techniques. The way we think, act, and feel has an impact on our hearts.  How Our Thoughts Can Heal Our Hearts  Clinical staff conducted group or individual video education with verbal and written material and guidebook.  Patient learns that negative thoughts can cause depression and anxiety. This  can result in negative lifestyle behavior and serious health problems. Cognitive behavioral therapy is an effective method to help control our thoughts in order to change and improve our emotional outlook.  Additional Videos:  Exercise    Improving Performance  Clinical staff conducted group or individual video education with verbal and written material and guidebook.  Patient learns to use a non-linear approach by alternating intensity levels and lengths of time spent exercising to help burn more calories and lose more body fat. Cardiovascular exercise helps improve heart health, metabolism, hormonal balance, blood sugar control, and recovery from fatigue. Resistance training improves strength, endurance, balance, coordination, reaction time, metabolism, and muscle mass. Flexibility exercise improves circulation, posture, and balance. Seek guidance from your physician and exercise physiologist before implementing an exercise routine and learn  your capabilities and proper form for all exercise.  Introduction to Yoga  Clinical staff conducted group or individual video education with verbal and written material and guidebook.  Patient learns about yoga, a discipline of the coming together of mind, breath, and body. The benefits of yoga include improved flexibility, improved range of motion, better posture and core strength, increased lung function, weight loss, and positive self-image. Yoga's heart health benefits include lowered blood pressure, healthier heart rate, decreased cholesterol and triglyceride levels, improved immune function, and reduced stress. Seek guidance from your physician and exercise physiologist before implementing an exercise routine and learn your capabilities and proper form for all exercise.  Medical   Aging: Enhancing Your Quality of Life  Clinical staff conducted group or individual video education with verbal and written material and guidebook.  Patient learns key strategies and recommendations to stay in good physical health and enhance quality of life, such as prevention strategies, having an advocate, securing a [place], and keeping a list of medications and system for tracking them. It also discusses how to avoid risk for bone loss.  Biology of Weight Control  Clinical staff conducted group or individual video education with verbal and written material and guidebook.  Patient learns that weight gain occurs because we consume more calories than we burn (eating more, moving less). Even if your body weight is normal, you may have higher ratios of fat compared to muscle mass. Too much body fat puts you at increased risk for cardiovascular disease, heart attack, stroke, type 2 diabetes, and obesity-related cancers. In addition to exercise, following the [place] can help reduce your risk.  Decoding Lab Results  Clinical staff conducted group or individual video education  with verbal and written material and guidebook.  Patient learns that lab test reflects one measurement whose values change over time and are influenced by many factors, including medication, stress, sleep, exercise, food, hydration, pre-existing medical conditions, and more. It is recommended to use the knowledge from this video to become more involved with your lab results and evaluate your numbers to speak with your doctor.   Diseases of Our Time - Overview  Clinical staff conducted group or individual video education with verbal and written material and guidebook.  Patient learns that according to the CDC, 50% to 70% of chronic diseases (such as obesity, type 2 diabetes, elevated lipids, hypertension, and heart disease) are avoidable through lifestyle improvements including healthier food choices, listening to satiety cues, and increased physical activity.  Sleep Disorders Clinical staff conducted group or individual video education with verbal and written material and guidebook.  Patient learns how good quality and duration of sleep are  important to overall health and well-being. Patient also learns about sleep disorders and how they impact health along with recommendations to address them, including discussing with a physician.  Nutrition  Dining Out - Part 2 Clinical staff conducted group or individual video education with verbal and written material and guidebook.  Patient learns how to plan ahead and communicate in order to maximize their dining experience in a healthy and nutritious manner. Included are recommended food choices based on the type of restaurant the patient is visiting.   Fueling a Best boy conducted group or individual video education with verbal and written material and guidebook.  There is a strong connection between our food choices and our health. Diseases like obesity and type 2 diabetes are very prevalent and are in large-part due to lifestyle  choices. The Pritikin Eating Plan provides plenty of food and hunger-curbing satisfaction. It is easy to follow, affordable, and helps reduce health risks.  Menu Workshop  Clinical staff conducted group or individual video education with verbal and written material and guidebook.  Patient learns that restaurant meals can sabotage health goals because they are often packed with calories, fat, sodium, and sugar. Recommendations include strategies to plan ahead and to communicate with the manager, chef, or server to help order a healthier meal.  Planning Your Eating Strategy  Clinical staff conducted group or individual video education with verbal and written material and guidebook.  Patient learns about the [place] and its benefit of reducing the risk of disease. The [place] does not focus on calories. Instead, it emphasizes high-quality, nutrient-rich foods. By knowing the characteristics of the foods, we choose, we can determine their calorie density and make informed decisions.  Targeting Your Nutrition Priorities  Clinical staff conducted group or individual video education with verbal and written material and guidebook.  Patient learns that lifestyle habits have a tremendous impact on disease risk and progression. This video provides eating and physical activity recommendations based on your personal health goals, such as reducing LDL cholesterol, losing weight, preventing or controlling type 2 diabetes, and reducing high blood pressure.  Vitamins and Minerals  Clinical staff conducted group or individual video education with verbal and written material and guidebook.  Patient learns different ways to obtain key vitamins and minerals, including through a recommended healthy diet. It is important to discuss all supplements you take with your doctor.   Healthy Mind-Set    Smoking Cessation  Clinical staff conducted group or individual video education with verbal and  written material and guidebook.  Patient learns that cigarette smoking and tobacco addiction pose a serious health risk which affects millions of people. Stopping smoking will significantly reduce the risk of heart disease, lung disease, and many forms of cancer. Recommended strategies for quitting are covered, including working with your doctor to develop a successful plan.  Culinary   Becoming a Financial trader conducted group or individual video education with verbal and written material and guidebook.  Patient learns that cooking at home can be healthy, cost-effective, quick, and puts them in control. Keys to cooking healthy recipes will include looking at your recipe, assessing your equipment needs, planning ahead, making it simple, choosing cost-effective seasonal ingredients, and limiting the use of added fats, salts, and sugars.  Cooking - Breakfast and Snacks  Clinical staff conducted group or individual video education with verbal and written material and guidebook.  Patient learns how important breakfast is to satiety and nutrition  through the entire day. Recommendations include key foods to eat during breakfast to help stabilize blood sugar levels and to prevent overeating at meals later in the day. Planning ahead is also a key component.  Cooking - Human resources officer conducted group or individual video education with verbal and written material and guidebook.  Patient learns eating strategies to improve overall health, including an approach to cook more at home. Recommendations include thinking of animal protein as a side on your plate rather than center stage and focusing instead on lower calorie dense options like vegetables, fruits, whole grains, and plant-based proteins, such as beans. Making sauces in large quantities to freeze for later and leaving the skin on your vegetables are also recommended to maximize your experience.  Cooking - Healthy Salads and  Dressing Clinical staff conducted group or individual video education with verbal and written material and guidebook.  Patient learns that vegetables, fruits, whole grains, and legumes are the foundations of the [place]. Recommendations include how to incorporate each of these in flavorful and healthy salads, and how to create homemade salad dressings. Proper handling of ingredients is also covered. Cooking - Soups and [employer] - Soups and Desserts Clinical staff conducted group or individual video education with verbal and written material and guidebook.  Patient learns that Pritikin soups and desserts make for easy, nutritious, and delicious snacks and meal components that are low in sodium, fat, sugar, and calorie density, while high in vitamins, minerals, and filling fiber. Recommendations include simple and healthy ideas for soups and desserts.   Overview     The Pritikin Solution Program Overview Clinical staff conducted group or individual video education with verbal and written material and guidebook.  Patient learns that the results of the [place] Program have been documented in more than 100 articles published in peer-reviewed journals, and the benefits include reducing risk factors for (and, in some cases, even reversing) high cholesterol, high blood pressure, type 2 diabetes, obesity, and more! An overview of the three key pillars of the Pritikin Program will be covered: eating well, doing regular exercise, and having a healthy mind-set.  WORKSHOPS  Exercise: Exercise Basics: Building Your Action Plan Clinical staff led group instruction and group discussion with PowerPoint presentation and patient guidebook. To enhance the learning environment the use of posters, models and videos may be added. At the conclusion of this workshop, patients will comprehend the difference between physical activity and exercise, as well as the benefits of incorporating both, into  their routine. Patients will understand the FITT (Frequency, Intensity, Time, and Type) principle and how to use it to build an exercise action plan. In addition, safety concerns and other considerations for exercise and cardiac rehab will be addressed by the presenter. The purpose of this lesson is to promote a comprehensive and effective weekly exercise routine in order to improve patients' overall level of fitness.   Managing Heart Disease: Your Path to a Healthier Heart Clinical staff led group instruction and group discussion with PowerPoint presentation and patient guidebook. To enhance the learning environment the use of posters, models and videos may be added.At the conclusion of this workshop, patients will understand the anatomy and physiology of the heart. Additionally, they will understand how Pritikin's three pillars impact the risk factors, the progression, and the management of heart disease.  The purpose of this lesson is to provide a high-level overview of the heart, heart disease, and how the Pritikin lifestyle positively impacts  risk factors.  Exercise Biomechanics Clinical staff led group instruction and group discussion with PowerPoint presentation and patient guidebook. To enhance the learning environment the use of posters, models and videos may be added. Patients will learn how the structural parts of their bodies function and how these functions impact their daily activities, movement, and exercise. Patients will learn how to promote a neutral spine, learn how to manage pain, and identify ways to improve their physical movement in order to promote healthy living. The purpose of this lesson is to expose patients to common physical limitations that impact physical activity. Participants will learn practical ways to adapt and manage aches and pains, and to minimize their effect on regular exercise. Patients will learn how to maintain good posture while sitting, walking, and  lifting.  Balance Training and Fall Prevention  Clinical staff led group instruction and group discussion with PowerPoint presentation and patient guidebook. To enhance the learning environment the use of posters, models and videos may be added. At the conclusion of this workshop, patients will understand the importance of their sensorimotor skills (vision, proprioception, and the vestibular system) in maintaining their ability to balance as they age. Patients will apply a variety of balancing exercises that are appropriate for their current level of function. Patients will understand the common causes for poor balance, possible solutions to these problems, and ways to modify their physical environment in order to minimize their fall risk. The purpose of this lesson is to teach patients about the importance of maintaining balance as they age and ways to minimize their risk of falling.  WORKSHOPS   Nutrition:  Fueling a Scientist, research (physical sciences) led group instruction and group discussion with PowerPoint presentation and patient guidebook. To enhance the learning environment the use of posters, models and videos may be added. Patients will review the foundational principles of the [place] and understand what constitutes a serving size in each of the food groups. Patients will also learn Pritikin-friendly foods that are better choices when away from home and review make-ahead meal and snack options. Calorie density will be reviewed and applied to three nutrition priorities: weight maintenance, weight loss, and weight gain. The purpose of this lesson is to reinforce (in a group setting) the key concepts around what patients are recommended to eat and how to apply these guidelines when away from home by planning and selecting Pritikin-friendly options. Patients will understand how calorie density may be adjusted for different weight management goals.  Mindful Eating  Clinical staff led  group instruction and group discussion with PowerPoint presentation and patient guidebook. To enhance the learning environment the use of posters, models and videos may be added. Patients will briefly review the concepts of the [place] and the importance of low-calorie dense foods. The concept of mindful eating will be introduced as well as the importance of paying attention to internal hunger signals. Triggers for non-hunger eating and techniques for dealing with triggers will be explored. The purpose of this lesson is to provide patients with the opportunity to review the basic principles of the [place], discuss the value of eating mindfully and how to measure internal cues of hunger and fullness using the Hunger Scale. Patients will also discuss reasons for non-hunger eating and learn strategies to use for controlling emotional eating.  Targeting Your Nutrition Priorities Clinical staff led group instruction and group discussion with PowerPoint presentation and patient guidebook. To enhance the learning environment the use of posters, models and  videos may be added. Patients will learn how to determine their genetic susceptibility to disease by reviewing their family history. Patients will gain insight into the importance of diet as part of an overall healthy lifestyle in mitigating the impact of genetics and other environmental insults. The purpose of this lesson is to provide patients with the opportunity to assess their personal nutrition priorities by looking at their family history, their own health history and current risk factors. Patients will also be able to discuss ways of prioritizing and modifying the [place] for their highest risk areas  Menu  Clinical staff led group instruction and group discussion with PowerPoint presentation and patient guidebook. To enhance the learning environment the use of posters, models and videos may be added. Using menus  brought in from [employer], or printed from [employer], patients will apply the [place] dining out guidelines that were presented in the [employer] video. Patients will also be able to practice these guidelines in a variety of provided scenarios. The purpose of this lesson is to provide patients with the opportunity to practice hands-on learning of the [place] with actual menus and practice scenarios.  Label Reading Clinical staff led group instruction and group discussion with PowerPoint presentation and patient guidebook. To enhance the learning environment the use of posters, models and videos may be added. Patients will review and discuss the Pritikin label reading guidelines presented in Pritikin's Label Reading Educational series video. Using fool labels brought in from local grocery stores and markets, patients will apply the label reading guidelines and determine if the packaged food meet the Pritikin guidelines. The purpose of this lesson is to provide patients with the opportunity to review, discuss, and practice hands-on learning of the Pritikin Label Reading guidelines with actual packaged food labels. [place] Workshops are designed to teach patients ways to prepare quick, simple, and affordable recipes at home. The importance of nutrition's role in chronic disease risk reduction is reflected in its emphasis in the overall Pritikin program. By learning how to prepare essential core Pritikin Eating Plan recipes, patients will increase control over what they eat; be able to customize the flavor of foods without the use of added salt, sugar, or fat; and improve the quality of the food they consume. By learning a set of core recipes which are easily assembled, quickly prepared, and affordable, patients are more likely to prepare more healthy foods at home. These workshops focus on convenient breakfasts, simple  entres, side dishes, and desserts which can be prepared with minimal effort and are consistent with nutrition recommendations for cardiovascular risk reduction. Cooking [employer] are taught by a Engineer, materials (RD) who has been trained by the [employer]. The chef or RD has a clear understanding of the importance of minimizing - if not completely eliminating - added fat, sugar, and sodium in recipes. Throughout the series of [place] Workshop sessions, patients will learn about healthy ingredients and efficient methods of cooking to build confidence in their capability to prepare    Cooking School weekly topics:  Adding Flavor- Sodium-Free  Fast and Healthy Breakfasts  Powerhouse Plant-Based Proteins  Satisfying Salads and Dressings  Simple Sides and Sauces  International Cuisine-Spotlight on the [place] Zones  Delicious Desserts  Savory Soups  [employer] - Meals in a Agricultural consultant Appetizers and Snacks  Comforting Weekend Breakfasts  One-Pot Wonders   Fast [employer]  Easy Entertaining  Personalizing Your Pritikin Plate  WORKSHOPS   Healthy Mindset (Psychosocial): New Thoughts, New Behaviors Clinical staff led group instruction and group discussion with PowerPoint presentation and patient guidebook. To enhance the learning environment the use of posters, models and videos may be added. Patients will learn and practice techniques for developing effective health and lifestyle goals. Patients will be able to effectively apply the goal setting process learned to develop at least one new personal goal.  The purpose of this lesson is to expose patients to a new skill set of behavior modification techniques such as techniques setting SMART goals, overcoming barriers, and achieving new thoughts and new behaviors.  Managing Moods and Relationships Clinical staff led group instruction and group discussion with PowerPoint presentation and patient  guidebook. To enhance the learning environment the use of posters, models and videos may be added. Patients will learn how emotional and chronic stress factors can impact their health and relationships. They will learn healthy ways to manage their moods and utilize positive coping mechanisms. In addition, ICR patients will learn ways to improve communication skills. The purpose of this lesson is to expose patients to ways of understanding how one's mood and health are intimately connected. Developing a healthy outlook can help build positive relationships and connections with others. Patients will understand the importance of utilizing effective communication skills that include actively listening and being heard. They will learn and understand the importance of the "4 Cs" and especially Connections in fostering of a Healthy Mind-Set.  Healthy Sleep for a Healthy Heart Clinical staff led group instruction and group discussion with PowerPoint presentation and patient guidebook. To enhance the learning environment the use of posters, models and videos may be added. At the conclusion of this workshop, patients will be able to demonstrate knowledge of the importance of sleep to overall health, well-being, and quality of life. They will understand the symptoms of, and treatments for, common sleep disorders. Patients will also be able to identify daytime and nighttime behaviors which impact sleep, and they will be able to apply these tools to help manage sleep-related challenges. The purpose of this lesson is to provide patients with a general overview of sleep and outline the importance of quality sleep. Patients will learn about a few of the most common sleep disorders. Patients will also be introduced to the concept of "sleep hygiene," and discover ways to self-manage certain sleeping problems through simple daily behavior changes. Finally, the workshop will motivate patients by clarifying the links between quality  sleep and their goals of heart-healthy living.   Recognizing and Reducing Stress Clinical staff led group instruction and group discussion with PowerPoint presentation and patient guidebook. To enhance the learning environment the use of posters, models and videos may be added. At the conclusion of this workshop, patients will be able to understand the types of stress reactions, differentiate between acute and chronic stress, and recognize the impact that chronic stress has on their health. They will also be able to apply different coping mechanisms, such as reframing negative self-talk. Patients will have the opportunity to practice a variety of stress management techniques, such as deep abdominal breathing, progressive muscle relaxation, and/or guided imagery.  The purpose of this lesson is to educate patients on the role of stress in their lives and to provide healthy techniques for coping with it.  Learning Barriers/Preferences:  Learning Barriers/Preferences - 09/22/22 1025       Learning Barriers/Preferences   Learning Barriers None  Learning Preferences Audio;Written Material;Computer/Internet;Group Instruction;Individual Instruction;Pictoral;Skilled Demonstration;Verbal Instruction;Video             Education Topics:  Knowledge Questionnaire Score:  Knowledge Questionnaire Score - 09/22/22 1022       Knowledge Questionnaire Score   Pre Score 26/28             Core Components/Risk Factors/Patient Goals at Admission:  Personal Goals and Risk Factors at Admission - 09/22/22 1019       Core Components/Risk Factors/Patient Goals on Admission    Weight Management Yes;Weight Maintenance    Intervention Weight Management: Develop a combined nutrition and exercise program designed to reach desired caloric intake, while maintaining appropriate intake of nutrient and fiber, sodium and fats, and appropriate energy expenditure required for the weight goal.;Weight Management:  Provide education and appropriate resources to help participant work on and attain dietary goals.    Expected Outcomes Long Term: Adherence to nutrition and physical activity/exercise program aimed toward attainment of established weight goal;Weight Maintenance: Understanding of the daily nutrition guidelines, which includes 25-35% calories from fat, 7% or less cal from saturated fats, less than '200mg'$  cholesterol, less than 1.5gm of sodium, & 5 or more servings of fruits and vegetables daily    Tobacco Cessation Yes    Intervention Assist the participant in steps to quit. Provide individualized education and counseling about committing to Tobacco Cessation, relapse prevention, and pharmacological support that can be provided by physician.;Advice worker, assist with locating and accessing local/national Quit Smoking programs, and support quit date choice.    Expected Outcomes Short Term: Will demonstrate readiness to quit, by selecting a quit date.;Short Term: Will quit all tobacco product use, adhering to prevention of relapse plan.;Long Term: Complete abstinence from all tobacco products for at least 12 months from quit date.    Lipids Yes    Intervention Provide education and support for participant on nutrition & aerobic/resistive exercise along with prescribed medications to achieve LDL '70mg'$ , HDL >'40mg'$ .    Expected Outcomes Short Term: Participant states understanding of desired cholesterol values and is compliant with medications prescribed. Participant is following exercise prescription and nutrition guidelines.;Long Term: Cholesterol controlled with medications as prescribed, with individualized exercise RX and with personalized nutrition plan. Value goals: LDL < '70mg'$ , HDL > 40 mg.    Stress Yes    Intervention Offer individual and/or small group education and counseling on adjustment to heart disease, stress management and health-related lifestyle change. Teach and support self-help  strategies.;Refer participants experiencing significant psychosocial distress to appropriate mental health specialists for further evaluation and treatment. When possible, include family members and significant others in education/counseling sessions.    Expected Outcomes Short Term: Participant demonstrates changes in health-related behavior, relaxation and other stress management skills, ability to obtain effective social support, and compliance with psychotropic medications if prescribed.;Long Term: Emotional wellbeing is indicated by absence of clinically significant psychosocial distress or social isolation.             Core Components/Risk Factors/Patient Goals Review:    Core Components/Risk Factors/Patient Goals at Discharge (Final Review):    ITP Comments:  ITP Comments     Row Name 09/22/22 0816           ITP Comments Fransico Him, MD; Medical Director.  Introduction to Pritikin Education Program/Intensive Cardiac Rehab.  Initial Orientation packet reviewed with patient.                Comments: Participant attended orientation for the cardiac rehabilitation program on  09/22/2022  to perform initial intake and exercise walk test. Patient introduced to the [place] education and orientation packet was reviewed. Completed 6-minute walk test, measurements, initial ITP, and exercise prescription. Vital signs stable. Telemetry-normal sinus rhythm, asymptomatic.Harrell Gave RN BSN   Service time was from [id number] to [id number].

## 2022-09-22 NOTE — Progress Notes (Signed)
Cardiac Rehab Medication Review by a Nurse  Does the patient  feel that his/her medications are working for him/her?  yes  Has the patient been experiencing any side effects to the medications prescribed?  no  Does the patient measure his/her own blood pressure or blood glucose at home?  no   Does the patient have any problems obtaining medications due to transportation or finances?   no  Understanding of regimen: good Understanding of indications: good Potential of compliance: good    Nurse comments: [name] is taking her medications as prescribed and has a good understanding of what her medications are for. [name] has a blood pressure cuff at home but does not take her blood pressure on a daily basis. [name] says she feels better now that she has stopped taking the statin.    Christa See Anil Havard RN 09/22/2022 8:50 AM

## 2022-09-23 ENCOUNTER — Ambulatory Visit (HOSPITAL_COMMUNITY): Payer: BC Managed Care – PPO

## 2022-09-24 ENCOUNTER — Telehealth: Payer: Self-pay | Admitting: Pharmacist

## 2022-09-24 ENCOUNTER — Ambulatory Visit: Payer: BC Managed Care – PPO | Attending: Cardiology | Admitting: Pharmacist

## 2022-09-24 DIAGNOSIS — R7989 Other specified abnormal findings of blood chemistry: Secondary | ICD-10-CM | POA: Diagnosis not present

## 2022-09-24 DIAGNOSIS — I2111 ST elevation (STEMI) myocardial infarction involving right coronary artery: Secondary | ICD-10-CM

## 2022-09-24 DIAGNOSIS — E785 Hyperlipidemia, unspecified: Secondary | ICD-10-CM | POA: Diagnosis not present

## 2022-09-24 LAB — HEPATIC FUNCTION PANEL
ALT: 17 IU/L (ref 0–32)
AST: 20 IU/L (ref 0–40)
Albumin: 4.2 g/dL (ref 3.8–4.9)
Alkaline Phosphatase: 111 IU/L (ref 44–121)
Bilirubin Total: 0.3 mg/dL (ref 0.0–1.2)
Bilirubin, Direct: <0.1 mg/dL (ref 0.00–0.40)
Total Protein: 7.4 g/dL (ref 6.0–8.5)

## 2022-09-24 MED ORDER — REPATHA SURECLICK 140 MG/ML ~~LOC~~ SOAJ: 1.0000 mL | SUBCUTANEOUS | 3 refills | Status: DC

## 2022-09-24 NOTE — Telephone Encounter (Signed)
Patient approved for Repatha Pt made aware. Rx send and labs ordered and scheduled.

## 2022-09-24 NOTE — Assessment & Plan Note (Signed)
Assessment: LDL-C at time of MI significantly elevated at 184 ALT as high as 449 AST 284 on atorvastatin 80 mg daily and ezetimibe 10 mg daily Do not feel its appropriate to rechallenge with the statin Patient has quit smoking, I congratulated her on this achievement Encouraged continued exercise.  She is going to complete cardiac rehab Reviewed injection technique of PCSK9 inhibitors and cardiovascular risk reduction data  Plan: Will submit prior authorization for Repatha Patient given information on how to get a co-pay card Will contact patient and order labs once I hear back from the insurance Rechecking LFTs today to make sure they are still trending downwards

## 2022-09-24 NOTE — Progress Notes (Signed)
Patient ID: [name]                 DOB: [birth year]                    MRN: [id number]      HPI: [name] is a 57 y.o. female patient referred to lipid clinic by Dr. Tamala Julian. PMH is significant for  CAD s/p STEMI (10/23) treated with PCI/DES to RCA, HLD, tobacco abuse, prediabetes, history of DVT, and anemia.  She she experienced transaminase on atorvastatin and Zetia.  ALT as high as 449 AST 284.  Both medications were stopped patient felt better and LFTs began to improve.  LDL-C at time of MI was 184.  Had come down to 49 on atorvastatin 80 and ezetimibe 10 mg daily.  But unfortunately had to be stopped due to above.  Patient referred to lipid clinic to discuss PCSK9.  Patient presents to the clinic, she is feeling much better.  Her LFTs were trending down, last time they were checked was about a month ago.  She has quit smoking completely.  She is a part-time Surveyor, [employer].  Reviewed options for lowering LDL cholesterol PCSK-9 inhibitor and inclisiran.  Discussed mechanisms of action, dosing, side effects and potential decreases in LDL cholesterol.  Also reviewed cost information and potential options for patient assistance.   Current Medications: none Intolerances: Atorvastatin and ezetimibe (substantial increase in LFTs) Risk Factors: STEMI, premature CAD LDL-C goal: <55 ApoB goal: <70 Non-HDL C goal: <85  Diet: Not discussed in detail  Exercise: walks, walks stairs at home for exercise  Family History:  Family History  Problem Relation Age of Onset   Diabetes Mother    Depression Mother    Stroke Mother    Diabetes Maternal Grandmother    Heart disease Maternal Grandfather    Tuberculosis Paternal Grandfather     Social History: no tobacco after heart attack, no ETOH  Lipid Panel: Total cholesterol 257, triglycerides 57, HDL-C 62, LDL-C 184, non-HDL C 195  LFT0 Result Notes     1 Follow-up Encounter           Component Ref Range & Units 1 mo  ago (08/12/22) 1 mo ago (08/08/22) 1 mo ago (08/06/22) 3 mo ago (06/24/22) 2 yr ago (07/29/20) 4 yr ago (05/05/18) 6 yr ago (02/26/16)  Total Bilirubin 0.2 - 1.2 mg/dL 0.6 0.8 R 0.7 R 0.5 R 0.4 0.5 0.7 R  Bilirubin, Direct 0.0 - 0.3 mg/dL 0.2        Alkaline Phosphatase 39 - 117 U/L 632 High  590 High  R 659 High  R 62 R 68 55 40 R  AST 0 - 37 U/L 50 High  183 High  R 284 High  R 20 R '13 16 17 '$ R  ALT 0 - 35 U/L 123 High  302 High  R 449 High  R 13 R '9 9 10 '$ Low  R  Total Protein 6.0 - 8.3 g/dL 8.0 8.4 High  R 7.9 R 7.6 R 7.5 7.9 7.7 R  Albumin 3.5 - 5.2 g/dL 3.8 3.1 Low  R 4.2 R 3.7 R 4.2 4.3 3.9 R  Resulting Agency  [facility] [place] [facility] [place] [facility]         Specimen Collected: 08/12/22 10:28 Last Resulted: 08/12/22 16:37         Past Medical History:  Diagnosis Date  Acute ST elevation myocardial infarction (STEMI) involving right coronary artery ([place]) 06/24/2022   100% dRCA => DES PCI =.> Synergy XD DES 2.75 x 16-3.0 mm postdilation), reduced to 0%.   Anemia    Chicken pox    Coronary artery disease involving native coronary artery of native heart with unstable angina pectoris ([place]) 06/24/2022   Inf STEMI =>Cardiac Cath-PCI 06/24/2022: RIGHT DOMINANT.  CULPRIT LESION-distal RCA 100% (left to right collaterals) => DES PCI (Synergy XD DES 2.75 x 16-3.0 mm postdilation), reduced to 0%.  60% proximal RCA.  Diffuse 35% proximal LAD.  Normal LCx.   Left leg DVT ([place]) 2014   Uterine leiomyoma     Current Outpatient Medications on File Prior to Visit  Medication Sig Dispense Refill   aspirin EC 81 MG tablet Take 1 tablet (81 mg total) by mouth daily. Swallow whole. 30 tablet 3   melatonin 5 MG TABS Take 10 mg by mouth at bedtime.     Multiple Vitamins-Minerals (ONE-A-DAY FOR HER VITACRAVES) CHEW Chew 1 tablet by mouth in the morning.     nitroGLYCERIN (NITROSTAT) 0.4 MG SL tablet Place 1 tablet (0.4 mg total) under the tongue  every 5 (five) minutes as needed. 25 tablet 2   ticagrelor (BRILINTA) 90 MG TABS tablet Take 1 tablet (90 mg total) by mouth 2 (two) times daily. 60 tablet 11   No current facility-administered medications on file prior to visit.    Allergies  Allergen Reactions   Lipitor [Atorvastatin] Other (See Comments)    Elevated liver functions    Assessment/Plan:  1. Hyperlipidemia -  Hyperlipidemia with target LDL less than 70; > familial hyperlipidemia Assessment: LDL-C at time of MI significantly elevated at 184 ALT as high as 449 AST 284 on atorvastatin 80 mg daily and ezetimibe 10 mg daily Do not feel its appropriate to rechallenge with the statin Patient has quit smoking, I congratulated her on this achievement Encouraged continued exercise.  She is going to complete cardiac rehab Reviewed injection technique of PCSK9 inhibitors and cardiovascular risk reduction data  Plan: Will submit prior authorization for Repatha Patient given information on how to get a co-pay card Will contact patient and order labs once I hear back from the insurance Rechecking LFTs today to make sure they are still trending downwards    Thank you,  Ramond Dial, Pharm.D, BCPS, CPP [facility] [place] [address], [place], [place] [address]  Phone: [phone]; Fax: [phone]

## 2022-09-24 NOTE — Patient Instructions (Signed)
I will submit a prior authorization for Repatha. I will call you once I hear back. Please call me at [phone] with any questions.   Repatha is a cholesterol medication that improved your body's ability to get rid of "bad cholesterol" known as LDL. It can lower your LDL up to 60%! It is an injection that is given under the skin every 2 weeks. The medication often requires a prior authorization from your insurance company. We will take care of submitting all the necessary information to your insurance company to get it approved. The most common side effects of Repatha include runny nose, symptoms of the common cold, rarely flu or flu-like symptoms, back/muscle pain in about 3-4% of the patients, and redness, pain, or bruising at the injection site. Tell your healthcare provider if you have any side effect that bothers you or that does not go away.     To get your $5 Repatha copay card; [email or web address] Paying for Repatha (white bar across top).  Scroll down to "options for insurance situations" and click on "I have commercial or private insurance" - scroll down and click on the blue highlighted  "click here" to learn more about the [place] you have a prescription - Click YES then scroll down and mark the box "Repatha Copay Card", then continue to scroll down and enter the personal information. There are two blue boxes with "I agree" next to them. The first is optional if you want to enroll in their patient support program.  This one is voluntary.  The second is their patient authorization, and you must mark this one to continue.   Lastly they ask if they can contact you regarding information about market research about the drug or disease state.  You can mark either box. Click "next" and continue to follow steps to get your copay card

## 2022-09-25 ENCOUNTER — Ambulatory Visit (HOSPITAL_COMMUNITY): Payer: BC Managed Care – PPO

## 2022-09-28 ENCOUNTER — Other Ambulatory Visit: Payer: Self-pay | Admitting: Family Medicine

## 2022-09-28 ENCOUNTER — Ambulatory Visit (HOSPITAL_COMMUNITY): Payer: BC Managed Care – PPO

## 2022-09-28 ENCOUNTER — Encounter: Payer: Self-pay | Admitting: Family Medicine

## 2022-09-28 ENCOUNTER — Ambulatory Visit: Payer: BC Managed Care – PPO | Admitting: Family Medicine

## 2022-09-28 ENCOUNTER — Encounter (HOSPITAL_COMMUNITY): Payer: BC Managed Care – PPO

## 2022-09-28 ENCOUNTER — Telehealth (HOSPITAL_COMMUNITY): Payer: Self-pay | Admitting: *Deleted

## 2022-09-28 ENCOUNTER — Ambulatory Visit (HOSPITAL_BASED_OUTPATIENT_CLINIC_OR_DEPARTMENT_OTHER)
Admission: RE | Admit: 2022-09-28 | Discharge: 2022-09-28 | Disposition: A | Discharge status: Home / Self Care | Payer: BC Managed Care – PPO | Source: Ambulatory Visit | Attending: Family Medicine | Admitting: Family Medicine

## 2022-09-28 VITALS — BP 100/70 | HR 78 | Temp 98.4°F | Resp 18 | Ht 68.75 in | Wt 142.4 lb

## 2022-09-28 DIAGNOSIS — M79661 Pain in right lower leg: Secondary | ICD-10-CM | POA: Diagnosis not present

## 2022-09-28 NOTE — Telephone Encounter (Signed)
[name] reported

## 2022-09-28 NOTE — Patient Instructions (Signed)
Deep Vein Thrombosis  Deep vein thrombosis (DVT) is a condition in which a blood clot forms in a vein of the deep venous system. This can occur in the lower leg, thigh, pelvis, arm, or neck. A clot is blood that has thickened into a gel or solid. This condition is serious and can be life-threatening if the clot travels to the arteries of the lungs and causes a blockage (pulmonary embolism). A DVT can also damage veins in the leg, which can lead to long-term venous disease, leg pain, swelling, discoloration, and ulcers or sores (post-thrombotic syndrome). What are the causes? This condition may be caused by: A slowdown of blood flow. Damage to a vein. A condition that causes blood to clot more easily, such as certain bleeding disorders. What increases the risk? The following factors may make you more likely to develop this condition: Obesity. Being older, especially older than age 3. Being inactive or not moving around (sedentary lifestyle). This may include: Sitting or lying down for longer than 4-6 hours other than to sleep at night. Being in the hospital, or having major or lengthy surgery. Having any recent bone injuries, such as breaks (fractures), that reduce movement, especially in the lower extremities. Having recent orthopedic surgery on the lower extremities. Being pregnant, giving birth, or having recently given birth. Taking medicines that contain estrogen, such as birth control or hormone replacement therapy. Using products that contain nicotine or tobacco, especially if you use hormonal birth control. Having a history of a blood vessel disease (peripheral vascular disease) or congestive heart disease. Having a history of cancer, especially if being treated with chemotherapy. What are the signs or symptoms? Symptoms of this condition include: Swelling, pain, pressure, or tenderness in an arm or a leg. An arm or a leg becoming warm, red, or discolored. A leg turning very pale or  blue. You may have a large DVT. This is rare. If the clot is in your leg, you may notice that symptoms get worse when you stand or walk. In some cases, there are no symptoms. How is this diagnosed? This condition is diagnosed with: Your medical history and a physical exam. Tests, such as: Blood tests to check how well your blood clots. Doppler ultrasound. This is the best way to find a DVT. CT venogram. Contrast dye is injected into a vein, and X-rays are taken to check for clots. This is helpful for veins in the chest or pelvis. How is this treated? Treatment for this condition depends on: The cause of your DVT. The size and location of your DVT, or having more than one DVT. Your risk for bleeding or developing more clots. Other medical conditions you may have. Treatment may include: Taking a blood thinner medicine (anticoagulant) to prevent more clots from forming or current clots from growing. Wearing compression stockings. Injecting medicines into the affected vein to break up the clot (catheter-directed thrombolysis). Surgical procedures, when DVT is severe or hard to treat. These may be done to: Isolate and remove your clot. Place an inferior vena cava (IVC) filter. This filter is placed into a large vein called the inferior vena cava to catch blood clots before they reach your lungs. You may get some medical treatments for 6 months or longer. Follow these instructions at home: If you are taking blood thinners: Talk with your health care provider before you take any medicines that contain aspirin or NSAIDs, such as ibuprofen. These medicines increase your risk for dangerous bleeding. Take your medicine exactly  as told, at the same time every day. Do not skip a dose. Do not take more than the prescribed dose. This is important. Ask your health care provider about foods and medicines that could change or interact with the way your blood thinner works. Avoid these foods and medicines  if you are told to do so. Avoid anything that may cause bleeding or bruising. You may bleed more easily while taking blood thinners. Be very careful when using knives, scissors, or other sharp objects. Use an electric razor instead of a blade. Avoid activities that could cause injury or bruising, and follow instructions for preventing falls. Tell your health care provider if you have had any internal bleeding, bleeding ulcers, or neurologic diseases, such as strokes or cerebral aneurysms. Wear a medical alert bracelet or carry a card that lists what medicines you take. General instructions Take over-the-counter and prescription medicines only as told by your health care provider. Return to your normal activities as told by your health care provider. Ask your health care provider what activities are safe for you. If recommended, wear compression stockings as told by your health care provider. These stockings help to prevent blood clots and reduce swelling in your legs. Never wear your compression stockings while sleeping at night. Keep all follow-up visits. This is important. Where to find more information American Heart Association: www.heart.org Centers for Disease Control and Prevention: http://www.wolf.info/ National Heart, Lung, and Blood Institute: https://wilson-eaton.com/ Contact a health care provider if: You miss a dose of your blood thinner. You have unusual bruising or other color changes. You have new or worse pain, swelling, or redness in an arm or a leg. You have worsening numbness or tingling in an arm or a leg. You have a significant color change (pale or blue) in the extremity that has the DVT. Get help right away if: You have signs or symptoms that a blood clot has moved to the lungs. These may include: Shortness of breath. Chest pain. Fast or irregular heartbeats (palpitations). Light-headedness, dizziness, or fainting. Coughing up blood. You have signs or symptoms that your blood is  too thin. These may include: Blood in your vomit, stool, or urine. A cut that will not stop bleeding. A menstrual period that is heavier than usual. A severe headache or confusion. These symptoms may be an emergency. Get help right away. Call 911. Do not wait to see if the symptoms will go away. Do not drive yourself to the hospital. Summary Deep vein thrombosis (DVT) happens when a blood clot forms in a deep vein. This may occur in the lower leg, thigh, pelvis, arm, or neck. Symptoms affect the arm or leg and can include swelling, pain, tenderness, warmth, redness, or discoloration. This condition may be treated with medicines. In severe cases, a procedure or surgery may be done to remove or dissolve the clots. If you are taking blood thinners, take them exactly as told. Do not skip a dose. Do not take more than is prescribed. Get help right away if you have a severe headache, shortness of breath, chest pain, fast or irregular heartbeats, or blood in your vomit, urine, or stool. This information is not intended to replace advice given to you by your health care provider. Make sure you discuss any questions you have with your health care provider. Document Revised: 03/17/2021 Document Reviewed: 03/17/2021 Elsevier Patient Education  [place].

## 2022-09-28 NOTE — Assessment & Plan Note (Signed)
Check [place]  Doubt dvt  Warm compresses

## 2022-09-28 NOTE — Progress Notes (Signed)
Subjective:   By signing my name below, I, [name], attest that this documentation has been prepared under the direction and in the presence of [name]. DO 09/28/2022   Patient ID: [name], female    DOB: [birth year], 57 y.o.   MRN: [id number]  Chief Complaint  Patient presents with   Leg Pain    Right lower leg, sxs started yesterday. Pt states some of the swelling has improved but still having pain.     HPI Patient is in today for sick visit. She presents with a hematoma and soreness due to a knot on her right lower calf. She reports that yesterday the knot was bigger, protruded more, and would move when she would gently rub on it. She wore leggings yesterday which she believes may have helped with compression.  She recently had a stent placed in October and is on Brilinta. She recently took her first Repatha shot. She also has a history of having a blood clot in her thigh in 2016.   Yesterday her eye was also red. She bought allergy eye drops to resolve.  She denies having any fever, new joint pain, new moles, congestion, sinus pain, sore throat, chest pain, palpitations, cough, SOB, wheezing, n/v/d, constipation, blood in stool, dysuria, frequency, hematuria, or headaches at this time.  Past Medical History:  Diagnosis Date   Acute ST elevation myocardial infarction (STEMI) involving right coronary artery ([place]) 06/24/2022   100% dRCA => DES PCI =.> Synergy XD DES 2.75 x 16-3.0 mm postdilation), reduced to 0%.   Anemia    Chicken pox    Coronary artery disease involving native coronary artery of native heart with unstable angina pectoris ([place]) 06/24/2022   Inf STEMI =>Cardiac Cath-PCI 06/24/2022: RIGHT DOMINANT.  CULPRIT LESION-distal RCA 100% (left to right collaterals) => DES PCI (Synergy XD DES 2.75 x 16-3.0 mm postdilation), reduced to 0%.  60% proximal RCA.  Diffuse 35% proximal LAD.  Normal LCx.   Left leg DVT [facility]) 2014   Uterine leiomyoma      Past Surgical History:  Procedure Laterality Date   BUNIONECTOMY Bilateral 09/07/2005   CARDIAC CATHETERIZATION     CESAREAN SECTION     x 2   CORONARY/GRAFT ACUTE MI REVASCULARIZATION N/A 06/24/2022   Procedure: Coronary/Graft Acute MI Revascularization;  Surgeon: Belva Crome, MD;  Location: [place] [facility];  Service: Cardiovascular;  Laterality: N/A;   ROBOTIC ASSISTED TOTAL HYSTERECTOMY WITH BILATERAL SALPINGO OOPHERECTOMY N/A 03/03/2016   Procedure: XI ROBOTIC ASSISTED TOTAL HYSTERECTOMY WITH BILATERAL Salipingectomy ;  Surgeon: Everitt Amber, MD;  Location: [facility];  Service: Gynecology;  Laterality: N/A;   TUBAL LIGATION      Family History  Problem Relation Age of Onset   Diabetes Mother    Depression Mother    Stroke Mother    Diabetes Maternal Grandmother    Heart disease Maternal Grandfather    Tuberculosis Paternal Grandfather     Social History   Socioeconomic History   Marital status: Married    Spouse name: [name]    Number of children: 2   Years of education: 12+   Highest education level: Not on file  Occupational History   Not on file  Tobacco Use   Smoking status: Former    Packs/day: 0.25    Years: 15.00    Total pack years: 3.75    Types: Cigarettes    Quit date: 2022    Years since quitting: 2.0  Smokeless tobacco: Never  Vaping Use   Vaping Use: Not on file  Substance and Sexual Activity   Alcohol use: No   Drug use: No   Sexual activity: Not on file  Other Topics Concern   Not on file  Social History Narrative   Patient lives at home with husband and 2 children. [name]   Patient works at [employer].    Patient has a college education.    Patient is right handed.    Social Determinants of Health   Financial Resource Strain: Not on file  Food Insecurity: No Food Insecurity (06/26/2022)   Hunger Vital Sign    Worried About Running Out of Food in the Last Year: Never true    Ran Out of Food in the Last Year: Never true   Transportation Needs: No Transportation Needs (06/26/2022)   PRAPARE - Hydrologist (Medical): No    Lack of Transportation (Non-Medical): No  Physical Activity: Not on file  Stress: Not on file  Social Connections: Not on file  Intimate Partner Violence: Not At Risk (06/26/2022)   Humiliation, Afraid, Rape, and Kick questionnaire    Fear of Current or Ex-Partner: No    Emotionally Abused: No    Physically Abused: No    Sexually Abused: No    Outpatient Medications Prior to Visit  Medication Sig Dispense Refill   aspirin EC 81 MG tablet Take 1 tablet (81 mg total) by mouth daily. Swallow whole. 30 tablet 3   Evolocumab (REPATHA SURECLICK) 476 MG/ML SOAJ Inject 140 mg into the skin every 14 (fourteen) days. 6 mL 3   melatonin 5 MG TABS Take 10 mg by mouth at bedtime.     Multiple Vitamins-Minerals (ONE-A-DAY FOR HER VITACRAVES) CHEW Chew 1 tablet by mouth in the morning.     nitroGLYCERIN (NITROSTAT) 0.4 MG SL tablet Place 1 tablet (0.4 mg total) under the tongue every 5 (five) minutes as needed. 25 tablet 2   ticagrelor (BRILINTA) 90 MG TABS tablet Take 1 tablet (90 mg total) by mouth 2 (two) times daily. 60 tablet 11   No facility-administered medications prior to visit.    Allergies  Allergen Reactions   Lipitor [Atorvastatin] Other (See Comments)    Elevated liver functions    Review of Systems  Constitutional:  Negative for chills, fever and malaise/fatigue.  HENT:  Negative for congestion and hearing loss.   Eyes:  Positive for redness (relieved with allergy eye drops). Negative for discharge.  Respiratory:  Negative for cough, sputum production and shortness of breath.   Cardiovascular:  Negative for chest pain, palpitations and leg swelling.  Gastrointestinal:  Negative for abdominal pain, blood in stool, constipation, diarrhea, heartburn, nausea and vomiting.  Genitourinary:  Negative for dysuria, frequency, hematuria and urgency.   Musculoskeletal:  Negative for back pain, falls and myalgias (right lower calf, at site of bruise and knot).       R calf pain ,   Skin:  Negative for rash.  Neurological:  Negative for dizziness, sensory change, loss of consciousness, weakness and headaches.  Endo/Heme/Allergies:  Negative for environmental allergies. Does not bruise/bleed easily.  Psychiatric/Behavioral:  Negative for depression and suicidal ideas. The patient is not nervous/anxious and does not have insomnia.        Objective:    Physical Exam Vitals and nursing note reviewed.  Constitutional:      General: She is not in acute distress.    Appearance: Normal appearance.  She is well-developed.  HENT:     Head: Normocephalic and atraumatic.     Right Ear: External ear normal.     Left Ear: External ear normal.  Eyes:     Extraocular Movements: Extraocular movements intact.     Conjunctiva/sclera: Conjunctivae normal.     Pupils: Pupils are equal, round, and reactive to light.  Neck:     Thyroid: No thyromegaly.     Vascular: No carotid bruit or JVD.  Cardiovascular:     Rate and Rhythm: Normal rate and regular rhythm.     Pulses: Normal pulses.     Heart sounds: Normal heart sounds. No murmur heard.    No gallop.  Pulmonary:     Effort: Pulmonary effort is normal. No respiratory distress.     Breath sounds: Normal breath sounds. No wheezing or rales.  Chest:     Chest wall: No tenderness.  Musculoskeletal:        General: Normal range of motion.     Cervical back: Normal range of motion and neck supple.     Comments: Hematoma R low calf, tender to touch + erythmema   Skin:    General: Skin is warm and dry.  Neurological:     Mental Status: She is alert and oriented to person, place, and time.  Psychiatric:        Mood and Affect: Mood normal.        Behavior: Behavior normal.        Judgment: Judgment normal.     BP 100/70 (BP Location: Left Arm, Patient Position: Sitting, Cuff Size: Normal)    Pulse 78   Temp 98.4 F (36.9 C) (Oral)   Resp 18   Ht 5' 8.75" (1.746 m)   Wt 142 lb 6.4 oz (64.6 kg)   LMP 03/01/2016   SpO2 98%   BMI 21.18 kg/m  Wt Readings from Last 3 Encounters:  09/28/22 142 lb 6.4 oz (64.6 kg)  09/22/22 145 lb 4.5 oz (65.9 kg)  08/12/22 135 lb (61.2 kg)    Diabetic Foot Exam - Simple   No data filed    Lab Results  Component Value Date   WBC 8.4 08/08/2022   HGB 11.9 (L) 08/08/2022   HCT 37.0 08/08/2022   PLT 435 (H) 08/08/2022   GLUCOSE 119 (H) 08/08/2022   CHOL 116 08/06/2022   TRIG 47 08/06/2022   HDL 56 08/06/2022   LDLCALC 49 08/06/2022   ALT 17 09/24/2022   AST 20 09/24/2022   NA 139 08/08/2022   K 3.9 08/08/2022   CL 104 08/08/2022   CREATININE 0.93 08/08/2022   BUN 7 08/08/2022   CO2 25 08/08/2022   TSH 1.34 08/12/2022   INR 1.1 06/24/2022   HGBA1C 6.1 (H) 06/24/2022    Lab Results  Component Value Date   TSH 1.34 08/12/2022   Lab Results  Component Value Date   WBC 8.4 08/08/2022   HGB 11.9 (L) 08/08/2022   HCT 37.0 08/08/2022   MCV 90.0 08/08/2022   PLT 435 (H) 08/08/2022   Lab Results  Component Value Date   NA 139 08/08/2022   K 3.9 08/08/2022   CO2 25 08/08/2022   GLUCOSE 119 (H) 08/08/2022   BUN 7 08/08/2022   CREATININE 0.93 08/08/2022   BILITOT 0.3 09/24/2022   ALKPHOS 111 09/24/2022   AST 20 09/24/2022   ALT 17 09/24/2022   PROT 7.4 09/24/2022   ALBUMIN 4.2 09/24/2022   CALCIUM 9.3  08/08/2022   ANIONGAP 10 08/08/2022   EGFR 79 08/06/2022   GFR 82.52 07/29/2020   Lab Results  Component Value Date   CHOL 116 08/06/2022   Lab Results  Component Value Date   HDL 56 08/06/2022   Lab Results  Component Value Date   LDLCALC 49 08/06/2022   Lab Results  Component Value Date   TRIG 47 08/06/2022   Lab Results  Component Value Date   CHOLHDL 2.1 08/06/2022   Lab Results  Component Value Date   HGBA1C 6.1 (H) 06/24/2022       Assessment & Plan:   Problem List Items Addressed This  Visit       Unprioritized   Right calf pain - Primary    Check [place]  Doubt dvt  Warm compresses       Relevant Orders   US Venous Img Lower Unilateral Left (DVT)   No orders of the defined types were placed in this encounter.  IAnn Held, DO, personally preformed the services described in this documentation.  All medical record entries made by the scribe were at my direction and in my presence.  I have reviewed the chart and discharge instructions (if applicable) and agree that the record reflects my personal performance and is accurate and complete. 09/28/2022  I,Rachel Rivera,acting as a scribe for Cynthia Held, DO.,have documented all relevant documentation on the behalf of Cynthia Held, DO,as directed by  Cynthia Held, DO while in the presence of [place], DO, have reviewed all documentation for this visit. The documentation on 09/28/22 for the exam, diagnosis, procedures, and orders are all accurate and complete.   Cynthia Held, DO

## 2022-09-28 NOTE — Telephone Encounter (Signed)
Spoke with [name] she noticed a lump on the back of her leg yesterday. [name] spoke with her Primary provider's office and has an appointment to be evaluated this morning. Will cancel appointment and await clearance for Dr Etter Sjogren for the patient to proceed with exercise this week.Harrell Gave RN BSN

## 2022-09-30 ENCOUNTER — Telehealth (HOSPITAL_COMMUNITY): Payer: Self-pay | Admitting: *Deleted

## 2022-09-30 ENCOUNTER — Ambulatory Visit (HOSPITAL_COMMUNITY): Payer: BC Managed Care – PPO

## 2022-09-30 ENCOUNTER — Encounter (HOSPITAL_COMMUNITY): Payer: BC Managed Care – PPO

## 2022-09-30 NOTE — Telephone Encounter (Signed)
-----  Message from [name], DO sent at 09/30/2022 11:51 AM EST ----- Regarding: RE: Exercise at cardiac rehab Yes. that would be fine.  Thank you ----- Message ----- From: Magda Kiel, RN Sent: 09/30/2022   8:28 AM EST To: [name], DO Subject: Exercise at cardiac rehab                      Good morning Dr Etter Sjogren  You saw Tillman Sers on Monday for right calf soreness. I saw that [name] had her ultrasound completed. Are you okay with [name] proceeding with exercise at cardiac rehab?  I appreciate your input!   Sincerely, Barnet Pall RN Cardiac Rehab

## 2022-09-30 NOTE — Telephone Encounter (Signed)
Spoke with [name] to let her know she has been cleared to begin exercise. [name] will start on Friday.Barnet Pall, RN,BSN 09/30/2022 12:28 PM

## 2022-10-02 ENCOUNTER — Ambulatory Visit (HOSPITAL_COMMUNITY): Payer: BC Managed Care – PPO

## 2022-10-02 ENCOUNTER — Encounter (HOSPITAL_COMMUNITY)
Admission: RE | Admit: 2022-10-02 | Discharge: 2022-10-02 | Disposition: A | Discharge status: Home / Self Care | Payer: BC Managed Care – PPO | Source: Ambulatory Visit | Attending: Interventional Cardiology | Admitting: Interventional Cardiology

## 2022-10-02 DIAGNOSIS — I252 Old myocardial infarction: Secondary | ICD-10-CM | POA: Diagnosis not present

## 2022-10-02 DIAGNOSIS — I2111 ST elevation (STEMI) myocardial infarction involving right coronary artery: Secondary | ICD-10-CM

## 2022-10-02 DIAGNOSIS — Z955 Presence of coronary angioplasty implant and graft: Secondary | ICD-10-CM

## 2022-10-02 NOTE — Progress Notes (Signed)
Daily Session Note  Patient Details  Name: [name] MRN: [id number] Date of Birth: 09/26/1965 Referring Provider:   Flowsheet Row INTENSIVE CARDIAC REHAB ORIENT from 09/22/2022 in [facility] [place]  Referring Provider Larae Grooms, MD       Encounter Date: 10/02/2022  Check In:  Session Check In - 10/02/22 0827       Check-In   Supervising physician immediately available to respond to emergencies [facility] MD immediately available    Physician(s) Nicholes Rough, PA-C    Location [facility]    Staff Present Lesly Rubenstein, MS, ACSM-CEP, CCRP, Exercise Physiologist;Tavares Levinson Venetia Maxon, RN, Rico Ala, MS, Exercise Physiologist;Olinty Celesta Aver, MS, ACSM-CEP, Exercise Physiologist    Virtual Visit No    Medication changes reported     No    Fall or balance concerns reported    No    Tobacco Cessation No Change    Warm-up and Cool-down Performed as group-led instruction    Resistance Training Performed Yes    VAD Patient? No    PAD/SET Patient? No      Pain Assessment   Currently in Pain? No/denies    Pain Score 0-No pain    Multiple Pain Sites No             Capillary Blood Glucose: No results found for this or any previous visit (from the past 24 hour(s)).   Exercise Prescription Changes - 10/02/22 1400       Response to Exercise   Blood Pressure (Admit) 104/60    Blood Pressure (Exercise) 124/78    Blood Pressure (Exit) 98/60    Heart Rate (Admit) 83 bpm    Heart Rate (Exercise) 97 bpm    Heart Rate (Exit) 83 bpm    Rating of Perceived Exertion (Exercise) 10    Symptoms None    Comments Pt's first day in the CRP2 program    Duration Continue with 30 min of aerobic exercise without signs/symptoms of physical distress.    Intensity THRR unchanged      Progression   Progression Continue to progress workloads to maintain intensity without signs/symptoms of physical distress.    Average METs  2.95      Resistance Training   Training Prescription Yes    Weight 3 lbs    Reps 10-15    Time 10 Minutes      Interval Training   Interval Training No      Arm Ergometer   Level 1.3    Watts 11    RPM 45    Minutes 15    METs 2.3      Recumbant Elliptical   Level 2    Watts 64    Minutes 15    METs 3.6             Social History   Tobacco Use  Smoking Status Former   Packs/day: 0.25   Years: 15.00   Total pack years: 3.75   Types: Cigarettes   Quit date: 2022   Years since quitting: 2.0  Smokeless Tobacco Never    Goals Met:  Exercise tolerated well No report of concerns or symptoms today Strength training completed today  Goals Unmet:  Not Applicable  Comments: Pt started cardiac rehab today.  Pt tolerated light exercise without difficulty. VSS, telemetry-Sinus Rhythm t wave inversion, asymptomatic.  Medication list reconciled. Pt denies barriers to medicaiton compliance.  PSYCHOSOCIAL ASSESSMENT:  PHQ-1. Pt exhibits positive  coping skills, hopeful outlook with supportive family. No psychosocial needs identified at this time, no psychosocial interventions necessary.    Pt enjoys spending time with her 2 daughters and volunteering at a non Facilities manager.   Pt oriented to exercise equipment and routine.  [name] returned to exercise per Dr Etter Sjogren and exercised without dififculty  Understanding verbalized. Harrell Gave RN BSN    Dr. Fransico Him is Medical Director for Cardiac Rehab at [facility].

## 2022-10-05 ENCOUNTER — Ambulatory Visit (HOSPITAL_COMMUNITY): Payer: BC Managed Care – PPO

## 2022-10-05 ENCOUNTER — Encounter (HOSPITAL_COMMUNITY)
Admission: RE | Admit: 2022-10-05 | Discharge: 2022-10-05 | Disposition: A | Discharge status: Home / Self Care | Payer: BC Managed Care – PPO | Source: Ambulatory Visit | Attending: Interventional Cardiology | Admitting: Interventional Cardiology

## 2022-10-05 DIAGNOSIS — I252 Old myocardial infarction: Secondary | ICD-10-CM | POA: Diagnosis not present

## 2022-10-05 DIAGNOSIS — I2111 ST elevation (STEMI) myocardial infarction involving right coronary artery: Secondary | ICD-10-CM

## 2022-10-05 DIAGNOSIS — Z955 Presence of coronary angioplasty implant and graft: Secondary | ICD-10-CM

## 2022-10-07 ENCOUNTER — Encounter (HOSPITAL_COMMUNITY)
Admission: RE | Admit: 2022-10-07 | Discharge: 2022-10-07 | Disposition: A | Discharge status: Home / Self Care | Payer: BC Managed Care – PPO | Source: Ambulatory Visit | Attending: Interventional Cardiology | Admitting: Interventional Cardiology

## 2022-10-07 ENCOUNTER — Ambulatory Visit (HOSPITAL_COMMUNITY): Payer: BC Managed Care – PPO

## 2022-10-07 DIAGNOSIS — Z955 Presence of coronary angioplasty implant and graft: Secondary | ICD-10-CM

## 2022-10-07 DIAGNOSIS — I2111 ST elevation (STEMI) myocardial infarction involving right coronary artery: Secondary | ICD-10-CM

## 2022-10-07 DIAGNOSIS — I252 Old myocardial infarction: Secondary | ICD-10-CM | POA: Diagnosis not present

## 2022-10-09 ENCOUNTER — Ambulatory Visit (HOSPITAL_COMMUNITY): Payer: BC Managed Care – PPO

## 2022-10-09 ENCOUNTER — Encounter (HOSPITAL_COMMUNITY): Payer: BC Managed Care – PPO

## 2022-10-12 ENCOUNTER — Encounter (HOSPITAL_COMMUNITY)
Admission: RE | Admit: 2022-10-12 | Discharge: 2022-10-12 | Disposition: A | Discharge status: Home / Self Care | Payer: BC Managed Care – PPO | Source: Ambulatory Visit | Attending: Interventional Cardiology | Admitting: Interventional Cardiology

## 2022-10-12 ENCOUNTER — Ambulatory Visit (HOSPITAL_COMMUNITY): Payer: BC Managed Care – PPO

## 2022-10-12 DIAGNOSIS — I2111 ST elevation (STEMI) myocardial infarction involving right coronary artery: Secondary | ICD-10-CM | POA: Diagnosis present

## 2022-10-12 DIAGNOSIS — I252 Old myocardial infarction: Secondary | ICD-10-CM | POA: Diagnosis not present

## 2022-10-12 DIAGNOSIS — Z48812 Encounter for surgical aftercare following surgery on the circulatory system: Secondary | ICD-10-CM | POA: Diagnosis not present

## 2022-10-12 DIAGNOSIS — Z955 Presence of coronary angioplasty implant and graft: Secondary | ICD-10-CM | POA: Diagnosis present

## 2022-10-14 ENCOUNTER — Ambulatory Visit (HOSPITAL_COMMUNITY): Payer: BC Managed Care – PPO

## 2022-10-14 ENCOUNTER — Encounter (HOSPITAL_COMMUNITY)
Admission: RE | Admit: 2022-10-14 | Discharge: 2022-10-14 | Disposition: A | Discharge status: Home / Self Care | Payer: BC Managed Care – PPO | Source: Ambulatory Visit | Attending: Interventional Cardiology | Admitting: Interventional Cardiology

## 2022-10-14 DIAGNOSIS — Z955 Presence of coronary angioplasty implant and graft: Secondary | ICD-10-CM | POA: Diagnosis not present

## 2022-10-14 DIAGNOSIS — I2111 ST elevation (STEMI) myocardial infarction involving right coronary artery: Secondary | ICD-10-CM

## 2022-10-16 ENCOUNTER — Ambulatory Visit (HOSPITAL_COMMUNITY): Payer: BC Managed Care – PPO

## 2022-10-16 ENCOUNTER — Encounter (HOSPITAL_COMMUNITY)
Admission: RE | Admit: 2022-10-16 | Discharge: 2022-10-16 | Disposition: A | Discharge status: Home / Self Care | Payer: BC Managed Care – PPO | Source: Ambulatory Visit | Attending: Interventional Cardiology | Admitting: Interventional Cardiology

## 2022-10-16 DIAGNOSIS — Z955 Presence of coronary angioplasty implant and graft: Secondary | ICD-10-CM

## 2022-10-16 DIAGNOSIS — I2111 ST elevation (STEMI) myocardial infarction involving right coronary artery: Secondary | ICD-10-CM

## 2022-10-19 ENCOUNTER — Encounter (HOSPITAL_COMMUNITY)
Admission: RE | Admit: 2022-10-19 | Discharge: 2022-10-19 | Disposition: A | Discharge status: Home / Self Care | Payer: BC Managed Care – PPO | Source: Ambulatory Visit | Attending: Interventional Cardiology | Admitting: Interventional Cardiology

## 2022-10-19 ENCOUNTER — Ambulatory Visit (HOSPITAL_COMMUNITY): Payer: BC Managed Care – PPO

## 2022-10-19 DIAGNOSIS — Z955 Presence of coronary angioplasty implant and graft: Secondary | ICD-10-CM | POA: Diagnosis not present

## 2022-10-19 DIAGNOSIS — I2111 ST elevation (STEMI) myocardial infarction involving right coronary artery: Secondary | ICD-10-CM

## 2022-10-21 ENCOUNTER — Encounter (HOSPITAL_COMMUNITY)
Admission: RE | Admit: 2022-10-21 | Discharge: 2022-10-21 | Disposition: A | Discharge status: Home / Self Care | Payer: BC Managed Care – PPO | Source: Ambulatory Visit | Attending: Interventional Cardiology | Admitting: Interventional Cardiology

## 2022-10-21 ENCOUNTER — Ambulatory Visit (HOSPITAL_COMMUNITY): Payer: BC Managed Care – PPO

## 2022-10-21 DIAGNOSIS — I2111 ST elevation (STEMI) myocardial infarction involving right coronary artery: Secondary | ICD-10-CM

## 2022-10-21 DIAGNOSIS — Z955 Presence of coronary angioplasty implant and graft: Secondary | ICD-10-CM

## 2022-10-23 ENCOUNTER — Ambulatory Visit (HOSPITAL_COMMUNITY): Payer: BC Managed Care – PPO

## 2022-10-23 ENCOUNTER — Encounter (HOSPITAL_COMMUNITY)
Admission: RE | Admit: 2022-10-23 | Discharge: 2022-10-23 | Disposition: A | Discharge status: Home / Self Care | Payer: BC Managed Care – PPO | Source: Ambulatory Visit | Attending: Interventional Cardiology | Admitting: Interventional Cardiology

## 2022-10-23 DIAGNOSIS — Z955 Presence of coronary angioplasty implant and graft: Secondary | ICD-10-CM | POA: Diagnosis not present

## 2022-10-23 DIAGNOSIS — I2111 ST elevation (STEMI) myocardial infarction involving right coronary artery: Secondary | ICD-10-CM

## 2022-10-26 ENCOUNTER — Ambulatory Visit (HOSPITAL_COMMUNITY): Payer: BC Managed Care – PPO

## 2022-10-26 ENCOUNTER — Encounter (HOSPITAL_COMMUNITY): Payer: BC Managed Care – PPO

## 2022-10-28 ENCOUNTER — Encounter (HOSPITAL_COMMUNITY)
Admission: RE | Admit: 2022-10-28 | Discharge: 2022-10-28 | Disposition: A | Discharge status: Home / Self Care | Payer: BC Managed Care – PPO | Source: Ambulatory Visit | Attending: Interventional Cardiology | Admitting: Interventional Cardiology

## 2022-10-28 ENCOUNTER — Ambulatory Visit (HOSPITAL_COMMUNITY): Payer: BC Managed Care – PPO

## 2022-10-28 DIAGNOSIS — Z955 Presence of coronary angioplasty implant and graft: Secondary | ICD-10-CM | POA: Diagnosis not present

## 2022-10-28 DIAGNOSIS — I2111 ST elevation (STEMI) myocardial infarction involving right coronary artery: Secondary | ICD-10-CM

## 2022-10-30 ENCOUNTER — Encounter (HOSPITAL_COMMUNITY)
Admission: RE | Admit: 2022-10-30 | Discharge: 2022-10-30 | Disposition: A | Discharge status: Home / Self Care | Payer: BC Managed Care – PPO | Source: Ambulatory Visit | Attending: Interventional Cardiology | Admitting: Interventional Cardiology

## 2022-10-30 ENCOUNTER — Ambulatory Visit (HOSPITAL_COMMUNITY): Payer: BC Managed Care – PPO

## 2022-10-30 DIAGNOSIS — Z955 Presence of coronary angioplasty implant and graft: Secondary | ICD-10-CM | POA: Diagnosis not present

## 2022-10-30 DIAGNOSIS — I2111 ST elevation (STEMI) myocardial infarction involving right coronary artery: Secondary | ICD-10-CM

## 2022-10-30 IMAGING — MR MR SHOULDER*R* W/O CM
5 series · 36 of 40 positions shown · non-contrast
Comparison: None.

CLINICAL DATA: Chronic shoulder pain.  Limited range of motion.

EXAM:
MRI OF THE RIGHT SHOULDER WITHOUT CONTRAST
TECHNIQUE: Multiplanar, multisequence MR imaging of the shoulder was performed.
No intravenous contrast was administered.

[Series 3: T2 fat-sat · axial · 4.0mm · 0.55mm/px · z∈[-55,+43]mm · 8 of 22 slices shown (1 of 3)]
[im 1/22]
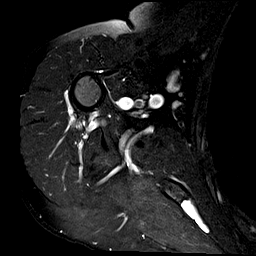
[im 4/22]
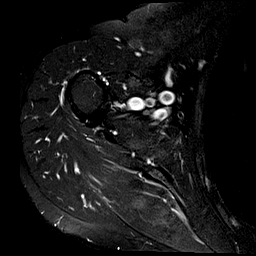
[im 7/22]
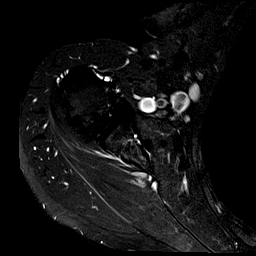
[im 10/22]
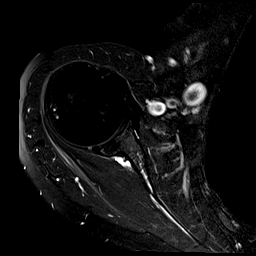
[im 13/22]
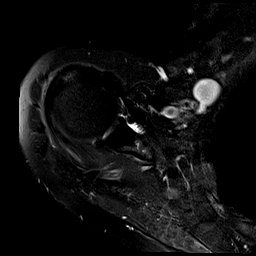
[im 16/22]
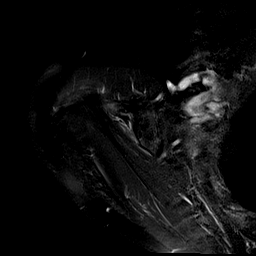
[im 19/22]
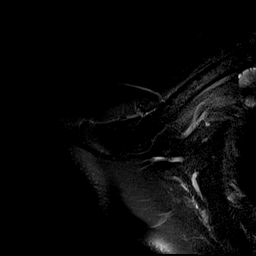
[im 22/22]
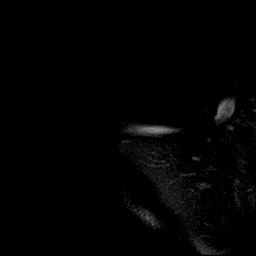

[Series 4: T2 fat-sat · oblique · 4.0mm · 0.55mm/px · 9 of 21 slices shown (2 of 3)]
[im 1/21]
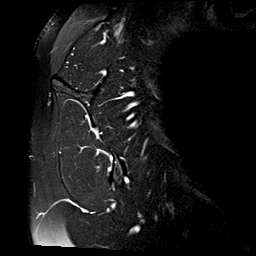
[im 3/21]
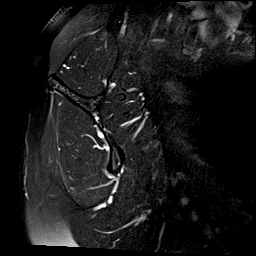
[im 6/21]
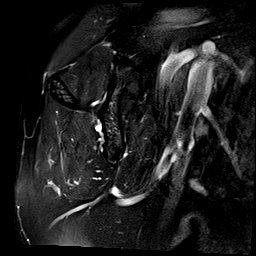
[im 8/21]
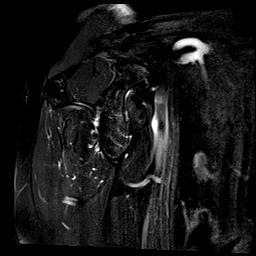
[im 11/21]
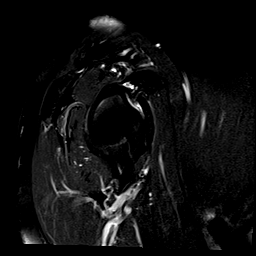
[im 13/21]
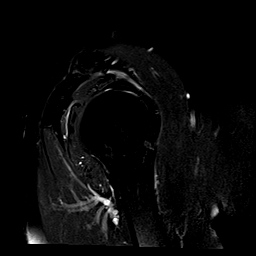
[im 16/21]
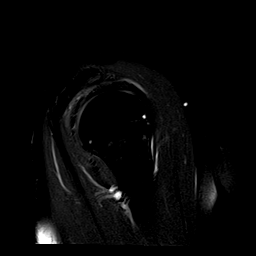
[im 18/21]
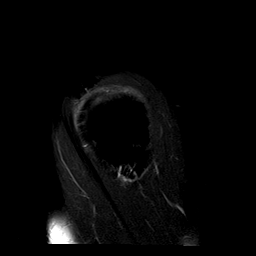
[im 21/21]
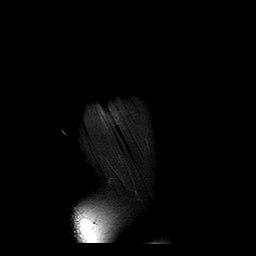

[Series 5: T1 · oblique · 4.0mm · 0.27mm/px · 5 of 21 slices shown]
[im 1/21]
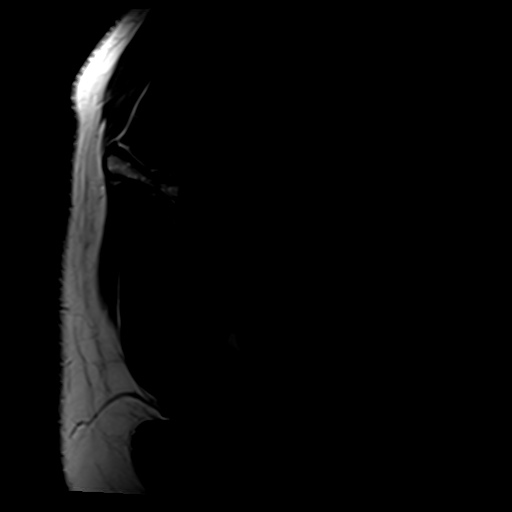
[im 3/21]
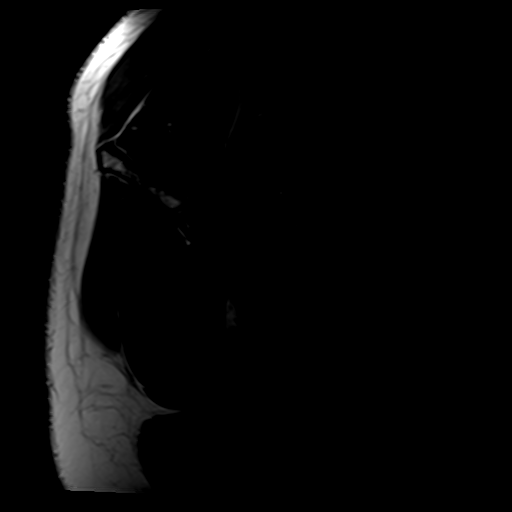
[im 6/21]
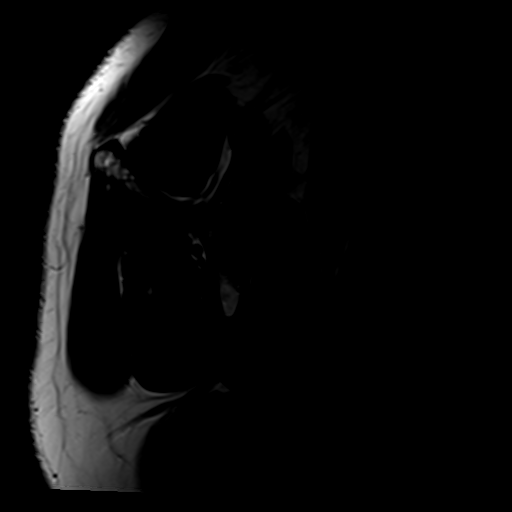
[im 8/21]
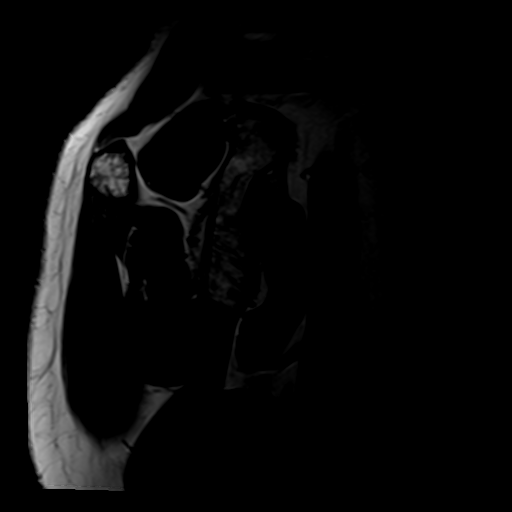
[im 13/21]
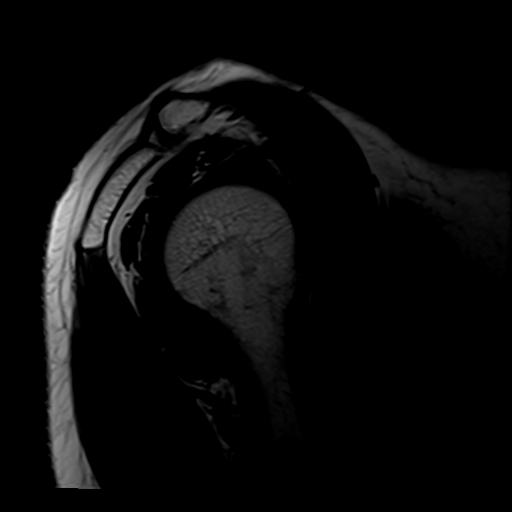

[Series 6: T2 fat-sat · sagittal · 4.0mm · 0.55mm/px · 7 of 16 slices shown (3 of 3)]
[im 1/16]
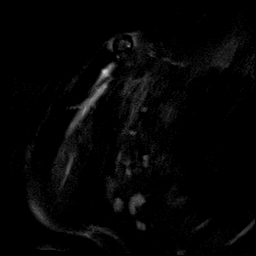
[im 3/16]
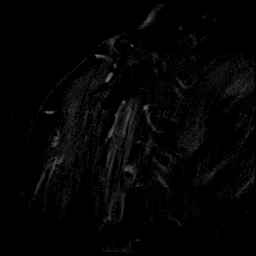
[im 6/16]
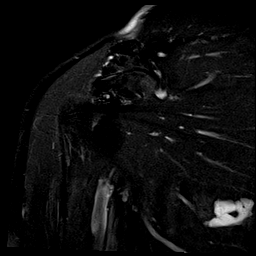
[im 8/16]
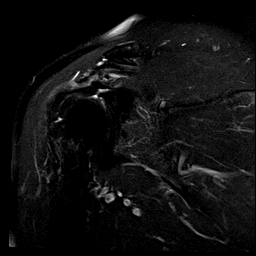
[im 11/16]
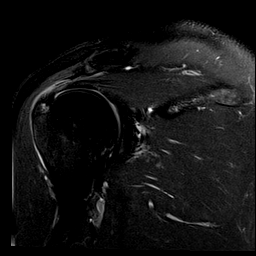
[im 13/16]
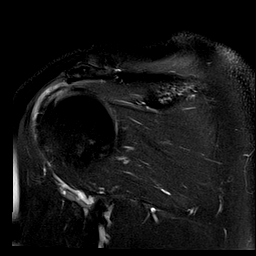
[im 16/16]
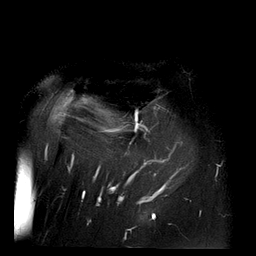

[Series 7: PD · sagittal · 4.0mm · 0.27mm/px · 7 of 18 slices shown]
[im 1/18]
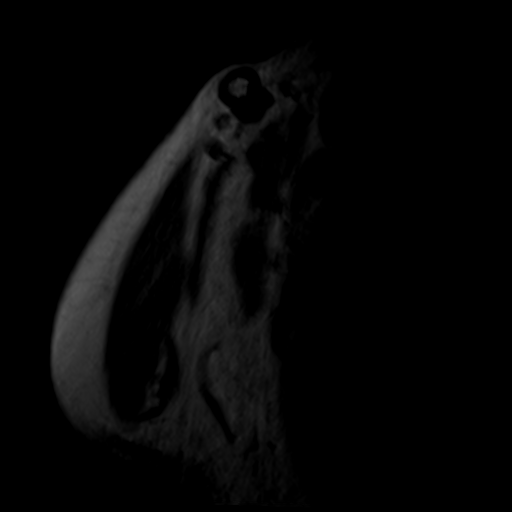
[im 3/18]
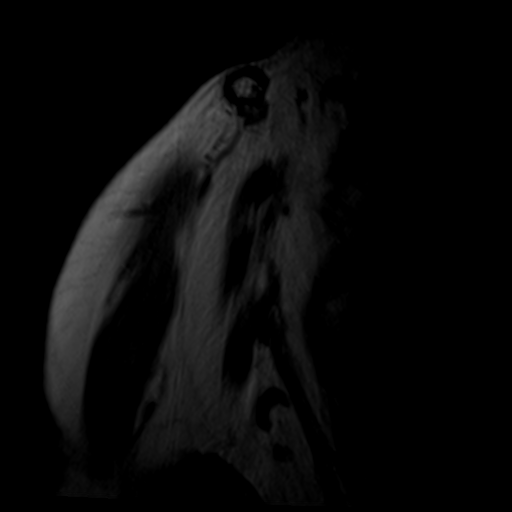
[im 6/18]
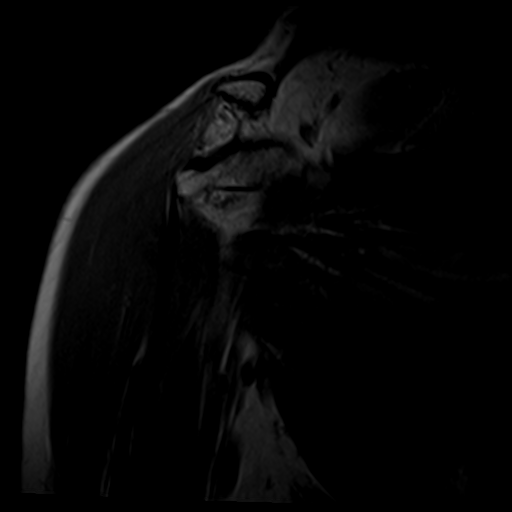
[im 9/18]
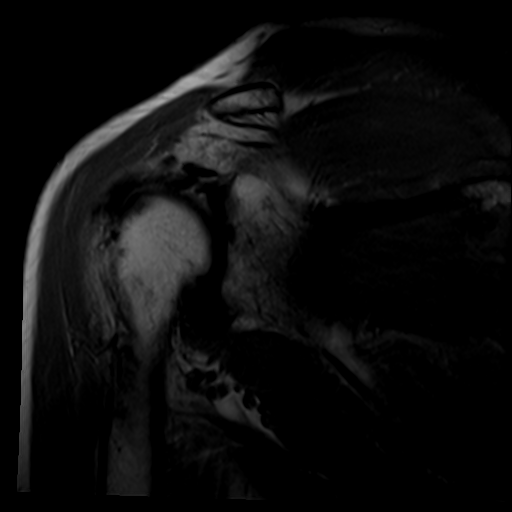
[im 12/18]
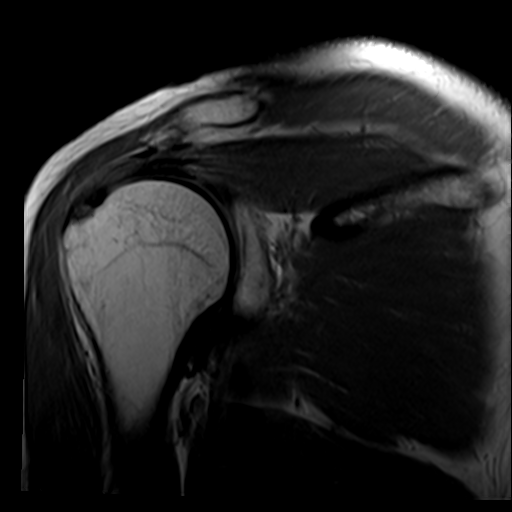
[im 15/18]
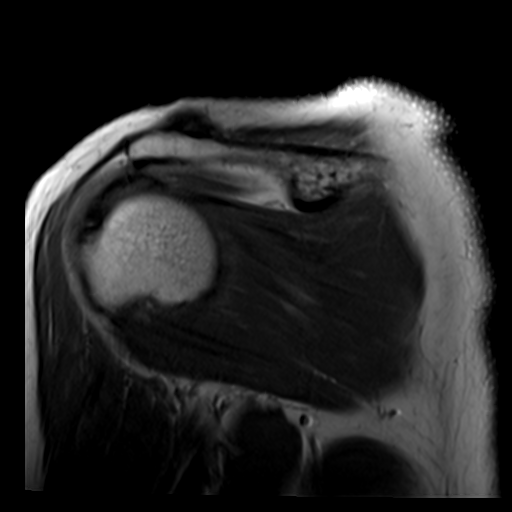
[im 18/18]
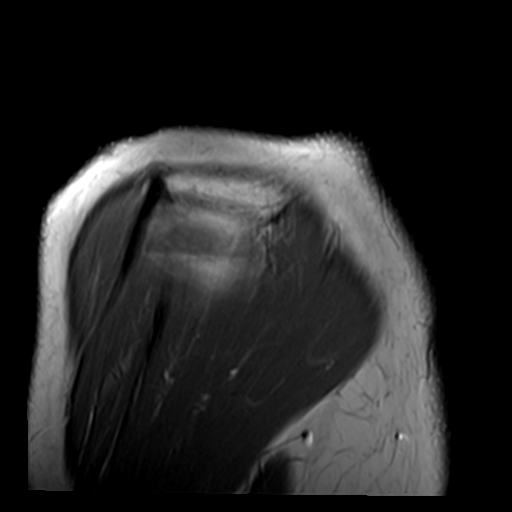

[36 of 40 positions shown; findings below may reference images not displayed]

FINDINGS: Rotator cuff: Mild tendinosis of the supraspinatus tendon with a
small partial-thickness bursal surface tear. Moderate tendinosis of
the infraspinatus tendon. Teres minor tendon is intact.
Subscapularis tendon is intact.

Muscles: No muscle atrophy or edema. No intramuscular fluid
collection or hematoma.

Biceps Long Head: Intraarticular and extraarticular portions of the
biceps tendon are intact.

Acromioclavicular Joint: No significant arthropathy of the
acromioclavicular joint. Type II acromion. Small amount of
subacromial/subdeltoid bursal fluid.

Glenohumeral Joint: No joint effusion. No chondral defect.

Labrum: Grossly intact, but evaluation is limited by lack of
intraarticular fluid/contrast.

Bones: No fracture or dislocation. No aggressive osseous lesion.

Other: No fluid collection or hematoma.
IMPRESSION: 1. Mild tendinosis of the supraspinatus tendon with a small
partial-thickness bursal surface tear.
2. Moderate tendinosis of the infraspinatus tendon.
3. Mild subacromial/subdeltoid bursitis.

## 2022-10-30 NOTE — Progress Notes (Signed)
Reviewed home exercise Rx with patient today.  Encouraged warm-up, cool-down, and stretching. Reviewed THRR of 66 - 132 and keeping RPE between 11-13. Encouraged to hydrate with activity.  Reviewed weather parameters for temperature and humidity for safe exercise outdoors. Reviewed S/S to terminate exercise and when to call 911 vs MD. Reviewed the use of NTG and pt was encouraged to carry at all times. Pt encouraged to always carry a cell phone for safety when exercising outdoors. Pt verbalized understanding of the home exercise Rx and was provided a copy.   Lesly Rubenstein MS, ACSM-CEP, CCRP

## 2022-11-02 ENCOUNTER — Ambulatory Visit (HOSPITAL_COMMUNITY): Payer: BC Managed Care – PPO

## 2022-11-02 ENCOUNTER — Encounter (HOSPITAL_COMMUNITY)
Admission: RE | Admit: 2022-11-02 | Discharge: 2022-11-02 | Disposition: A | Discharge status: Home / Self Care | Payer: BC Managed Care – PPO | Source: Ambulatory Visit | Attending: Interventional Cardiology | Admitting: Interventional Cardiology

## 2022-11-02 DIAGNOSIS — I2111 ST elevation (STEMI) myocardial infarction involving right coronary artery: Secondary | ICD-10-CM

## 2022-11-02 DIAGNOSIS — Z955 Presence of coronary angioplasty implant and graft: Secondary | ICD-10-CM | POA: Diagnosis not present

## 2022-11-02 NOTE — Progress Notes (Signed)
Cardiac Individual Treatment Plan  Patient Details  Name: [name] MRN: [id number] Date of Birth: 1966-09-01 Referring Provider:   Flowsheet Row INTENSIVE CARDIAC REHAB ORIENT from 09/22/2022 in [facility] for Heart, Vascular, & [place]  Referring Provider Larae Grooms, MD       Initial Encounter Date:  [facility] from 09/22/2022 in [facility] for Heart, Vascular, & Lung Health  Date 09/22/22       Visit Diagnosis: 06/24/2022 STEMI  06/24/22 Status post insertion of drug eluting coronary artery stent/RCA  Patient's Home Medications on Admission:  Current Outpatient Medications:    aspirin EC 81 MG tablet, Take 1 tablet (81 mg total) by mouth daily. Swallow whole., Disp: 30 tablet, Rfl: 3   Evolocumab (REPATHA SURECLICK) [id number] MG/ML SOAJ, Inject 140 mg into the skin every 14 (fourteen) days., Disp: 6 mL, Rfl: 3   melatonin 5 MG TABS, Take 10 mg by mouth at bedtime., Disp: , Rfl:    Multiple Vitamins-Minerals (ONE-A-DAY FOR HER VITACRAVES) CHEW, Chew 1 tablet by mouth in the morning., Disp: , Rfl:    nitroGLYCERIN (NITROSTAT) 0.4 MG SL tablet, Place 1 tablet (0.4 mg total) under the tongue every 5 (five) minutes as needed., Disp: 25 tablet, Rfl: 2   ticagrelor (BRILINTA) 90 MG TABS tablet, Take 1 tablet (90 mg total) by mouth 2 (two) times daily., Disp: 60 tablet, Rfl: 11  Past Medical History: Past Medical History:  Diagnosis Date   Acute ST elevation myocardial infarction (STEMI) involving right coronary artery ([place]) 06/24/2022   100% dRCA => DES PCI =.> Synergy XD DES 2.75 x 16-3.0 mm postdilation), reduced to 0%.   Anemia    Chicken pox    Coronary artery disease involving native coronary artery of native heart with unstable angina pectoris ([place]) 06/24/2022   Inf STEMI =>Cardiac Cath-PCI 06/24/2022: RIGHT DOMINANT.  CULPRIT LESION-distal RCA 100% (left to right collaterals) => DES  PCI (Synergy XD DES 2.75 x 16-3.0 mm postdilation), reduced to 0%.  60% proximal RCA.  Diffuse 35% proximal LAD.  Normal LCx.   Left leg DVT ([place]) 2014   Uterine leiomyoma     Tobacco Use: Social History   Tobacco Use  Smoking Status Former   Packs/day: 0.25   Years: 15.00   Total pack years: 3.75   Types: Cigarettes   Quit date: 2022   Years since quitting: 2.1  Smokeless Tobacco Never    Labs: Review Flowsheet       Latest Ref Rng & Units 05/05/2018 07/29/2020 06/24/2022 08/06/2022  Labs for ITP Cardiac and Pulmonary Rehab  Cholestrol 100 - 199 mg/dL 246  246  257  116   LDL (calc) 0 - 99 mg/dL 167  162  184  49   HDL-C >39 mg/dL 68.60  71.50  62  56   Trlycerides 0 - 149 mg/dL 55.0  66.0  57  47   Hemoglobin A1c 4.8 - 5.6 % 6.4  6.3  6.1  -  TCO2 22 - 32 mmol/L - - 29  -    Capillary Blood Glucose: No results found for: "GLUCAP"   Exercise Target Goals: Exercise Program Goal: Individual exercise prescription set using results from initial 6 min walk test and THRR while considering  patient's activity barriers and safety.   Exercise Prescription Goal: Initial exercise prescription builds to 30-45 minutes a day of aerobic activity, 2-3 days per week.  Home exercise guidelines will  be given to patient during program as part of exercise prescription that the participant will acknowledge.  Activity Barriers & Risk Stratification:  Activity Barriers & Cardiac Risk Stratification - 09/22/22 0950       Activity Barriers & Cardiac Risk Stratification   Activity Barriers None    Cardiac Risk Stratification Moderate             6 Minute Walk:  6 Minute Walk     Row Name 09/22/22 0916         6 Minute Walk   Phase Initial     Distance 1263 feet     Walk Time 6 minutes     # of Rest Breaks 0     MPH 2.4     METS 3.8     RPE 7     Perceived Dyspnea  0     VO2 Peak 13.3     Symptoms No     Resting HR 87 bpm     Resting BP 112/82     Resting Oxygen  Saturation  100 %     Exercise Oxygen Saturation  during 6 min walk 100 %     Max Ex. HR 82 bpm     Max Ex. BP 128/80     2 Minute Post BP 114/78              Oxygen Initial Assessment:   Oxygen Re-Evaluation:   Oxygen Discharge (Final Oxygen Re-Evaluation):   Initial Exercise Prescription:  Initial Exercise Prescription - 09/22/22 0900       Date of Initial Exercise RX and Referring Provider   Date 09/22/22    Referring Provider Larae Grooms, MD    Expected Discharge Date 11/20/22      Arm Ergometer   Level 1.3    Watts 15    RPM 60    Minutes 15    METs 3.8      Recumbant Elliptical   Level 2    RPM 60    Watts 25    Minutes 15    METs 3.8      Prescription Details   Frequency (times per week) 3    Duration Progress to 30 minutes of continuous aerobic without signs/symptoms of physical distress      Intensity   THRR 40-80% of Max Heartrate 66 - 132    Ratings of Perceived Exertion 11-13    Perceived Dyspnea 0-4      Progression   Progression Continue progressive overload as per policy without signs/symptoms or physical distress.      Resistance Training   Training Prescription Yes    Weight 3 lbs    Reps 10-15             Perform Capillary Blood Glucose checks as needed.  Exercise Prescription Changes:   Exercise Prescription Changes     Row Name 10/02/22 1400 10/14/22 1030 10/28/22 1400 10/30/22 1100       Response to Exercise   Blood Pressure (Admit) 104/60 102/62 110/70 108/62    Blood Pressure (Exercise) 124/78 108/72 128/80 138/66    Blood Pressure (Exit) 98/60 98/64 100/66 96/60    Heart Rate (Admit) 83 bpm 873 bpm 79 bpm 68 bpm    Heart Rate (Exercise) 97 bpm 116 bpm 104 bpm 110 bpm    Heart Rate (Exit) 83 bpm 91 bpm 80 bpm 77 bpm    Rating of Perceived Exertion (Exercise) '10 11 11 '$ 11  Symptoms None None None None    Comments Pt's first day in the CRP2 program Reviewed METs Reviewed METs and goals Reviewed Home  exercise Rx    Duration Continue with 30 min of aerobic exercise without signs/symptoms of physical distress. Continue with 30 min of aerobic exercise without signs/symptoms of physical distress. Continue with 30 min of aerobic exercise without signs/symptoms of physical distress. Continue with 30 min of aerobic exercise without signs/symptoms of physical distress.    Intensity THRR unchanged THRR unchanged THRR unchanged THRR unchanged      Progression   Progression Continue to progress workloads to maintain intensity without signs/symptoms of physical distress. Continue to progress workloads to maintain intensity without signs/symptoms of physical distress. Continue to progress workloads to maintain intensity without signs/symptoms of physical distress. Continue to progress workloads to maintain intensity without signs/symptoms of physical distress.    Average METs 2.95 2.6 3.95 3.5      Resistance Training   Training Prescription Yes No No Yes    Weight 3 lbs No weights on Wednesdys No weights on Wednesdys 3 lbs    Reps 10-15 -- -- 10-15    Time 10 Minutes -- -- 10 Minutes      Interval Training   Interval Training No No No No      Arm Ergometer   Level 1.3 1.'3 3 3    '$ Watts 11 13 -- --    RPM 45 55 -- --    Minutes '15 15 15 15    '$ METs 2.3 2.6 3.5 3.5      Recumbant Elliptical   Level '2 2 4 4    '$ RPM -- -- 49 48    Watts 64 -- 77 64    Minutes '15 15 15 15    '$ METs 3.6 2.5 4.4 3.4      Home Exercise Plan   Plans to continue exercise at -- -- -- Home (comment)    Frequency -- -- -- Add 3 additional days to program exercise sessions.    Initial Home Exercises Provided -- -- -- 10/30/22             Exercise Comments:   Exercise Comments     Row Name 10/02/22 1433 10/15/22 0841 10/28/22 1435 10/30/22 1110     Exercise Comments Pt's first day in the CRP2 program. No complaints. Tolerated session well. Reviewed METs. Pt will increase workload on recumbent stepper next  session. Reviewed METs and goals. Pt is progressing well and is eager to advance workloads. Reviewed Home exercise Rx with patient. Pt is walking 2-3 x/week for 30 minutes. Pt will continue walking in addtion to the CRP2 program. Pt verbalized understnading of the HEP and was provided a copy.             Exercise Goals and Review:   Exercise Goals     Row Name 09/22/22 0949             Exercise Goals   Increase Physical Activity Yes       Intervention Provide advice, education, support and counseling about physical activity/exercise needs.;Develop an individualized exercise prescription for aerobic and resistive training based on initial evaluation findings, risk stratification, comorbidities and participant's personal goals.       Expected Outcomes Short Term: Attend rehab on a regular basis to increase amount of physical activity.;Long Term: Add in home exercise to make exercise part of routine and to increase amount of physical activity.;Long Term: Exercising regularly at  least 3-5 days a week.       Increase Strength and Stamina Yes       Intervention Provide advice, education, support and counseling about physical activity/exercise needs.;Develop an individualized exercise prescription for aerobic and resistive training based on initial evaluation findings, risk stratification, comorbidities and participant's personal goals.       Expected Outcomes Short Term: Increase workloads from initial exercise prescription for resistance, speed, and METs.;Short Term: Perform resistance training exercises routinely during rehab and add in resistance training at home;Long Term: Improve cardiorespiratory fitness, muscular endurance and strength as measured by increased METs and functional capacity (6MWT)       Able to understand and use rate of perceived exertion (RPE) scale Yes       Intervention Provide education and explanation on how to use RPE scale       Expected Outcomes Short Term: Able to  use RPE daily in rehab to express subjective intensity level;Long Term:  Able to use RPE to guide intensity level when exercising independently       Knowledge and understanding of Target Heart Rate Range (THRR) Yes       Intervention Provide education and explanation of THRR including how the numbers were predicted and where they are located for reference       Expected Outcomes Short Term: Able to state/look up THRR;Short Term: Able to use daily as guideline for intensity in rehab;Long Term: Able to use THRR to govern intensity when exercising independently       Understanding of Exercise Prescription Yes       Intervention Provide education, explanation, and written materials on patient's individual exercise prescription       Expected Outcomes Short Term: Able to explain program exercise prescription;Long Term: Able to explain home exercise prescription to exercise independently                Exercise Goals Re-Evaluation :  Exercise Goals Re-Evaluation     Row Name 10/02/22 1431 10/28/22 1432           Exercise Goal Re-Evaluation   Exercise Goals Review Increase Physical Activity;Knowledge and understanding of Target Heart Rate Range (THRR);Increase Strength and Stamina;Able to understand and use rate of perceived exertion (RPE) scale;Understanding of Exercise Prescription Increase Physical Activity;Knowledge and understanding of Target Heart Rate Range (THRR);Increase Strength and Stamina;Able to understand and use rate of perceived exertion (RPE) scale;Understanding of Exercise Prescription      Comments Pt's first day in the CRP2 program. Pt understands the RPE scale, Exercise Rx and THRR. Reviewed METs and goals. Pt is progressing well, peak METs today 4.4. 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 When the patient started the CRP2 program, she was having trouble with this task at work. She also voices increased grip strength which was a  goal.      Expected Outcomes Will continue to montior and progress exercise workloads as tolerated. Will continue to montior and progress exercise workloads as tolerated.               Discharge Exercise Prescription (Final Exercise Prescription Changes):  Exercise Prescription Changes - 10/30/22 1100       Response to Exercise   Blood Pressure (Admit) 108/62    Blood Pressure (Exercise) 138/66    Blood Pressure (Exit) 96/60    Heart Rate (Admit) 68 bpm    Heart Rate (Exercise) 110 bpm    Heart Rate (Exit)  77 bpm    Rating of Perceived Exertion (Exercise) 11    Symptoms None    Comments Reviewed Home exercise Rx    Duration Continue with 30 min of aerobic exercise without signs/symptoms of physical distress.    Intensity THRR unchanged      Progression   Progression Continue to progress workloads to maintain intensity without signs/symptoms of physical distress.    Average METs 3.5      Resistance Training   Training Prescription Yes    Weight 3 lbs    Reps 10-15    Time 10 Minutes      Interval Training   Interval Training No      Arm Ergometer   Level 3    Minutes 15    METs 3.5      Recumbant Elliptical   Level 4    RPM 48    Watts 64    Minutes 15    METs 3.4      Home Exercise Plan   Plans to continue exercise at Home (comment)    Frequency Add 3 additional days to program exercise sessions.    Initial Home Exercises Provided 10/30/22             Nutrition:  Target Goals: Understanding of nutrition guidelines, daily intake of sodium '1500mg'$ , cholesterol '200mg'$ , calories 30% from fat and 7% or less from saturated fats, daily to have 5 or more servings of fruits and vegetables.  Biometrics:  Pre Biometrics - 09/22/22 0815       Pre Biometrics   Waist Circumference 32 inches    Hip Circumference 39.5 inches    Waist to Hip Ratio 0.81 %    Triceps Skinfold 10 mm    % Body Fat 28.1 %    Grip Strength 26 kg    Flexibility 13.5 in    Single  Leg Stand 30 seconds              Nutrition Therapy Plan and Nutrition Goals:  Nutrition Therapy & Goals - 10/02/22 1339       Nutrition Therapy   Diet Heart Healthy Diet      Personal Nutrition Goals   Nutrition Goal Patient to identify strategies for managing cardiovacular risk by attending the weekly Pritikin education and nutrition series    Personal Goal #2 Patient to improve diet quality by using the plate method as a guide for meal planning to include lean protein/plant protein, fruits, vegetables, whole grains, and nonfat dairy as part of a balanced diet.    Comments [name] experienced transaminase on atorvastatin and Zetia.  ALT as high as 449 AST 284.  Both medications were stopped and LFTs began to improve.  LDL-C at time of MI was 184. LDL improved <70 on atorvastatin andezetimibe 10 mg daily, but, unfortunately, had to be stopped due to elevated LFTs. She has started on Repatha. She reports drinking two Boost Plus (360kcals, 14g protein each); her typical adult weight is around 140-145#.   Patient will benefit from participation in intensive cardiac rehab for exercise, nutrition, and lifestyle modification.      Intervention Plan   Intervention Prescribe, educate and counsel regarding individualized specific dietary modifications aiming towards targeted core components such as weight, hypertension, lipid management, diabetes, heart failure and other comorbidities.;Nutrition handout(s) given to patient.    Expected Outcomes Short Term Goal: Understand basic principles of dietary content, such as calories, fat, sodium, cholesterol and nutrients.;Long Term Goal: Adherence to prescribed nutrition  plan.             Nutrition Assessments:  Nutrition Assessments - 10/06/22 0936       Rate Your Plate Scores   Pre Score 54            MEDIFICTS Score Key: ?70 Need to make dietary changes  40-70 Heart Healthy Diet ? 40 Therapeutic Level Cholesterol Diet   Flowsheet Row  INTENSIVE CARDIAC REHAB from 10/05/2022 in [facility] for Heart, Vascular, & Lung Health  Picture Your Plate Total Score on Admission 54      Picture Your Plate Scores: [id number] Unhealthy dietary pattern with much room for improvement. 41-50 Dietary pattern unlikely to meet recommendations for good health and room for improvement. 51-60 More healthful dietary pattern, with some room for improvement.  >60 Healthy dietary pattern, although there may be some specific behaviors that could be improved.    Nutrition Goals Re-Evaluation:  Nutrition Goals Re-Evaluation     [place] Name 10/02/22 1339             Goals   Current Weight 143 lb 11.8 oz (65.2 kg)       Comment LipoproteinA  82.6, lipids WNL, hepatic panel WNL, A1c 6.1       Expected Outcome [name] experienced transaminase on atorvastatin and Zetia. ALT as high as 449 AST 284. Both medications were stopped and LFTs began to improve. LDL-C at time of MI was 184. LDL improved <70 on atorvastatin andezetimibe 10 mg daily, but, unfortunately, had to be stopped due to elevated LFTs. She has started on Repatha. Patient will benefit from participation in intensive cardiac rehab for exercise, nutrition, and lifestyle modification.                Nutrition Goals Re-Evaluation:  Nutrition Goals Re-Evaluation     [place] Name 10/02/22 1339             Goals   Current Weight 143 lb 11.8 oz (65.2 kg)       Comment LipoproteinA  82.6, lipids WNL, hepatic panel WNL, A1c 6.1       Expected Outcome [name] experienced transaminase on atorvastatin and Zetia. ALT as high as 449 AST 284. Both medications were stopped and LFTs began to improve. LDL-C at time of MI was 184. LDL improved <70 on atorvastatin andezetimibe 10 mg daily, but, unfortunately, had to be stopped due to elevated LFTs. She has started on Repatha. Patient will benefit from participation in intensive cardiac rehab for exercise, nutrition, and lifestyle modification.                 Nutrition Goals Discharge (Final Nutrition Goals Re-Evaluation):  Nutrition Goals Re-Evaluation - 10/02/22 1339       Goals   Current Weight 143 lb 11.8 oz (65.2 kg)    Comment LipoproteinA  82.6, lipids WNL, hepatic panel WNL, A1c 6.1    Expected Outcome [name] experienced transaminase on atorvastatin and Zetia. ALT as high as 449 AST 284. Both medications were stopped and LFTs began to improve. LDL-C at time of MI was 184. LDL improved <70 on atorvastatin andezetimibe 10 mg daily, but, unfortunately, had to be stopped due to elevated LFTs. She has started on Repatha. Patient will benefit from participation in intensive cardiac rehab for exercise, nutrition, and lifestyle modification.             Psychosocial: Target Goals: Acknowledge presence or absence of significant depression and/or stress, maximize coping skills,  provide positive support system. Participant is able to verbalize types and ability to use techniques and skills needed for reducing stress and depression.  Initial Review & Psychosocial Screening:  Initial Psych Review & Screening - 09/22/22 0845       Initial Review   Current issues with Current Stress Concerns;Current Sleep Concerns    Source of Stress Concerns Chronic Illness;Family    Comments Mother died suddenly in 04-22-2023 of this past year. [name] had MI in October had side effects from medications. [name] says she has had difficulty sleeping since her mother passed away this past summer. [name] and her mother were very close they talked daily.      Family Dynamics   Good Support System? Yes   [name] has her husband, children, siblings and father for support   Comments [name] says that she has a good      Barriers   Psychosocial barriers to participate in program The patient should benefit from training in stress management and relaxation.      Screening Interventions   Interventions Encouraged to exercise;To provide support and resources with identified  psychosocial needs    Expected Outcomes Long Term Goal: Stressors or current issues are controlled or eliminated.             Quality of Life Scores:  Quality of Life - 09/22/22 1024       Quality of Life   Select Quality of Life      Quality of Life Scores   Health/Function Pre 28.33 %    Socioeconomic Pre 30 %    Psych/Spiritual Pre 30 %    Family Pre 30 %    GLOBAL Pre 29.29 %            Scores of 19 and below usually indicate a poorer quality of life in these areas.  A difference of  2-3 points is a clinically meaningful difference.  A difference of 2-3 points in the total score of the Quality of Life Index has been associated with significant improvement in overall quality of life, self-image, physical symptoms, and general health in studies assessing change in quality of life.  PHQ-9: Review Flowsheet       09/22/2022  Depression screen PHQ 2/9  Decreased Interest 0  Down, Depressed, Hopeless 0  PHQ - 2 Score 0  Altered sleeping 1  Tired, decreased energy 0  Change in appetite 0  Feeling bad or failure about yourself  0  Trouble concentrating 0  Moving slowly or fidgety/restless 0  Suicidal thoughts 0  PHQ-9 Score 1  Difficult doing work/chores Somewhat difficult   Interpretation of Total Score  Total Score Depression Severity:  1-4 = Minimal depression, 5-9 = Mild depression, 10-14 = Moderate depression, 15-19 = Moderately severe depression, 20-27 = Severe depression   Psychosocial Evaluation and Intervention:   Psychosocial Re-Evaluation:  Psychosocial Re-Evaluation     [place] Name 10/02/22 1441 11/02/22 0944           Psychosocial Re-Evaluation   Current issues with Current Stress Concerns;Current Sleep Concerns Current Stress Concerns;Current Sleep Concerns      Comments [name] did not voice any increased concerns or stressors on the first day of exercise [name] did not voice any increased concerns or stressors during exercise. [name] continues to deal  with the loss of her Mom this past 22-Apr-2023.      Interventions Stress management education;Encouraged to attend Cardiac Rehabilitation for the exercise Stress management education;Encouraged to attend Cardiac  Rehabilitation for the exercise      Continue Psychosocial Services  Follow up required by staff Follow up required by staff        Initial Review   Source of Stress Concerns Chronic Illness;Family Chronic Illness;Family      Comments Will continue to montior and offer support as needed Will continue to montior and offer support as needed               Psychosocial Discharge (Final Psychosocial Re-Evaluation):  Psychosocial Re-Evaluation - 11/02/22 0944       Psychosocial Re-Evaluation   Current issues with Current Stress Concerns;Current Sleep Concerns    Comments [name] did not voice any increased concerns or stressors during exercise. [name] continues to deal with the loss of her Mom this past August.    Interventions Stress management education;Encouraged to attend Cardiac Rehabilitation for the exercise    Continue Psychosocial Services  Follow up required by staff      Initial Review   Source of Stress Concerns Chronic Illness;Family    Comments Will continue to montior and offer support as needed             Vocational Rehabilitation: Provide vocational rehab assistance to qualifying candidates.   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 Participant will state understanding/return demonstration of topics presented.    Education     Row Name 10/02/22 1200     Education   Cardiac Education Topics Pritikin    Select Core Videos     Core Videos   Educator Dietitian   Select Nutrition   Nutrition Calorie Density   Instruction Review Code 1- Verbalizes Understanding   Class Start Time 0810   Class Stop Time 0853   Class Time Calculation (min) 43 min    [place] Name 10/05/22 0900     Education   Cardiac Education Topics [place]   Select Workshops     Workshops   Educator Exercise Physiologist   Select Exercise   Exercise Workshop Exercise Basics: Building Your Action Plan   Instruction Review Code 1- Verbalizes Understanding   Class Start Time 0805   Class Stop Time 0858   Class Time Calculation (min) 53 min    [place] Name 10/07/22 1000     Education   Cardiac Education Topics [place]   Educator Dietitian   Weekly Topic Efficiency Cooking - Meals in a Snap   Instruction Review Code 1- Verbalizes Understanding   Class Start Time 0810   Class Stop Time 0848   Class Time Calculation (min) 38 min    [place] Name 10/12/22 0900     Education   Cardiac Education Topics Pritikin   Select Core Videos     Core Videos   Educator Dietitian   Select Nutrition   Nutrition Nutrition Action Plan   Instruction Review Code 1- Verbalizes Understanding   Class Start Time 0810   Class Stop Time 0852   Class Time Calculation (min) 42 min    [place] Name 10/14/22 0900  Education   Cardiac Education Topics [employer]   Educator Dietitian   Weekly Topic One-Pot Wonders   Instruction Review Code 1- Verbalizes Understanding   Class Start Time 0815   Class Stop Time [phone]   Class Time Calculation (min) 35 min    [place] Name 10/16/22 0900     Education   Cardiac Education Topics Pritikin   Select Core Videos     Core Videos   Educator Exercise Physiologist   Select General Education   General Education Hypertension and Heart Disease   Instruction Review Code 1- Verbalizes Understanding   Class Start  Time 0805   Class Stop Time 0850   Class Time Calculation (min) 45 min    [place] Name 10/19/22 0900     Education   Cardiac Education Topics Pritikin   Select Workshops     Workshops   Educator Exercise Physiologist   Select Psychosocial   Psychosocial Workshop Healthy Sleep for a Healthy Heart   Instruction Review Code 1- Verbalizes Understanding   Class Start Time 0810   Class Stop Time 0854   Class Time Calculation (min) 44 min    [place] Name 10/21/22 0900     Education   Cardiac Education Topics [employer]   Educator Dietitian   Weekly Topic Comforting Weekend Breakfasts   Instruction Review Code 1- Verbalizes Understanding   Class Start Time [phone]   Class Stop Time [phone]   Class Time Calculation (min) 32 min    [place] Name 10/23/22 0900     Education   Cardiac Education Topics Pritikin   Select Core Videos     Core Videos   Educator Dietitian   Select Nutrition   Nutrition Dining Out - Part 1   Instruction Review Code 1- Verbalizes Understanding   Class Start Time 0810   Class Stop Time 0855   Class Time Calculation (min) 45 min    [place] Name 10/28/22 0900     Education   Cardiac Education Topics [employer]   Educator Dietitian   Weekly Topic Fast Evening Meals   Instruction Review Code 1- Verbalizes Understanding   Class Start Time [phone]   Class Stop Time 0850   Class Time Calculation (min) 38 min    [place] Name 10/30/22 1000     Education   Cardiac Education Topics Pritikin   IT sales professional Nutrition   Nutrition Workshop Fueling a Designer, multimedia   Instruction Review Code 1- Information systems manager   Class Start Time 0820   Class Stop Time 0900   Class Time Calculation (min) 40 min            Core Videos: Exercise    Move It!  Clinical staff conducted group or individual video education with verbal and written  material and guidebook.  Patient learns the recommended Pritikin exercise program. Exercise with the goal of living a long, healthy life. Some of the health benefits of exercise include controlled diabetes, healthier blood pressure levels, improved cholesterol levels, improved heart and lung capacity, improved sleep, and better body composition. Everyone should speak with their doctor before starting or changing an exercise routine.  Biomechanical Limitations Clinical staff conducted group or individual video education with verbal and written material and guidebook.  Patient learns how biomechanical limitations can impact exercise and how we can mitigate and possibly overcome limitations to have an impactful and balanced exercise routine.  Body Composition Clinical staff conducted group or individual video education with verbal and written material and guidebook.  Patient learns that body composition (ratio of muscle mass to fat mass) is a key component to assessing overall fitness, rather than body weight alone. Increased fat mass, especially visceral belly fat, can put [place] at increased risk for metabolic syndrome, type 2 diabetes, heart disease, and even death. It is recommended to combine diet and exercise (cardiovascular and resistance training) to improve your body composition. Seek guidance from your physician and exercise physiologist before implementing an exercise routine.  Exercise Action Plan Clinical staff conducted group or individual video education with verbal and written material and guidebook.  Patient learns the recommended strategies to achieve and enjoy long-term exercise adherence, including variety, self-motivation, self-efficacy, and positive decision making. Benefits of exercise include fitness, good health, weight management, more energy, better sleep, less stress, and overall well-being.  Medical   Heart Disease Risk Reduction Clinical staff conducted group or individual  video education with verbal and written material and guidebook.  Patient learns our heart is our most vital organ as it circulates oxygen, nutrients, white blood cells, and hormones throughout the entire body, and carries waste away. Data supports a plant-based eating plan like the Pritikin Program for its effectiveness in slowing progression of and reversing heart disease. The video provides a number of recommendations to address heart disease.   Metabolic Syndrome and Belly Fat  Clinical staff conducted group or individual video education with verbal and written material and guidebook.  Patient learns what metabolic syndrome is, how it leads to heart disease, and how one can reverse it and keep it from coming back. You have metabolic syndrome if you have 3 of the following 5 criteria: abdominal obesity, high blood pressure, high triglycerides, low HDL cholesterol, and high blood sugar.  Hypertension and Heart Disease Clinical staff conducted group or individual video education with verbal and written material and guidebook.  Patient learns that high blood pressure, or hypertension, is very common in the [place]. Hypertension is largely due to excessive salt intake, but other important risk factors include being overweight, physical inactivity, drinking too much alcohol, smoking, and not eating enough potassium from fruits and vegetables. High blood pressure is a leading risk factor for heart attack, stroke, congestive heart failure, dementia, kidney failure, and premature death. Long-term effects of excessive salt intake include stiffening of the arteries and thickening of heart muscle and organ damage. Recommendations include ways to reduce hypertension and the risk of heart disease.  Diseases of Our Time - Focusing on Diabetes Clinical staff conducted group or individual video education with verbal and written material and guidebook.  Patient learns why the best way to stop diseases of our  time is prevention, through food and other lifestyle changes. Medicine (such as prescription pills and surgeries) is often only a Band-Aid on the problem, not a long-term solution. Most common diseases of our time include obesity, type 2 diabetes, hypertension, heart disease, and cancer. The Pritikin Program is recommended and has been proven to help reduce, reverse, and/or prevent the damaging effects of metabolic syndrome.  Nutrition   Overview of the Pritikin Eating Plan  Clinical staff conducted group or individual video education with verbal and written material and guidebook.  Patient learns about the [place] for disease risk reduction.  The [place] emphasizes a wide variety of unrefined, minimally-processed carbohydrates, like fruits, vegetables, whole grains, and legumes. Go, Caution, and Stop food choices are explained. Plant-based and lean animal proteins are emphasized. Rationale provided for low sodium intake for blood pressure control, low added sugars for blood sugar stabilization, and low added fats and oils for coronary artery disease risk reduction and weight management.  Calorie Density  Clinical staff conducted group or individual video education with verbal and written material and guidebook.  Patient learns about calorie density and how it impacts the Pritikin Eating Plan. Knowing the characteristics of the food you choose will help you decide whether those foods will lead to weight gain or weight loss, and whether you want to consume more or less of them. Weight loss is usually a side effect of the Pritikin Eating Plan because of its focus on low calorie-dense foods.  Label Reading  Clinical staff conducted group or individual video education with verbal and written material and guidebook.  Patient learns about the Pritikin recommended label reading guidelines and corresponding recommendations regarding calorie density, added sugars, sodium content, and  whole grains.  Dining Out - Part 1  Clinical staff conducted group or individual video education with verbal and written material and guidebook.  Patient learns that restaurant meals can be sabotaging because they can be so high in calories, fat, sodium, and/or sugar. Patient learns recommended strategies on how to positively address this and avoid unhealthy pitfalls.  Facts on Fats  Clinical staff conducted group or individual video education with verbal and written material and guidebook.  Patient learns that lifestyle modifications can be just as effective, if not more so, as many medications for lowering your risk of heart disease. A Pritikin lifestyle can help to reduce your risk of inflammation and atherosclerosis (cholesterol build-up, or plaque, in the artery walls). Lifestyle interventions such as dietary choices and physical activity address the cause of atherosclerosis. A review of the types of fats and their impact on blood cholesterol levels, along with dietary recommendations to reduce fat intake is also included.  Nutrition Action Plan  Clinical staff conducted group or individual video education with verbal and written material and guidebook.  Patient learns how to incorporate Pritikin recommendations into their lifestyle. Recommendations include planning and keeping personal health goals in mind as an important part of their success.  Healthy Mind-Set    Healthy Minds, Bodies, Hearts  Clinical staff conducted group or individual video education with verbal and written material and guidebook.  Patient learns how to identify when they are stressed. Video will discuss the impact of that stress, as well as the many benefits of stress management. Patient will also be introduced to stress management techniques. The way we think, act, and feel has an impact on our hearts.  How Our Thoughts Can Heal Our Hearts  Clinical staff conducted group or individual video education with verbal and  written material and guidebook.  Patient learns that negative thoughts can cause depression and anxiety. This can result in negative lifestyle behavior and serious health problems. Cognitive behavioral therapy is an effective method to help control our thoughts in order to change and improve our emotional outlook.  Additional Videos:  Exercise    Improving Performance  Clinical staff conducted group or individual video education with verbal and written material and guidebook.  Patient learns to use a non-linear approach by alternating intensity levels and lengths of time spent exercising to help burn more calories and lose  more body fat. Cardiovascular exercise helps improve heart health, metabolism, hormonal balance, blood sugar control, and recovery from fatigue. Resistance training improves strength, endurance, balance, coordination, reaction time, metabolism, and muscle mass. Flexibility exercise improves circulation, posture, and balance. Seek guidance from your physician and exercise physiologist before implementing an exercise routine and learn your capabilities and proper form for all exercise.  Introduction to Yoga  Clinical staff conducted group or individual video education with verbal and written material and guidebook.  Patient learns about yoga, a discipline of the coming together of mind, breath, and body. The benefits of yoga include improved flexibility, improved range of motion, better posture and core strength, increased lung function, weight loss, and positive self-image. Yoga's heart health benefits include lowered blood pressure, healthier heart rate, decreased cholesterol and triglyceride levels, improved immune function, and reduced stress. Seek guidance from your physician and exercise physiologist before implementing an exercise routine and learn your capabilities and proper form for all exercise.  Medical   Aging: Enhancing Your Quality of Life  Clinical staff conducted  group or individual video education with verbal and written material and guidebook.  Patient learns key strategies and recommendations to stay in good physical health and enhance quality of life, such as prevention strategies, having an advocate, securing a [place], and keeping a list of medications and system for tracking them. It also discusses how to avoid risk for bone loss.  Biology of Weight Control  Clinical staff conducted group or individual video education with verbal and written material and guidebook.  Patient learns that weight gain occurs because we consume more calories than we burn (eating more, moving less). Even if your body weight is normal, you may have higher ratios of fat compared to muscle mass. Too much body fat puts you at increased risk for cardiovascular disease, heart attack, stroke, type 2 diabetes, and obesity-related cancers. In addition to exercise, following the [place] can help reduce your risk.  Decoding Lab Results  Clinical staff conducted group or individual video education with verbal and written material and guidebook.  Patient learns that lab test reflects one measurement whose values change over time and are influenced by many factors, including medication, stress, sleep, exercise, food, hydration, pre-existing medical conditions, and more. It is recommended to use the knowledge from this video to become more involved with your lab results and evaluate your numbers to speak with your doctor.   Diseases of Our Time - Overview  Clinical staff conducted group or individual video education with verbal and written material and guidebook.  Patient learns that according to the CDC, 50% to 70% of chronic diseases (such as obesity, type 2 diabetes, elevated lipids, hypertension, and heart disease) are avoidable through lifestyle improvements including healthier food choices, listening to satiety cues, and increased physical  activity.  Sleep Disorders Clinical staff conducted group or individual video education with verbal and written material and guidebook.  Patient learns how good quality and duration of sleep are important to overall health and well-being. Patient also learns about sleep disorders and how they impact health along with recommendations to address them, including discussing with a physician.  Nutrition  Dining Out - Part 2 Clinical staff conducted group or individual video education with verbal and written material and guidebook.  Patient learns how to plan ahead and communicate in order to maximize their dining experience in a healthy and nutritious manner. Included are recommended food choices based on the type  of restaurant the patient is visiting.   Fueling a Best boy conducted group or individual video education with verbal and written material and guidebook.  There is a strong connection between our food choices and our health. Diseases like obesity and type 2 diabetes are very prevalent and are in large-part due to lifestyle choices. The Pritikin Eating Plan provides plenty of food and hunger-curbing satisfaction. It is easy to follow, affordable, and helps reduce health risks.  Menu Workshop  Clinical staff conducted group or individual video education with verbal and written material and guidebook.  Patient learns that restaurant meals can sabotage health goals because they are often packed with calories, fat, sodium, and sugar. Recommendations include strategies to plan ahead and to communicate with the manager, chef, or server to help order a healthier meal.  Planning Your Eating Strategy  Clinical staff conducted group or individual video education with verbal and written material and guidebook.  Patient learns about the [place] and its benefit of reducing the risk of disease. The [place] does not focus on calories. Instead, it emphasizes  high-quality, nutrient-rich foods. By knowing the characteristics of the foods, we choose, we can determine their calorie density and make informed decisions.  Targeting Your Nutrition Priorities  Clinical staff conducted group or individual video education with verbal and written material and guidebook.  Patient learns that lifestyle habits have a tremendous impact on disease risk and progression. This video provides eating and physical activity recommendations based on your personal health goals, such as reducing LDL cholesterol, losing weight, preventing or controlling type 2 diabetes, and reducing high blood pressure.  Vitamins and Minerals  Clinical staff conducted group or individual video education with verbal and written material and guidebook.  Patient learns different ways to obtain key vitamins and minerals, including through a recommended healthy diet. It is important to discuss all supplements you take with your doctor.   Healthy Mind-Set    Smoking Cessation  Clinical staff conducted group or individual video education with verbal and written material and guidebook.  Patient learns that cigarette smoking and tobacco addiction pose a serious health risk which affects millions of people. Stopping smoking will significantly reduce the risk of heart disease, lung disease, and many forms of cancer. Recommended strategies for quitting are covered, including working with your doctor to develop a successful plan.  Culinary   Becoming a Financial trader conducted group or individual video education with verbal and written material and guidebook.  Patient learns that cooking at home can be healthy, cost-effective, quick, and puts them in control. Keys to cooking healthy recipes will include looking at your recipe, assessing your equipment needs, planning ahead, making it simple, choosing cost-effective seasonal ingredients, and limiting the use of added fats, salts, and  sugars.  Cooking - Breakfast and Snacks  Clinical staff conducted group or individual video education with verbal and written material and guidebook.  Patient learns how important breakfast is to satiety and nutrition through the entire day. Recommendations include key foods to eat during breakfast to help stabilize blood sugar levels and to prevent overeating at meals later in the day. Planning ahead is also a key component.  Cooking - Human resources officer conducted group or individual video education with verbal and written material and guidebook.  Patient learns eating strategies to improve overall health, including an approach to cook more at home. Recommendations include thinking of animal protein as a  side on your plate rather than center stage and focusing instead on lower calorie dense options like vegetables, fruits, whole grains, and plant-based proteins, such as beans. Making sauces in large quantities to freeze for later and leaving the skin on your vegetables are also recommended to maximize your experience.  Cooking - Healthy Salads and Dressing Clinical staff conducted group or individual video education with verbal and written material and guidebook.  Patient learns that vegetables, fruits, whole grains, and legumes are the foundations of the [place]. Recommendations include how to incorporate each of these in flavorful and healthy salads, and how to create homemade salad dressings. Proper handling of ingredients is also covered. Cooking - Soups and [employer] - Soups and Desserts Clinical staff conducted group or individual video education with verbal and written material and guidebook.  Patient learns that Pritikin soups and desserts make for easy, nutritious, and delicious snacks and meal components that are low in sodium, fat, sugar, and calorie density, while high in vitamins, minerals, and filling fiber. Recommendations include simple and healthy  ideas for soups and desserts.   Overview     The Pritikin Solution Program Overview Clinical staff conducted group or individual video education with verbal and written material and guidebook.  Patient learns that the results of the [place] Program have been documented in more than 100 articles published in peer-reviewed journals, and the benefits include reducing risk factors for (and, in some cases, even reversing) high cholesterol, high blood pressure, type 2 diabetes, obesity, and more! An overview of the three key pillars of the Pritikin Program will be covered: eating well, doing regular exercise, and having a healthy mind-set.  WORKSHOPS  Exercise: Exercise Basics: Building Your Action Plan Clinical staff led group instruction and group discussion with PowerPoint presentation and patient guidebook. To enhance the learning environment the use of posters, models and videos may be added. At the conclusion of this workshop, patients will comprehend the difference between physical activity and exercise, as well as the benefits of incorporating both, into their routine. Patients will understand the FITT (Frequency, Intensity, Time, and Type) principle and how to use it to build an exercise action plan. In addition, safety concerns and other considerations for exercise and cardiac rehab will be addressed by the presenter. The purpose of this lesson is to promote a comprehensive and effective weekly exercise routine in order to improve patients' overall level of fitness.   Managing Heart Disease: Your Path to a Healthier Heart Clinical staff led group instruction and group discussion with PowerPoint presentation and patient guidebook. To enhance the learning environment the use of posters, models and videos may be added.At the conclusion of this workshop, patients will understand the anatomy and physiology of the heart. Additionally, they will understand how Pritikin's three pillars impact the  risk factors, the progression, and the management of heart disease.  The purpose of this lesson is to provide a high-level overview of the heart, heart disease, and how the Pritikin lifestyle positively impacts risk factors.  Exercise Biomechanics Clinical staff led group instruction and group discussion with PowerPoint presentation and patient guidebook. To enhance the learning environment the use of posters, models and videos may be added. Patients will learn how the structural parts of their bodies function and how these functions impact their daily activities, movement, and exercise. Patients will learn how to promote a neutral spine, learn how to manage pain, and identify ways to improve their physical movement in order to promote  healthy living. The purpose of this lesson is to expose patients to common physical limitations that impact physical activity. Participants will learn practical ways to adapt and manage aches and pains, and to minimize their effect on regular exercise. Patients will learn how to maintain good posture while sitting, walking, and lifting.  Balance Training and Fall Prevention  Clinical staff led group instruction and group discussion with PowerPoint presentation and patient guidebook. To enhance the learning environment the use of posters, models and videos may be added. At the conclusion of this workshop, patients will understand the importance of their sensorimotor skills (vision, proprioception, and the vestibular system) in maintaining their ability to balance as they age. Patients will apply a variety of balancing exercises that are appropriate for their current level of function. Patients will understand the common causes for poor balance, possible solutions to these problems, and ways to modify their physical environment in order to minimize their fall risk. The purpose of this lesson is to teach patients about the importance of maintaining balance as they age  and ways to minimize their risk of falling.  WORKSHOPS   Nutrition:  Fueling a Scientist, research (physical sciences) led group instruction and group discussion with PowerPoint presentation and patient guidebook. To enhance the learning environment the use of posters, models and videos may be added. Patients will review the foundational principles of the [place] and understand what constitutes a serving size in each of the food groups. Patients will also learn Pritikin-friendly foods that are better choices when away from home and review make-ahead meal and snack options. Calorie density will be reviewed and applied to three nutrition priorities: weight maintenance, weight loss, and weight gain. The purpose of this lesson is to reinforce (in a group setting) the key concepts around what patients are recommended to eat and how to apply these guidelines when away from home by planning and selecting Pritikin-friendly options. Patients will understand how calorie density may be adjusted for different weight management goals.  Mindful Eating  Clinical staff led group instruction and group discussion with PowerPoint presentation and patient guidebook. To enhance the learning environment the use of posters, models and videos may be added. Patients will briefly review the concepts of the [place] and the importance of low-calorie dense foods. The concept of mindful eating will be introduced as well as the importance of paying attention to internal hunger signals. Triggers for non-hunger eating and techniques for dealing with triggers will be explored. The purpose of this lesson is to provide patients with the opportunity to review the basic principles of the [place], discuss the value of eating mindfully and how to measure internal cues of hunger and fullness using the Hunger Scale. Patients will also discuss reasons for non-hunger eating and learn strategies to use for controlling  emotional eating.  Targeting Your Nutrition Priorities Clinical staff led group instruction and group discussion with PowerPoint presentation and patient guidebook. To enhance the learning environment the use of posters, models and videos may be added. Patients will learn how to determine their genetic susceptibility to disease by reviewing their family history. Patients will gain insight into the importance of diet as part of an overall healthy lifestyle in mitigating the impact of genetics and other environmental insults. The purpose of this lesson is to provide patients with the opportunity to assess their personal nutrition priorities by looking at their family history, their own health history and current risk factors. Patients will also be  able to discuss ways of prioritizing and modifying the [place] for their highest risk areas  Menu  Clinical staff led group instruction and group discussion with PowerPoint presentation and patient guidebook. To enhance the learning environment the use of posters, models and videos may be added. Using menus brought in from [employer], or printed from [employer], patients will apply the [place] dining out guidelines that were presented in the [employer] video. Patients will also be able to practice these guidelines in a variety of provided scenarios. The purpose of this lesson is to provide patients with the opportunity to practice hands-on learning of the [place] with actual menus and practice scenarios.  Label Reading Clinical staff led group instruction and group discussion with PowerPoint presentation and patient guidebook. To enhance the learning environment the use of posters, models and videos may be added. Patients will review and discuss the Pritikin label reading guidelines presented in Pritikin's Label Reading Educational series video. Using fool labels brought in from local grocery stores  and markets, patients will apply the label reading guidelines and determine if the packaged food meet the Pritikin guidelines. The purpose of this lesson is to provide patients with the opportunity to review, discuss, and practice hands-on learning of the Pritikin Label Reading guidelines with actual packaged food labels. [place] Workshops are designed to teach patients ways to prepare quick, simple, and affordable recipes at home. The importance of nutrition's role in chronic disease risk reduction is reflected in its emphasis in the overall Pritikin program. By learning how to prepare essential core Pritikin Eating Plan recipes, patients will increase control over what they eat; be able to customize the flavor of foods without the use of added salt, sugar, or fat; and improve the quality of the food they consume. By learning a set of core recipes which are easily assembled, quickly prepared, and affordable, patients are more likely to prepare more healthy foods at home. These workshops focus on convenient breakfasts, simple entres, side dishes, and desserts which can be prepared with minimal effort and are consistent with nutrition recommendations for cardiovascular risk reduction. Cooking [employer] are taught by a Engineer, materials (RD) who has been trained by the [employer]. The chef or RD has a clear understanding of the importance of minimizing - if not completely eliminating - added fat, sugar, and sodium in recipes. Throughout the series of [place] Workshop sessions, patients will learn about healthy ingredients and efficient methods of cooking to build confidence in their capability to prepare    Cooking School weekly topics:  Adding Flavor- Sodium-Free  Fast and Healthy Breakfasts  Powerhouse Plant-Based Proteins  Satisfying Salads and Dressings  Simple Sides and Sauces  International Cuisine-Spotlight on the [place]  Zones  Delicious Desserts  Savory Soups  Efficiency Cooking - Meals in a Snap  Tasty Appetizers and Snacks  Comforting Weekend Breakfasts  One-Pot Wonders   Fast Evening Meals  Easy Paint (Psychosocial): New Thoughts, New Behaviors Clinical staff led group instruction and group discussion with PowerPoint presentation and patient guidebook. To enhance the learning environment the use of posters, models and videos may be added. Patients will learn and practice techniques for developing effective health and lifestyle goals. Patients will be able to effectively apply the goal setting process learned to develop at least one new personal goal.  The  purpose of this lesson is to expose patients to a new skill set of behavior modification techniques such as techniques setting SMART goals, overcoming barriers, and achieving new thoughts and new behaviors.  Managing Moods and Relationships Clinical staff led group instruction and group discussion with PowerPoint presentation and patient guidebook. To enhance the learning environment the use of posters, models and videos may be added. Patients will learn how emotional and chronic stress factors can impact their health and relationships. They will learn healthy ways to manage their moods and utilize positive coping mechanisms. In addition, ICR patients will learn ways to improve communication skills. The purpose of this lesson is to expose patients to ways of understanding how one's mood and health are intimately connected. Developing a healthy outlook can help build positive relationships and connections with others. Patients will understand the importance of utilizing effective communication skills that include actively listening and being heard. They will learn and understand the importance of the "4 Cs" and especially Connections in fostering of a Healthy Mind-Set.  Healthy Sleep for  a Healthy Heart Clinical staff led group instruction and group discussion with PowerPoint presentation and patient guidebook. To enhance the learning environment the use of posters, models and videos may be added. At the conclusion of this workshop, patients will be able to demonstrate knowledge of the importance of sleep to overall health, well-being, and quality of life. They will understand the symptoms of, and treatments for, common sleep disorders. Patients will also be able to identify daytime and nighttime behaviors which impact sleep, and they will be able to apply these tools to help manage sleep-related challenges. The purpose of this lesson is to provide patients with a general overview of sleep and outline the importance of quality sleep. Patients will learn about a few of the most common sleep disorders. Patients will also be introduced to the concept of "sleep hygiene," and discover ways to self-manage certain sleeping problems through simple daily behavior changes. Finally, the workshop will motivate patients by clarifying the links between quality sleep and their goals of heart-healthy living.   Recognizing and Reducing Stress Clinical staff led group instruction and group discussion with PowerPoint presentation and patient guidebook. To enhance the learning environment the use of posters, models and videos may be added. At the conclusion of this workshop, patients will be able to understand the types of stress reactions, differentiate between acute and chronic stress, and recognize the impact that chronic stress has on their health. They will also be able to apply different coping mechanisms, such as reframing negative self-talk. Patients will have the opportunity to practice a variety of stress management techniques, such as deep abdominal breathing, progressive muscle relaxation, and/or guided imagery.  The purpose of this lesson is to educate patients on the role of stress in their lives and  to provide healthy techniques for coping with it.  Learning Barriers/Preferences:  Learning Barriers/Preferences - 09/22/22 1025       Learning Barriers/Preferences   Learning Barriers None    Learning Preferences Audio;Written Material;Computer/Internet;Group Instruction;Individual Instruction;Pictoral;Skilled Demonstration;Verbal Instruction;Video             Education Topics:  Knowledge Questionnaire Score:  Knowledge Questionnaire Score - 09/22/22 1022       Knowledge Questionnaire Score   Pre Score 26/28             Core Components/Risk Factors/Patient Goals at Admission:  Personal Goals and Risk Factors at Admission - 09/22/22 1019  Core Components/Risk Factors/Patient Goals on Admission    Weight Management Yes;Weight Maintenance    Intervention Weight Management: Develop a combined nutrition and exercise program designed to reach desired caloric intake, while maintaining appropriate intake of nutrient and fiber, sodium and fats, and appropriate energy expenditure required for the weight goal.;Weight Management: Provide education and appropriate resources to help participant work on and attain dietary goals.    Expected Outcomes Long Term: Adherence to nutrition and physical activity/exercise program aimed toward attainment of established weight goal;Weight Maintenance: Understanding of the daily nutrition guidelines, which includes 25-35% calories from fat, 7% or less cal from saturated fats, less than '200mg'$  cholesterol, less than 1.5gm of sodium, & 5 or more servings of fruits and vegetables daily    Tobacco Cessation Yes    Intervention Assist the participant in steps to quit. Provide individualized education and counseling about committing to Tobacco Cessation, relapse prevention, and pharmacological support that can be provided by physician.;Advice worker, assist with locating and accessing local/national Quit Smoking programs, and support quit  date choice.    Expected Outcomes Short Term: Will demonstrate readiness to quit, by selecting a quit date.;Short Term: Will quit all tobacco product use, adhering to prevention of relapse plan.;Long Term: Complete abstinence from all tobacco products for at least 12 months from quit date.    Lipids Yes    Intervention Provide education and support for participant on nutrition & aerobic/resistive exercise along with prescribed medications to achieve LDL '70mg'$ , HDL >'40mg'$ .    Expected Outcomes Short Term: Participant states understanding of desired cholesterol values and is compliant with medications prescribed. Participant is following exercise prescription and nutrition guidelines.;Long Term: Cholesterol controlled with medications as prescribed, with individualized exercise RX and with personalized nutrition plan. Value goals: LDL < '70mg'$ , HDL > 40 mg.    Stress Yes    Intervention Offer individual and/or small group education and counseling on adjustment to heart disease, stress management and health-related lifestyle change. Teach and support self-help strategies.;Refer participants experiencing significant psychosocial distress to appropriate mental health specialists for further evaluation and treatment. When possible, include family members and significant others in education/counseling sessions.    Expected Outcomes Short Term: Participant demonstrates changes in health-related behavior, relaxation and other stress management skills, ability to obtain effective social support, and compliance with psychotropic medications if prescribed.;Long Term: Emotional wellbeing is indicated by absence of clinically significant psychosocial distress or social isolation.             Core Components/Risk Factors/Patient Goals Review:   Goals and Risk Factor Review     Row Name 10/02/22 1620 11/02/22 0945           Core Components/Risk Factors/Patient Goals Review   Personal Goals Review Weight  Management/Obesity;Hypertension;Lipids;Tobacco Cessation Weight Management/Obesity;Hypertension;Lipids;Tobacco Cessation      Review [name] started intensive cardiac rehab on 10/02/21 and did well with exercise. Vital signs were stable [name] is doing wel  well with exercise at intensive cardiac rehab.. Vital signs remain stable. Systolic resting BP's smetimes in the 90's, asymptomatic. [name] has lost 3.3 kg since starting cardiac rehab.      Expected Outcomes [name] will continue to participate in intensive cardiac rehab for exercise, nutrition and lifestyle modifications [name] will continue to participate in intensive cardiac rehab for exercise, nutrition and lifestyle modifications               Core Components/Risk Factors/Patient Goals at Discharge (Final Review):   Goals and Risk Factor Review - 11/02/22  0945       Core Components/Risk Factors/Patient Goals Review   Personal Goals Review Weight Management/Obesity;Hypertension;Lipids;Tobacco Cessation    Review [name] is doing wel  well with exercise at intensive cardiac rehab.. Vital signs remain stable. Systolic resting BP's smetimes in the 90's, asymptomatic. [name] has lost 3.3 kg since starting cardiac rehab.    Expected Outcomes [name] will continue to participate in intensive cardiac rehab for exercise, nutrition and lifestyle modifications             ITP Comments:  ITP Comments     Row Name 09/22/22 0816 10/02/22 1440 11/02/22 0943       ITP Comments Fransico Him, MD; Medical Director.  Introduction to [facility]ritikin Education Program/Intensive Cardiac Rehab.  Initial Orientation packet reviewed with patient. 30 Day ITP Review. [name] started intensive cardiac rehab on 10/02/22 and did well with exercise. 30 Day ITP Review. [name] has good attendance and participation in  intensive cardiac rehab              Comments: See ITP comments.Harrell Gave RN BSN

## 2022-11-04 ENCOUNTER — Ambulatory Visit (HOSPITAL_COMMUNITY): Payer: BC Managed Care – PPO

## 2022-11-04 ENCOUNTER — Encounter (HOSPITAL_COMMUNITY)
Admission: RE | Admit: 2022-11-04 | Discharge: 2022-11-04 | Disposition: A | Discharge status: Home / Self Care | Payer: BC Managed Care – PPO | Source: Ambulatory Visit | Attending: Interventional Cardiology | Admitting: Interventional Cardiology

## 2022-11-04 DIAGNOSIS — Z955 Presence of coronary angioplasty implant and graft: Secondary | ICD-10-CM

## 2022-11-04 DIAGNOSIS — I2111 ST elevation (STEMI) myocardial infarction involving right coronary artery: Secondary | ICD-10-CM

## 2022-11-06 ENCOUNTER — Encounter (HOSPITAL_COMMUNITY): Payer: BC Managed Care – PPO

## 2022-11-06 ENCOUNTER — Ambulatory Visit (HOSPITAL_COMMUNITY): Payer: BC Managed Care – PPO

## 2022-11-08 NOTE — Progress Notes (Unsigned)
Cardiology Office Note   Date:  11/09/2022   ID:  [name], DOB [birth year], MRN [id number]  PCP:  Darreld Mclean, MD    No chief complaint on file.  CAD  Wt Readings from Last 3 Encounters:  11/09/22 148 lb 9.6 oz (67.4 kg)  09/28/22 142 lb 6.4 oz (64.6 kg)  09/22/22 145 lb 4.5 oz (65.9 kg)       History of Present Illness: [name] is a 57 y.o. female  former patient of Dr. Tamala Julian with a hx of  CAD s/p STEMI treated with PCI/DES to RCA in 06/2022, HLD, tobacco abuse, prediabetes, history of DVT, and anemia.  Angina during STEMI with teeth pain. No chest pain.    In 11/23: "Having a difficult time sleeping.  Started having lower back discomfort within the past 2 weeks.  Currently on Zetia and high intensity atorvastatin. May need to temporarily discontinue these medications to see if they are causing back pain and difficulty sleeping. Will make a dose reduction at the very least based upon findings from laboratory data today.  She is suffering insomnia, muscle aches and pains, and anxiety about her mortality. We had discussion concerning the frequency of depression after myocardial infarction and coronary bypass surgery. I think that she is clearly in a mode of situational depression and anxiety. There may also be medication component that is causing insomnia so therefore we will discontinue the very low-dose beta-blocker. May also give her a pause from antilipid therapy depending upon findings of today's labs."  Elevated LFTs with statin which normalized when statin was stopped.  Feels better off of statin.   Denies : Chest pain. Dizziness. Leg edema. Nitroglycerin use. Orthopnea. Palpitations. Paroxysmal nocturnal dyspnea. Shortness of breath. Syncope.    Finishing cardiac rehab.   Past Medical History:  Diagnosis Date   Acute ST elevation myocardial infarction (STEMI) involving right coronary artery ([place]) 06/24/2022   100% dRCA => DES PCI =.> Synergy XD DES  2.75 x 16-3.0 mm postdilation), reduced to 0%.   Anemia    Chicken pox    Coronary artery disease involving native coronary artery of native heart with unstable angina pectoris ([place]) 06/24/2022   Inf STEMI =>Cardiac Cath-PCI 06/24/2022: RIGHT DOMINANT.  CULPRIT LESION-distal RCA 100% (left to right collaterals) => DES PCI (Synergy XD DES 2.75 x 16-3.0 mm postdilation), reduced to 0%.  60% proximal RCA.  Diffuse 35% proximal LAD.  Normal LCx.   Left leg DVT [facility]) 2014   Uterine leiomyoma     Past Surgical History:  Procedure Laterality Date   BUNIONECTOMY Bilateral 09/07/2005   CARDIAC CATHETERIZATION     CESAREAN SECTION     x 2   CORONARY/GRAFT ACUTE MI REVASCULARIZATION N/A 06/24/2022   Procedure: Coronary/Graft Acute MI Revascularization;  Surgeon: Belva Crome, MD;  Location: [facility];  Service: Cardiovascular;  Laterality: N/A;   ROBOTIC ASSISTED TOTAL HYSTERECTOMY WITH BILATERAL SALPINGO OOPHERECTOMY N/A 03/03/2016   Procedure: XI ROBOTIC ASSISTED TOTAL HYSTERECTOMY WITH BILATERAL Salipingectomy ;  Surgeon: Everitt Amber, MD;  Location: [facility];  Service: Gynecology;  Laterality: N/A;   TUBAL LIGATION       Current Outpatient Medications  Medication Sig Dispense Refill   aspirin EC 81 MG tablet Take 1 tablet (81 mg total) by mouth daily. Swallow whole. 30 tablet 3   Evolocumab (REPATHA SURECLICK) [id number] MG/ML SOAJ Inject 140 mg into the skin every 14 (fourteen) days. 6 mL 3   melatonin 5  MG TABS Take 10 mg by mouth at bedtime.     Multiple Vitamins-Minerals (ONE-A-DAY FOR HER VITACRAVES) CHEW Chew 1 tablet by mouth in the morning.     nitroGLYCERIN (NITROSTAT) 0.4 MG SL tablet Place 1 tablet (0.4 mg total) under the tongue every 5 (five) minutes as needed. 25 tablet 2   ticagrelor (BRILINTA) 90 MG TABS tablet Take 1 tablet (90 mg total) by mouth 2 (two) times daily. 60 tablet 11   No current facility-administered medications for this visit.    Allergies:   Lipitor  [atorvastatin]    Social History:  The patient  reports that she quit smoking about 2 years ago. Her smoking use included cigarettes. She has a 3.75 pack-year smoking history. She has never used smokeless tobacco. She reports that she does not drink alcohol and does not use drugs.   Family History:  The patient's family history includes Depression in her mother; Diabetes in her maternal grandmother and mother; Heart disease in her maternal grandfather; Stroke in her mother; Tuberculosis in her paternal grandfather.    ROS:  Please see the history of present illness.   Otherwise, review of systems are positive for occasional indigestion depending on dietary choices.   All other systems are reviewed and negative.    PHYSICAL EXAM: VS:  BP 130/80   Pulse 71   Ht '5\' 6"'$  (1.676 m)   Wt 148 lb 9.6 oz (67.4 kg)   LMP 03/01/2016   SpO2 99%   BMI 23.98 kg/m  , BMI Body mass index is 23.98 kg/m. GEN: Well nourished, well developed, in no acute distress HEENT: normal Neck: no JVD, carotid bruits, or masses Cardiac: RRR; no murmurs, rubs, or gallops,no edema  Respiratory:  clear to auscultation bilaterally, normal work of breathing GI: soft, nontender, nondistended, + BS MS: no deformity or atrophy Skin: warm and dry, no rash Neuro:  Strength and sensation are intact Psych: euthymic mood, full affect   Recent Labs: 06/25/2022: Magnesium 2.0 08/08/2022: BUN 7; Creatinine, Ser 0.93; Hemoglobin 11.9; Platelets 435; Potassium 3.9; Sodium 139 08/12/2022: TSH 1.34 09/24/2022: ALT 17   Lipid Panel    Component Value Date/Time   CHOL 116 08/06/2022 1214   TRIG 47 08/06/2022 1214   HDL 56 08/06/2022 1214   CHOLHDL 2.1 08/06/2022 1214   CHOLHDL 4.1 06/24/2022 0652   VLDL 11 06/24/2022 0652   LDLCALC 49 08/06/2022 1214     Other studies Reviewed: Additional studies/ records that were reviewed today with results demonstrating: labs reviewed- LFTs improved on recheck.   ASSESSMENT AND  PLAN:  CAD/Old MI: s/p RCA stent.  No angina.  Continue aggressive secondary prevention.  No bleeding issues with Brilinta.  In October 2024, change Brilinta to clopidogrel.   Tobacco abuse: Able to quit smoking since 12/23.  Bought nicorette gum but saw long list of side effects. Feels that cardiac rehab helped as well.    Hyperlipidemia: Elevated LFTs with statin which normalized when statin was stopped.  Tolerating repatha. Will have lipids checked in the near future.  Feels better on repatha. Whole food, plant based diet recommended.    Current medicines are reviewed at length with the patient today.  The patient concerns regarding her medicines were addressed.  The following changes have been made:  No change  Labs/ tests ordered today include:  No orders of the defined types were placed in this encounter.   Recommend 150 minutes/week of aerobic exercise Low fat, low carb, high fiber diet  recommended  Disposition:   FU in 10/24   Signed, Larae Grooms, MD  11/09/2022 11:57 AM    [facility] [address], [place], [place]  [address] Phone: [phone]; Fax: [phone]

## 2022-11-09 ENCOUNTER — Ambulatory Visit: Payer: BC Managed Care – PPO | Attending: Interventional Cardiology | Admitting: Interventional Cardiology

## 2022-11-09 ENCOUNTER — Encounter: Payer: Self-pay | Admitting: Interventional Cardiology

## 2022-11-09 ENCOUNTER — Encounter (HOSPITAL_COMMUNITY)
Admission: RE | Admit: 2022-11-09 | Discharge: 2022-11-09 | Disposition: A | Discharge status: Home / Self Care | Payer: BC Managed Care – PPO | Source: Ambulatory Visit | Attending: Interventional Cardiology | Admitting: Interventional Cardiology

## 2022-11-09 ENCOUNTER — Ambulatory Visit (HOSPITAL_COMMUNITY): Payer: BC Managed Care – PPO

## 2022-11-09 VITALS — BP 130/80 | HR 71 | Ht 66.0 in | Wt 148.6 lb

## 2022-11-09 DIAGNOSIS — I25118 Atherosclerotic heart disease of native coronary artery with other forms of angina pectoris: Secondary | ICD-10-CM

## 2022-11-09 DIAGNOSIS — Z955 Presence of coronary angioplasty implant and graft: Secondary | ICD-10-CM | POA: Insufficient documentation

## 2022-11-09 DIAGNOSIS — I2111 ST elevation (STEMI) myocardial infarction involving right coronary artery: Secondary | ICD-10-CM

## 2022-11-09 DIAGNOSIS — I252 Old myocardial infarction: Secondary | ICD-10-CM | POA: Diagnosis not present

## 2022-11-09 DIAGNOSIS — Z720 Tobacco use: Secondary | ICD-10-CM | POA: Diagnosis not present

## 2022-11-09 DIAGNOSIS — Z48812 Encounter for surgical aftercare following surgery on the circulatory system: Secondary | ICD-10-CM | POA: Diagnosis not present

## 2022-11-09 DIAGNOSIS — E785 Hyperlipidemia, unspecified: Secondary | ICD-10-CM

## 2022-11-09 NOTE — Patient Instructions (Signed)
Medication Instructions:  Your physician recommends that you continue on your current medications as directed. Please refer to the Current Medication list given to you today.  *If you need a refill on your cardiac medications before your next appointment, please call your pharmacy*   Lab Work: none If you have labs (blood work) drawn today and your tests are completely normal, you will receive your results only by: [place] (if you have MyChart) OR A paper copy in the mail If you have any lab test that is abnormal or we need to change your treatment, we will call you to review the results.   Testing/Procedures: none   Follow-Up: At [facility], you and your health needs are our priority.  As part of our continuing mission to provide you with exceptional heart care, we have created designated Provider Care Teams.  These Care Teams include your primary Cardiologist (physician) and Advanced Practice Providers (APPs -  Physician Assistants and Nurse Practitioners) who all work together to provide you with the care you need, when you need it.  We recommend signing up for the patient portal called "MyChart".  Sign up information is provided on this After Visit Summary.  MyChart is used to connect with patients for Virtual Visits (Telemedicine).  Patients are able to view lab/test results, encounter notes, upcoming appointments, etc.  Non-urgent messages can be sent to your provider as well.   To learn more about what you can do with MyChart, go to [email or web address].    Your next appointment:   October 2024  Provider:   Dr Irish Lack    Other Instructions

## 2022-11-11 ENCOUNTER — Encounter (HOSPITAL_COMMUNITY)
Admission: RE | Admit: 2022-11-11 | Discharge: 2022-11-11 | Disposition: A | Discharge status: Home / Self Care | Payer: BC Managed Care – PPO | Source: Ambulatory Visit | Attending: Interventional Cardiology | Admitting: Interventional Cardiology

## 2022-11-11 ENCOUNTER — Ambulatory Visit (HOSPITAL_COMMUNITY): Payer: BC Managed Care – PPO

## 2022-11-11 DIAGNOSIS — Z955 Presence of coronary angioplasty implant and graft: Secondary | ICD-10-CM

## 2022-11-11 DIAGNOSIS — I2111 ST elevation (STEMI) myocardial infarction involving right coronary artery: Secondary | ICD-10-CM

## 2022-11-13 ENCOUNTER — Ambulatory Visit (HOSPITAL_COMMUNITY): Payer: BC Managed Care – PPO

## 2022-11-13 ENCOUNTER — Encounter (HOSPITAL_COMMUNITY)
Admission: RE | Admit: 2022-11-13 | Discharge: 2022-11-13 | Disposition: A | Discharge status: Home / Self Care | Payer: BC Managed Care – PPO | Source: Ambulatory Visit | Attending: Interventional Cardiology | Admitting: Interventional Cardiology

## 2022-11-13 DIAGNOSIS — I2111 ST elevation (STEMI) myocardial infarction involving right coronary artery: Secondary | ICD-10-CM

## 2022-11-13 DIAGNOSIS — Z955 Presence of coronary angioplasty implant and graft: Secondary | ICD-10-CM | POA: Diagnosis not present

## 2022-11-16 ENCOUNTER — Encounter (HOSPITAL_COMMUNITY)
Admission: RE | Admit: 2022-11-16 | Discharge: 2022-11-16 | Disposition: A | Discharge status: Home / Self Care | Payer: BC Managed Care – PPO | Source: Ambulatory Visit | Attending: Interventional Cardiology | Admitting: Interventional Cardiology

## 2022-11-16 DIAGNOSIS — I2111 ST elevation (STEMI) myocardial infarction involving right coronary artery: Secondary | ICD-10-CM

## 2022-11-16 DIAGNOSIS — Z955 Presence of coronary angioplasty implant and graft: Secondary | ICD-10-CM | POA: Diagnosis not present

## 2022-11-18 ENCOUNTER — Encounter (HOSPITAL_COMMUNITY)
Admission: RE | Admit: 2022-11-18 | Discharge: 2022-11-18 | Disposition: A | Discharge status: Home / Self Care | Payer: BC Managed Care – PPO | Source: Ambulatory Visit | Attending: Interventional Cardiology | Admitting: Interventional Cardiology

## 2022-11-18 ENCOUNTER — Other Ambulatory Visit: Payer: BC Managed Care – PPO

## 2022-11-18 VITALS — Ht 68.75 in | Wt 145.3 lb

## 2022-11-18 DIAGNOSIS — Z955 Presence of coronary angioplasty implant and graft: Secondary | ICD-10-CM | POA: Diagnosis not present

## 2022-11-18 DIAGNOSIS — I2111 ST elevation (STEMI) myocardial infarction involving right coronary artery: Secondary | ICD-10-CM

## 2022-11-20 ENCOUNTER — Encounter (HOSPITAL_COMMUNITY)
Admission: RE | Admit: 2022-11-20 | Discharge: 2022-11-20 | Disposition: A | Discharge status: Home / Self Care | Payer: BC Managed Care – PPO | Source: Ambulatory Visit | Attending: Interventional Cardiology | Admitting: Interventional Cardiology

## 2022-11-20 DIAGNOSIS — I2111 ST elevation (STEMI) myocardial infarction involving right coronary artery: Secondary | ICD-10-CM

## 2022-11-20 DIAGNOSIS — Z955 Presence of coronary angioplasty implant and graft: Secondary | ICD-10-CM | POA: Diagnosis not present

## 2022-11-20 NOTE — Progress Notes (Signed)
Discharge Progress Report  Patient Details  Name: [name] MRN: [id number] Date of Birth: 03/05/1966 Referring Provider:   Flowsheet Row INTENSIVE CARDIAC REHAB ORIENT from 09/22/2022 in [facility]  Referring Provider Larae Grooms, MD        Number of Visits: 40  Reason for Discharge:  Patient reached a stable level of exercise. Patient independent in their exercise. Patient has met program and personal goals.  Smoking History:  Social History   Tobacco Use  Smoking Status Former   Packs/day: 0.25   Years: 15.00   Additional pack years: 0.00   Total pack years: 3.75   Types: Cigarettes   Quit date: 2022   Years since quitting: 2.2  Smokeless Tobacco Never    Diagnosis:  06/24/2022 STEMI  06/24/22 Status post insertion of drug eluting coronary artery stent/RCA  ADL [facility]:   Initial Exercise Prescription:  Initial Exercise Prescription - 09/22/22 0900       Date of Initial Exercise RX and Referring Provider   Date 09/22/22    Referring Provider Larae Grooms, MD    Expected Discharge Date 11/20/22      Arm Ergometer   Level 1.3    Watts 15    RPM 60    Minutes 15    METs 3.8      Recumbant Elliptical   Level 2    RPM 60    Watts 25    Minutes 15    METs 3.8      Prescription Details   Frequency (times per week) 3    Duration Progress to 30 minutes of continuous aerobic without signs/symptoms of physical distress      Intensity   THRR 40-80% of Max Heartrate 66 - 132    Ratings of Perceived Exertion 11-13    Perceived Dyspnea 0-4      Progression   Progression Continue progressive overload as per policy without signs/symptoms or physical distress.      Resistance Training   Training Prescription Yes    Weight 3 lbs    Reps 10-15             Discharge Exercise Prescription (Final Exercise Prescription Changes):  Exercise Prescription Changes - 11/24/22 1500        Response to Exercise   Blood Pressure (Admit) --    Blood Pressure (Exercise) --    Blood Pressure (Exit) --    Heart Rate (Admit) --    Heart Rate (Exercise) --    Heart Rate (Exit) --    Rating of Perceived Exertion (Exercise) --    Symptoms --    Comments --    Duration --    Intensity --      Progression   Progression --    Average METs --      Resistance Training   Training Prescription --    Weight --    Reps --    Time --      Interval Training   Interval Training --      Arm Ergometer   Level --    Watts --    RPM --    Minutes --    METs --      Recumbant Elliptical   Level --    RPM --    Watts --    Minutes --    METs --      Home Exercise Plan   Plans to  continue exercise at --    Frequency --    Initial Home Exercises Provided --             Functional Capacity:  6 Minute Walk     Row Name 09/22/22 0916 11/13/22 0918       6 Minute Walk   Phase Initial Discharge    Distance 1263 feet 1386 feet    Distance Feet Change -- 123 ft    Walk Time 6 minutes 6 minutes    # of Rest Breaks 0 0    MPH 2.4 2.63    METS 3.8 4.03    RPE 7 9    Perceived Dyspnea  0 0    VO2 Peak 13.3 14.1    Symptoms No No    Resting HR 87 bpm 83 bpm    Resting BP 112/82 104/72    Resting Oxygen Saturation  100 % --    Exercise Oxygen Saturation  during 6 min walk 100 % --    Max Ex. HR 82 bpm 84 bpm    Max Ex. BP 128/80 132/80    2 Minute Post BP 114/78 --             Psychological, QOL, Others - Outcomes: PHQ 2/9:    11/20/2022    9:38 AM 09/22/2022   10:28 AM  Depression screen PHQ 2/9  Decreased Interest 0 0  Down, Depressed, Hopeless 0 0  PHQ - 2 Score 0 0  Altered sleeping  1  Tired, decreased energy  0  Change in appetite  0  Feeling bad or failure about yourself   0  Trouble concentrating  0  Moving slowly or fidgety/restless  0  Suicidal thoughts  0  PHQ-9 Score  1  Difficult doing work/chores Not difficult at all  Somewhat difficult    Quality of Life:  Quality of Life - 11/20/22 1551       Quality of Life Scores   Health/Function Pre 28.33 %    Health/Function Post 28.93 %    Health/Function % Change 2.12 %    Socioeconomic Pre 30 %    Socioeconomic Post 30 %    Socioeconomic % Change  0 %    Psych/Spiritual Pre 30 %    Psych/Spiritual Post 30 %    Psych/Spiritual % Change 0 %    Family Pre 30 %    Family Post 30 %    Family % Change 0 %    GLOBAL Pre 29.29 %    GLOBAL Post 29.53 %    GLOBAL % Change 0.82 %             Personal Goals: Goals established at orientation with interventions provided to work toward goal.  Personal Goals and Risk Factors at Admission - 09/22/22 1019       Core Components/Risk Factors/Patient Goals on Admission    Weight Management Yes;Weight Maintenance    Intervention Weight Management: Develop a combined nutrition and exercise program designed to reach desired caloric intake, while maintaining appropriate intake of nutrient and fiber, sodium and fats, and appropriate energy expenditure required for the weight goal.;Weight Management: Provide education and appropriate resources to help participant work on and attain dietary goals.    Expected Outcomes Long Term: Adherence to nutrition and physical activity/exercise program aimed toward attainment of established weight goal;Weight Maintenance: Understanding of the daily nutrition guidelines, which includes 25-35% calories from fat, 7% or less cal from saturated fats, less  than 200mg  cholesterol, less than 1.5gm of sodium, & 5 or more servings of fruits and vegetables daily    Tobacco Cessation Yes    Intervention Assist the participant in steps to quit. Provide individualized education and counseling about committing to Tobacco Cessation, relapse prevention, and pharmacological support that can be provided by physician.;Advice worker, assist with locating and accessing local/national Quit  Smoking programs, and support quit date choice.    Expected Outcomes Short Term: Will demonstrate readiness to quit, by selecting a quit date.;Short Term: Will quit all tobacco product use, adhering to prevention of relapse plan.;Long Term: Complete abstinence from all tobacco products for at least 12 months from quit date.    Lipids Yes    Intervention Provide education and support for participant on nutrition & aerobic/resistive exercise along with prescribed medications to achieve LDL 70mg , HDL >40mg .    Expected Outcomes Short Term: Participant states understanding of desired cholesterol values and is compliant with medications prescribed. Participant is following exercise prescription and nutrition guidelines.;Long Term: Cholesterol controlled with medications as prescribed, with individualized exercise RX and with personalized nutrition plan. Value goals: LDL < 70mg , HDL > 40 mg.    Stress Yes    Intervention Offer individual and/or small group education and counseling on adjustment to heart disease, stress management and health-related lifestyle change. Teach and support self-help strategies.;Refer participants experiencing significant psychosocial distress to appropriate mental health specialists for further evaluation and treatment. When possible, include family members and significant others in education/counseling sessions.    Expected Outcomes Short Term: Participant demonstrates changes in health-related behavior, relaxation and other stress management skills, ability to obtain effective social support, and compliance with psychotropic medications if prescribed.;Long Term: Emotional wellbeing is indicated by absence of clinically significant psychosocial distress or social isolation.              Personal Goals Discharge:  Goals and Risk Factor Review     Row Name 10/02/22 1620 11/02/22 0945 11/20/22 0950         Core Components/Risk Factors/Patient Goals Review   Personal Goals  Review Weight Management/Obesity;Hypertension;Lipids;Tobacco Cessation Weight Management/Obesity;Hypertension;Lipids;Tobacco Cessation Weight Management/Obesity;Hypertension;Lipids;Tobacco Cessation     Review [name] started intensive cardiac rehab on 10/02/21 and did well with exercise. Vital signs were stable [name] is doing wel  well with exercise at intensive cardiac rehab.. Vital signs remain stable. Systolic resting BP's smetimes in the 90's, asymptomatic. [name] has lost 3.3 kg since starting cardiac rehab. [name] is doing wel  well with exercise at intensive cardiac rehab.. Vital signs remain stable. Jeannene Patella has lost 3.3 kg since starting cardiac rehab. [name] completes cardiac rehab on 11/20/22. [name] continues to abstain from smoking.     Expected Outcomes [name] will continue to participate in intensive cardiac rehab for exercise, nutrition and lifestyle modifications [name] will continue to participate in intensive cardiac rehab for exercise, nutrition and lifestyle modifications [name] will continue to participate in intensive cardiac rehab for exercise, nutrition and lifestyle modifications              Exercise Goals and Review:  Exercise Goals     Row Name 09/22/22 0949             Exercise Goals   Increase Physical Activity Yes       Intervention Provide advice, education, support and counseling about physical activity/exercise needs.;Develop an individualized exercise prescription for aerobic and resistive training based on initial evaluation findings, risk stratification, comorbidities and participant's personal goals.  Expected Outcomes Short Term: Attend rehab on a regular basis to increase amount of physical activity.;Long Term: Add in home exercise to make exercise part of routine and to increase amount of physical activity.;Long Term: Exercising regularly at least 3-5 days a week.       Increase Strength and Stamina Yes       Intervention Provide advice, education, support and counseling  about physical activity/exercise needs.;Develop an individualized exercise prescription for aerobic and resistive training based on initial evaluation findings, risk stratification, comorbidities and participant's personal goals.       Expected Outcomes Short Term: Increase workloads from initial exercise prescription for resistance, speed, and METs.;Short Term: Perform resistance training exercises routinely during rehab and add in resistance training at home;Long Term: Improve cardiorespiratory fitness, muscular endurance and strength as measured by increased METs and functional capacity (6MWT)       Able to understand and use rate of perceived exertion (RPE) scale Yes       Intervention Provide education and explanation on how to use RPE scale       Expected Outcomes Short Term: Able to use RPE daily in rehab to express subjective intensity level;Long Term:  Able to use RPE to guide intensity level when exercising independently       Knowledge and understanding of Target Heart Rate Range (THRR) Yes       Intervention Provide education and explanation of THRR including how the numbers were predicted and where they are located for reference       Expected Outcomes Short Term: Able to state/look up THRR;Short Term: Able to use daily as guideline for intensity in rehab;Long Term: Able to use THRR to govern intensity when exercising independently       Understanding of Exercise Prescription Yes       Intervention Provide education, explanation, and written materials on patient's individual exercise prescription       Expected Outcomes Short Term: Able to explain program exercise prescription;Long Term: Able to explain home exercise prescription to exercise independently                Exercise Goals Re-Evaluation:  Exercise Goals Re-Evaluation     Row Name 10/02/22 1431 10/28/22 1432 11/20/22 1558         Exercise Goal Re-Evaluation   Exercise Goals Review Increase Physical Activity;Knowledge  and understanding of Target Heart Rate Range (THRR);Increase Strength and Stamina;Able to understand and use rate of perceived exertion (RPE) scale;Understanding of Exercise Prescription Increase Physical Activity;Knowledge and understanding of Target Heart Rate Range (THRR);Increase Strength and Stamina;Able to understand and use rate of perceived exertion (RPE) scale;Understanding of Exercise Prescription Increase Physical Activity;Knowledge and understanding of Target Heart Rate Range (THRR);Increase Strength and Stamina;Able to understand and use rate of perceived exertion (RPE) scale;Understanding of Exercise Prescription     Comments Pt's first day in the [facility] program. Pt understands the RPE scale, Exercise Rx and THRR. Reviewed METs and goals. Pt is progressing well, peak METs today 4.4. 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 When the patient started the [facility] program, she was having trouble with this task at work. She also voices increased grip strength which was a goal. Pt graduated from the [facility] program today. Pt voices of reaching her goals of increasing strength and stamina, improved upper body and grip strength. 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 She has not been yet to  try but feels confident that she can pursue this hobby again.     Expected Outcomes Will continue to montior and progress exercise workloads as tolerated. Will continue to montior and progress exercise workloads as tolerated. Will continue to montior and progress exercise workloads as tolerated.              Nutrition & Weight - Outcomes:  Pre Biometrics - 09/22/22 0815       Pre Biometrics   Waist Circumference 32 inches    Hip Circumference 39.5 inches    Waist to Hip Ratio 0.81 %    Triceps Skinfold 10 mm    % Body Fat 28.1 %    Grip Strength 26 kg    Flexibility 13.5 in    Single Leg Stand 30 seconds             Post Biometrics -  11/18/22 1429        Post  Biometrics   Height 5' 8.75" (1.746 m)    Weight 65.9 kg    Waist Circumference 31.75 inches    Hip Circumference 40 inches    Waist to Hip Ratio 0.79 %    BMI (Calculated) 21.62    Triceps Skinfold 10 mm    % Body Fat 27.9 %    Grip Strength 30 kg    Flexibility 15 in    Single Leg Stand 30 seconds             Nutrition:  Nutrition Therapy & Goals - 11/09/22 1552       Nutrition Therapy   Diet Heart Healthy Diet      Personal Nutrition Goals   Nutrition Goal Patient to identify strategies for managing cardiovacular risk by attending the weekly [facility] education and nutrition series    Personal Goal #2 Patient to improve diet quality by using the plate method as a guide for meal planning to include lean protein/plant protein, fruits, vegetables, whole grains, and nonfat dairy as part of a balanced diet.    Comments Goals in action. [name] continues to attend the [facility] education and nutrition series. She was started on Repatha due to elevated LFTS on atorvastatin and zetia. She reports drinking up to two Boost Plus daily (360kcals, 14g protein each); her typical adult weight is around 140-145# which she has maintained. Her daughter follows a plant based diet and remains a good support for her.  Patient will benefit from participation in intensive cardiac rehab for exercise, nutrition, and lifestyle modification.      Intervention Plan   Intervention Prescribe, educate and counsel regarding individualized specific dietary modifications aiming towards targeted core components such as weight, hypertension, lipid management, diabetes, heart failure and other comorbidities.;Nutrition handout(s) given to patient.    Expected Outcomes Short Term Goal: Understand basic principles of dietary content, such as calories, fat, sodium, cholesterol and nutrients.;Long Term Goal: Adherence to prescribed nutrition plan.             Nutrition Discharge:   Nutrition Assessments - 11/25/22 1101       Rate Your Plate Scores   Post Score 70             Education Questionnaire Score:  Knowledge Questionnaire Score - 11/24/22 1551       Knowledge Questionnaire Score   Post Score 21/24             Goals reviewed with patient; copy given to patient. Pt graduates from  Intensive cardiac rehab program  on 11/20/22  completion of  20 exercise and  20 education sessions. Pt maintained good attendance and progressed nicely during their participation in rehab as evidenced by increased MET level. [name] increased her distance on her post exercise walk test by  123 feet. Medication list reconciled. Repeat  PHQ score- 0 .  Pt has made significant lifestyle changes and should be commended for her success. [name] achieved their goals during cardiac rehab.   Pt plans to continue exercise at the [facility] and walking on her treadmill. [name] continues to abstain from smoking. [name] lost 2.7 kg while in the program We are proud of [name]'s progress! [name] said she was skeptical about participating in the program initially. [name] says the program was beneficial to her she is glad she participated. [name] says she feels stronger and has more energy.Harrell Gave RN BSN

## 2022-11-24 ENCOUNTER — Ambulatory Visit: Payer: BC Managed Care – PPO | Attending: Interventional Cardiology

## 2022-11-24 DIAGNOSIS — R7989 Other specified abnormal findings of blood chemistry: Secondary | ICD-10-CM

## 2022-11-24 DIAGNOSIS — E785 Hyperlipidemia, unspecified: Secondary | ICD-10-CM

## 2022-11-25 LAB — HEPATIC FUNCTION PANEL
ALT: 14 IU/L (ref 0–32)
AST: 14 IU/L (ref 0–40)
Albumin: 4.1 g/dL (ref 3.8–4.9)
Alkaline Phosphatase: 81 IU/L (ref 44–121)
Bilirubin Total: 0.3 mg/dL (ref 0.0–1.2)
Bilirubin, Direct: <0.1 mg/dL (ref 0.00–0.40)
Total Protein: 7.1 g/dL (ref 6.0–8.5)

## 2022-11-25 LAB — LIPID PANEL
Chol/HDL Ratio: 2.3 ratio (ref 0.0–4.4)
Cholesterol, Total: 146 mg/dL (ref 100–199)
HDL: 64 mg/dL (ref >39)
LDL Chol Calc (NIH): 67 mg/dL (ref 0–99)
Triglycerides: 76 mg/dL (ref 0–149)
VLDL Cholesterol Cal: 15 mg/dL (ref 5–40)

## 2022-12-01 ENCOUNTER — Telehealth: Payer: Self-pay | Admitting: Pharmacist

## 2022-12-01 DIAGNOSIS — I2111 ST elevation (STEMI) myocardial infarction involving right coronary artery: Secondary | ICD-10-CM

## 2022-12-01 DIAGNOSIS — E785 Hyperlipidemia, unspecified: Secondary | ICD-10-CM

## 2022-12-01 MED ORDER — REPATHA SURECLICK 140 MG/ML ~~LOC~~ SOAJ: 1.0000 mL | SUBCUTANEOUS | 3 refills | Status: DC

## 2022-12-01 MED ORDER — NEXLETOL 180 MG PO TABS: 1.0000 | ORAL_TABLET | Freq: Every day | ORAL | 3 refills | Status: DC

## 2022-12-01 NOTE — Telephone Encounter (Signed)
Spoke with pt about her lipid results. LFT normal. LDL-C 67. Goal <55 due to premature age. Feels great on Repatha. Cannot use statins due to elevated LFT. Discussed adding Nexletol. Pt agreeable. When I attempted PA, it says PA has been resolved. Rx sent to pharmacy Pt made aware. Scheduled for labs 6/18

## 2022-12-10 NOTE — Addendum Note (Signed)
Encounter addended by: Magda Kiel, RN on: 12/10/2022 3:03 PM  Actions taken: Clinical Note Signed, Episode resolved

## 2023-02-23 ENCOUNTER — Ambulatory Visit: Payer: BC Managed Care – PPO | Attending: Interventional Cardiology

## 2023-02-23 DIAGNOSIS — E785 Hyperlipidemia, unspecified: Secondary | ICD-10-CM

## 2023-02-23 DIAGNOSIS — I2111 ST elevation (STEMI) myocardial infarction involving right coronary artery: Secondary | ICD-10-CM

## 2023-02-24 LAB — LIPID PANEL
Chol/HDL Ratio: 2.3 ratio (ref 0.0–4.4)
Cholesterol, Total: 110 mg/dL (ref 100–199)
HDL: 48 mg/dL (ref >39)
LDL Chol Calc (NIH): 45 mg/dL (ref 0–99)
Triglycerides: 88 mg/dL (ref 0–149)
VLDL Cholesterol Cal: 17 mg/dL (ref 5–40)

## 2023-02-24 LAB — HEPATIC FUNCTION PANEL
ALT: 8 IU/L (ref 0–32)
AST: 15 IU/L (ref 0–40)
Albumin: 4.1 g/dL (ref 3.8–4.9)
Alkaline Phosphatase: 57 IU/L (ref 44–121)
Bilirubin Total: 0.3 mg/dL (ref 0.0–1.2)
Bilirubin, Direct: <0.1 mg/dL (ref 0.00–0.40)
Total Protein: 7.1 g/dL (ref 6.0–8.5)

## 2023-02-24 LAB — APOLIPOPROTEIN B: Apolipoprotein B: 40 mg/dL (ref <90)

## 2023-02-26 ENCOUNTER — Encounter (INDEPENDENT_AMBULATORY_CARE_PROVIDER_SITE_OTHER): Payer: BC Managed Care – PPO | Admitting: Family Medicine

## 2023-02-26 DIAGNOSIS — R040 Epistaxis: Secondary | ICD-10-CM | POA: Diagnosis not present

## 2023-03-01 ENCOUNTER — Other Ambulatory Visit: Payer: Self-pay

## 2023-03-01 MED ORDER — NITROGLYCERIN 0.4 MG SL SUBL: 0.4000 mg | SUBLINGUAL_TABLET | SUBLINGUAL | 8 refills | Status: DC | PRN

## 2023-03-06 NOTE — Telephone Encounter (Signed)
Please see the MyChart message reply(ies) for my assessment and plan.  The patient gave consent for this Medical Advice Message and is aware that it may result in a bill to their insurance company as well as the possibility that this may result in a co-payment or deductible. They are an established patient, but are not seeking medical advice exclusively about a problem treated during an in person or video visit in the last 7 days. I did not recommend an in person or video visit within 7 days of my reply.  I spent a total of 15 minutes cumulative time within 7 days through MyChart messaging Lajuan Godbee, MD  

## 2023-06-03 ENCOUNTER — Other Ambulatory Visit: Payer: Self-pay

## 2023-06-03 MED ORDER — TICAGRELOR 90 MG PO TABS: 90.0000 mg | ORAL_TABLET | Freq: Two times a day (BID) | ORAL | 5 refills | Status: DC

## 2023-06-30 ENCOUNTER — Telehealth: Payer: Self-pay | Admitting: Pharmacist

## 2023-06-30 NOTE — Telephone Encounter (Signed)
*  STAT* If patient is at the pharmacy, call can be transferred to refill team.   1. Which medications need to be refilled? (please list name of each medication and dose if known)   ticagrelor (BRILINTA) 90 MG TABS tablet    2. Which pharmacy/location (including street and city if local pharmacy) is medication to be sent to? [id number] - [place], [place] - [address] Phone: [phone]  Fax: [phone]     3. Do they need a 30 day or 90 day supply? 90  Per Pharmacy needs a new script for this medication

## 2023-07-01 MED ORDER — TICAGRELOR 90 MG PO TABS: 90.0000 mg | ORAL_TABLET | Freq: Two times a day (BID) | ORAL | 2 refills | Status: DC

## 2023-09-02 ENCOUNTER — Telehealth: Payer: Self-pay | Admitting: Nurse Practitioner

## 2023-09-02 NOTE — Telephone Encounter (Signed)
Patient sent message to scheduling requesting an appt today with Robin Searing, NP for a bulge in her neck. She states that it looks like a vein. I advised her that we do not have anything open today with [facility] or with any of our providers. She advised that she is not having any symptoms, no chest pain, SOB, lightheadedness, sweating, nausea. She denies that the bulge is hot, tender or swollen. I advised I would send a message to our triage team to let them know. I am working on getting her established with a new [facility] provider.

## 2023-09-02 NOTE — Telephone Encounter (Signed)
Pt reports she noticed a bulge on her neck today about 11 or 11:30 am.  She is requesting to be seen today for evaluation.  She denies any signs/symptoms and states she feels fine.  She has not had any recent trauma/injury to the area. Advised to contact her PCP to see if she can be seen there if she feels she can not wait to be seen at her 09/06/23 appt as schedule.  Pt states understanding and will contact her PCP.

## 2023-09-06 ENCOUNTER — Ambulatory Visit: Payer: BC Managed Care – PPO | Attending: Cardiology | Admitting: Cardiology

## 2023-09-06 ENCOUNTER — Encounter: Payer: Self-pay | Admitting: Cardiology

## 2023-09-06 VITALS — BP 130/80 | HR 78 | Resp 16 | Ht 68.0 in | Wt 151.0 lb

## 2023-09-06 DIAGNOSIS — Z720 Tobacco use: Secondary | ICD-10-CM

## 2023-09-06 DIAGNOSIS — I2510 Atherosclerotic heart disease of native coronary artery without angina pectoris: Secondary | ICD-10-CM

## 2023-09-06 DIAGNOSIS — E785 Hyperlipidemia, unspecified: Secondary | ICD-10-CM | POA: Diagnosis not present

## 2023-09-06 MED ORDER — CLOPIDOGREL BISULFATE 75 MG PO TABS: 75.0000 mg | ORAL_TABLET | Freq: Every day | ORAL | 3 refills | Status: DC

## 2023-09-06 NOTE — Progress Notes (Signed)
Cardiology Office Note:  .   Date:  09/06/2023  ID:  [name], DOB 11/15/1965, MRN [id number] PCP:  Pearline Cables, MD  Former Cardiology Providers: Dr. [facility] Providers Cardiologist:  Tessa Lerner, DO , [facility] (established care 09/06/23) Electrophysiologist:  None  Click to update primary MD,subspecialty MD or APP then REFRESH:1}    Chief Complaint  Patient presents with   Follow-up    CAD    History of Present Illness: .   [name] is a 57 y.o. African-American female whose past medical history and cardiovascular risk factors includes: CAD status post STEMI treated with PCI/DES to the RCA 06/2022 (anginal equivalents teeth pain), tobacco abuse, prediabetes, history of DVT and anemia  Formally under the care of Dr. Eldridge Dace who last saw [name] back in March 2024. I am seeing her for the first time to re-establishing care.   Since last office visit she denies any anginal chest pain or heart failure symptoms.  Overall function capacity remains stable.  Does endorse easy bruising with minimal activity at times.  Last week she noted some atypical bulge feeling in the left lateral neck which is likely secondary to dilated vein overall it is improving.  Review of Systems: .   Review of Systems  Cardiovascular:  Negative for chest pain, claudication, irregular heartbeat, leg swelling, near-syncope, orthopnea, palpitations, paroxysmal nocturnal dyspnea and syncope.  Respiratory:  Negative for shortness of breath.   Hematologic/Lymphatic: Negative for bleeding problem.    Studies Reviewed:   EKG: EKG Interpretation Date/Time:  Monday September 06 2023 11:45:20 EST Ventricular Rate:  74 PR Interval:  146 QRS Duration:  64 QT Interval:  372 QTC Calculation: 412 R Axis:   63  Text Interpretation: Normal sinus rhythm Nonspecific T wave abnormality When compared with ECG of 08-Aug-2022 20:10, Nonspecific T wave abnormality has replaced inverted T  waves in Anterior leads Confirmed by Tessa Lerner ([id number]) on 09/06/2023 12:19:03 PM  Echocardiogram: October 2023: LVEF 55 to 60%. Normal diastolic function. Right ventricular size and function normal. No significant valvular heart disease. Estimated RAP 3 mmHg  Heart catheterization October 2023: CONCLUSIONS: Inferior STEMI treated with 16 x 2.75 Synergy postdilated to 3.0 mm proximally and mid vessel, converting TIMI grade 0 flow to TIMI-3 flow and improving 100% stenosis to 0%. Patent left main Moderate diffuse disease in LAD with up to 35% in the ostial to proximal segment. Mild diffuse disease in circumflex. Right coronary is dominant.  PDA collaterals are noted from both the circumflex and LAD to the PDA.  The proximal to mid segment contains 55 to 65% stenosis. Overall LV function is normal without significant regional wall motion abnormality.  LVEDP 21 mmHg suggesting acute diastolic heart failure due to ischemia.   RECOMMENDATIONS: Preventive therapy with high intensity statin therapy and the addition of an ACE or ARB in this patient with prediabetes. Dual antiplatelet therapy with aspirin and Brilinta for at least 3 months, okay to switch to aspirin and Plavix starting at 3 months.  After 12 months of dual antiplatelet therapy drop aspirin and continue clopidogrel monotherapy.   RADIOLOGY: NA  Risk Assessment/Calculations:   N/A   Labs:       Latest Ref Rng & Units 08/08/2022    8:10 PM 06/26/2022    2:04 AM 06/25/2022    2:36 AM  CBC  WBC 4.0 - 10.5 K/uL 8.4  3.5  4.0   Hemoglobin 12.0 - 15.0 g/dL 84.1  32.4  11.2  Hematocrit 36.0 - 46.0 % 37.0  32.8  33.9   Platelets 150 - 400 K/uL 435  216  219        Latest Ref Rng & Units 08/08/2022    8:10 PM 08/06/2022   12:14 PM 06/25/2022    2:36 AM  BMP  Glucose 70 - 99 mg/dL 161  096  94   BUN 6 - 20 mg/dL 7  10  7    Creatinine 0.44 - 1.00 mg/dL 0.45  4.09  8.11   BUN/Creat Ratio 9 - 23  12    Sodium 135 -  145 mmol/L 139  138  139   Potassium 3.5 - 5.1 mmol/L 3.9  4.0  3.6   Chloride 98 - 111 mmol/L 104  100  108   CO2 22 - 32 mmol/L 25  22  25    Calcium 8.9 - 10.3 mg/dL 9.3  9.7  9.1       Latest Ref Rng & Units 02/23/2023    7:41 AM 11/24/2022    8:35 AM 09/24/2022   11:09 AM  CMP  Total Protein 6.0 - 8.5 g/dL 7.1  7.1  7.4   Total Bilirubin 0.0 - 1.2 mg/dL 0.3  0.3  0.3   Alkaline Phos 44 - 121 IU/L 57  81  111   AST 0 - 40 IU/L 15  14  20    ALT 0 - 32 IU/L 8  14  17      Lab Results  Component Value Date   CHOL 110 02/23/2023   HDL 48 02/23/2023   LDLCALC 45 02/23/2023   TRIG 88 02/23/2023   CHOLHDL 2.3 02/23/2023   No results for input(s): "LIPOA" in the last 8760 hours. No components found for: "NTPROBNP" No results for input(s): "PROBNP" in the last 8760 hours. No results for input(s): "TSH" in the last 8760 hours.  Physical Exam:    Today's Vitals   09/06/23 1142  BP: 130/80  Pulse: 78  Resp: 16  SpO2: 99%  Weight: 151 lb (68.5 kg)  Height: 5\' 8"  (1.727 m)   Body mass index is 22.96 kg/m. Wt Readings from Last 3 Encounters:  09/06/23 151 lb (68.5 kg)  11/18/22 145 lb 4.5 oz (65.9 kg)  11/09/22 148 lb 9.6 oz (67.4 kg)    Physical Exam  Constitutional: No distress.  hemodynamically stable  Neck: No JVD present.  Cardiovascular: Normal rate, regular rhythm, S1 normal and S2 normal. Exam reveals no gallop, no S3 and no S4.  No murmur heard. Pulmonary/Chest: Effort normal and breath sounds normal. No stridor. She has no wheezes. She has no rales.  Abdominal: Soft. Bowel sounds are normal. She exhibits no distension. There is no abdominal tenderness.  Musculoskeletal:        General: No edema.     Cervical back: Neck supple.  Neurological: She is alert and oriented to person, place, and time. She has intact cranial nerves (2-12).  Skin: Skin is warm.     Impression & Recommendation(s):  Impression:   ICD-10-CM   1. Coronary artery disease involving  native coronary artery of native heart without angina pectoris  I25.10 EKG 12-Lead    clopidogrel (PLAVIX) 75 MG tablet    2. Hyperlipidemia with target LDL less than 55  E78.5     3. Tobacco use  Z72.0        Recommendation(s):  Coronary artery disease involving native coronary artery of native heart without angina pectoris History of inferior  STEMI. Status post coronary intervention back in March 2023. Currently on Brilinta 90 mg p.o. twice daily. Due to easy bruisability will change Brilinta to Plavix 75 mg p.o. daily.  On day 1 of Plavix patient is advised to take 75 mg tablets x 4 for a total dose of 300 mg p.o. x 1 on day 2 and thereafter Plavix 75 mg 1 tablet every day. Prior echo results reviewed. Labs from June 2024 independently reviewed-LDL at goal  Hyperlipidemia with target LDL less than 55 Currently on Nexletol and Repatha.   She denies myalgia or other side effects. Most recent lipids dated June 2024, independently reviewed as noted above.  Tobacco use Reemphasized the importance of continued complete smoking cessation.   Orders Placed:  Orders Placed This Encounter  Procedures   EKG 12-Lead    As part of medical decision making echo report October 2023, heart catheterization report October 2023, last office note 3//2024, independently reviewed labs from June 2024, EKG were reviewed during today's encounter  Final Medication List:    Meds ordered this encounter  Medications   clopidogrel (PLAVIX) 75 MG tablet    Sig: Take 1 tablet (75 mg total) by mouth daily. Take 4 tablets (total 300 mg) on day 1. Proceed with one tablet once daily after that.    Dispense:  90 tablet    Refill:  3    Medications Discontinued During This Encounter  Medication Reason   ticagrelor (BRILINTA) 90 MG TABS tablet Discontinued by provider     Current Outpatient Medications:    Bempedoic Acid (NEXLETOL) 180 MG TABS, Take 1 tablet (180 mg total) by mouth daily., Disp: 90  tablet, Rfl: 3   clopidogrel (PLAVIX) 75 MG tablet, Take 1 tablet (75 mg total) by mouth daily. Take 4 tablets (total 300 mg) on day 1. Proceed with one tablet once daily after that., Disp: 90 tablet, Rfl: 3   Evolocumab (REPATHA SURECLICK) 140 MG/ML SOAJ, Inject 140 mg into the skin every 14 (fourteen) days., Disp: 6 mL, Rfl: 3   melatonin 5 MG TABS, Take 10 mg by mouth at bedtime as needed., Disp: , Rfl:    Multiple Vitamins-Minerals (ONE-A-DAY FOR HER VITACRAVES) CHEW, Chew 1 tablet by mouth in the morning., Disp: , Rfl:    nitroGLYCERIN (NITROSTAT) 0.4 MG SL tablet, Place 1 tablet (0.4 mg total) under the tongue every 5 (five) minutes as needed., Disp: 25 tablet, Rfl: 8  Consent:   N/A  Disposition:   1 year follow-up sooner if needed  Her questions and concerns were addressed to her satisfaction. She voices understanding of the recommendations provided during this encounter.    Signed, Tessa Lerner, DO, [facility]  [facility]  [address] [place], [place] [address] 09/06/2023 12:47 PM

## 2023-09-06 NOTE — Patient Instructions (Signed)
Medication Instructions:  Your physician has recommended you make the following change in your medication:  STOP: Brilinta  START: Plavix 75 mg once daily  *Day 1 take 4 tablets (total 300 mg), then proceed with one tablet once a day going forward  *If you need a refill on your cardiac medications before your next appointment, please call your pharmacy*  Lab Work: None ordered today. If you have labs (blood work) drawn today and your tests are completely normal, you will receive your results only by: MyChart Message (if you have MyChart) OR A paper copy in the mail If you have any lab test that is abnormal or we need to change your treatment, we will call you to review the results.  Testing/Procedures: None ordered today.  Follow-Up: At [facility], you and your health needs are our priority.  As part of our continuing mission to provide you with exceptional heart care, we have created designated Provider Care Teams.  These Care Teams include your primary Cardiologist (physician) and Advanced Practice Providers (APPs -  Physician Assistants and Nurse Practitioners) who all work together to provide you with the care you need, when you need it.  We recommend signing up for the patient portal called "MyChart".  Sign up information is provided on this After Visit Summary.  MyChart is used to connect with patients for Virtual Visits (Telemedicine).  Patients are able to view lab/test results, encounter notes, upcoming appointments, etc.  Non-urgent messages can be sent to your provider as well.   To learn more about what you can do with MyChart, go to [email or web address].    Your next appointment:   1 year(s)  The format for your next appointment:   In Person  Provider:   Tessa Lerner, DO {

## 2023-11-04 ENCOUNTER — Other Ambulatory Visit: Payer: Self-pay | Admitting: Interventional Cardiology

## 2023-11-04 DIAGNOSIS — E785 Hyperlipidemia, unspecified: Secondary | ICD-10-CM

## 2023-11-04 DIAGNOSIS — I2510 Atherosclerotic heart disease of native coronary artery without angina pectoris: Secondary | ICD-10-CM

## 2023-11-16 ENCOUNTER — Other Ambulatory Visit: Payer: Self-pay

## 2023-11-16 ENCOUNTER — Emergency Department (HOSPITAL_COMMUNITY): Admission: EM | Admit: 2023-11-16 | Discharge: 2023-11-16 | Disposition: A | Discharge status: Home / Self Care

## 2023-11-16 DIAGNOSIS — I2510 Atherosclerotic heart disease of native coronary artery without angina pectoris: Secondary | ICD-10-CM | POA: Diagnosis not present

## 2023-11-16 DIAGNOSIS — R112 Nausea with vomiting, unspecified: Secondary | ICD-10-CM

## 2023-11-16 DIAGNOSIS — Z7901 Long term (current) use of anticoagulants: Secondary | ICD-10-CM | POA: Insufficient documentation

## 2023-11-16 DIAGNOSIS — U071 COVID-19: Secondary | ICD-10-CM | POA: Insufficient documentation

## 2023-11-16 LAB — CBC WITH DIFFERENTIAL/PLATELET
Abs Immature Granulocytes: 0.03 10*3/uL (ref 0.00–0.07)
Basophils Absolute: 0 10*3/uL (ref 0.0–0.1)
Basophils Relative: 0 %
Eosinophils Absolute: 0 10*3/uL (ref 0.0–0.5)
Eosinophils Relative: 1 %
HCT: 40.8 % (ref 36.0–46.0)
Hemoglobin: 13.1 g/dL (ref 12.0–15.0)
Immature Granulocytes: 1 %
Lymphocytes Relative: 8 %
Lymphs Abs: 0.5 10*3/uL — ABNORMAL LOW (ref 0.7–4.0)
MCH: 29.4 pg (ref 26.0–34.0)
MCHC: 32.1 g/dL (ref 30.0–36.0)
MCV: 91.7 fL (ref 80.0–100.0)
Monocytes Absolute: 0.3 10*3/uL (ref 0.1–1.0)
Monocytes Relative: 5 %
Neutro Abs: 5.6 10*3/uL (ref 1.7–7.7)
Neutrophils Relative %: 85 %
Platelets: 310 10*3/uL (ref 150–400)
RBC: 4.45 MIL/uL (ref 3.87–5.11)
RDW: 13.4 % (ref 11.5–15.5)
WBC: 6.4 10*3/uL (ref 4.0–10.5)
nRBC: 0 % (ref 0.0–0.2)

## 2023-11-16 LAB — COMPREHENSIVE METABOLIC PANEL
ALT: 11 U/L (ref 0–44)
AST: 21 U/L (ref 15–41)
Albumin: 4 g/dL (ref 3.5–5.0)
Alkaline Phosphatase: 39 U/L (ref 38–126)
Anion gap: 11 (ref 5–15)
BUN: 12 mg/dL (ref 6–20)
CO2: 23 mmol/L (ref 22–32)
Calcium: 9 mg/dL (ref 8.9–10.3)
Chloride: 105 mmol/L (ref 98–111)
Creatinine, Ser: 1.01 mg/dL — ABNORMAL HIGH (ref 0.44–1.00)
GFR, Estimated: >60 mL/min (ref >60)
Glucose, Bld: 120 mg/dL — ABNORMAL HIGH (ref 70–99)
Potassium: 3.6 mmol/L (ref 3.5–5.1)
Sodium: 139 mmol/L (ref 135–145)
Total Bilirubin: 0.6 mg/dL (ref 0.0–1.2)
Total Protein: 7.9 g/dL (ref 6.5–8.1)

## 2023-11-16 LAB — RESP PANEL BY RT-PCR (RSV, FLU A&B, COVID)  RVPGX2
Influenza A by PCR: NEGATIVE
Influenza B by PCR: NEGATIVE
Resp Syncytial Virus by PCR: NEGATIVE
SARS Coronavirus 2 by RT PCR: POSITIVE — AB

## 2023-11-16 LAB — MAGNESIUM: Magnesium: 2.2 mg/dL (ref 1.7–2.4)

## 2023-11-16 LAB — LIPASE, BLOOD: Lipase: 30 U/L (ref 11–51)

## 2023-11-16 MED ORDER — SODIUM CHLORIDE 0.9 % IV BOLUS
1000.0000 mL | Freq: Once | INTRAVENOUS | Status: AC
  Administered 2023-11-16: 1000 mL via INTRAVENOUS

## 2023-11-16 MED ORDER — TRIMETHOBENZAMIDE HCL 300 MG PO CAPS
300.0000 mg | ORAL_CAPSULE | Freq: Three times a day (TID) | ORAL | 0 refills | Status: DC | PRN
Start: 2023-11-16 — End: 2024-06-01

## 2023-11-16 NOTE — ED Provider Notes (Signed)
 [place] EMERGENCY DEPARTMENT AT [facility] Provider Note   CSN: [id number] Arrival date & time: 11/16/23  0355     History  Chief Complaint  Patient presents with   Nausea    [name] is a 58 y.o. female.  The history is provided by the patient and medical records.   58 year old female with history of anemia, coronary artery disease, presenting to the ED with a few days of nausea, vomiting, and diarrhea.  She attended a very large funeral on Saturday of approximately 800 people after the passing of their church [employer].  Since that time she has been feeling poorly.  She has been trying multiple over-the-counter medications including Alka-Seltzer, Pepto, etc. without much relief.  She has been able to eat Jell-O, broth, and other gentle foods.  States she just continues feeling like she is going to pass out whenever she gets up.  She denies any bloody stools.  No bloody emesis.  She denies any fever or chills.  No known sick contacts.  She did go out to dinner with husband after the funeral, they ate the same foods and he is otherwise feeling fine.  Home Medications Prior to Admission medications   Medication Sig Start Date End Date Taking? Authorizing Provider  trimethobenzamide (TIGAN) 300 MG capsule Take 1 capsule (300 mg total) by mouth 3 (three) times daily as needed for nausea/vomiting. 11/16/23  Yes Garlon Hatchet, PA-C  Bempedoic Acid (NEXLETOL) 180 MG TABS Take 1 tablet (180 mg total) by mouth daily. 12/01/22   Corky Crafts, MD  clopidogrel (PLAVIX) 75 MG tablet Take 1 tablet (75 mg total) by mouth daily. Take 4 tablets (total 300 mg) on day 1. Proceed with one tablet once daily after that. 09/06/23   Tolia, Sunit, DO  Evolocumab (REPATHA SURECLICK) 140 MG/ML SOAJ INJECT 140 MG INTO THE SKIN EVERY 14 (FOURTEEN) DAYS. 11/04/23   Tolia, Sunit, DO  melatonin 5 MG TABS Take 10 mg by mouth at bedtime as needed.    [provider]  Multiple  Vitamins-Minerals (ONE-A-DAY FOR HER VITACRAVES) CHEW Chew 1 tablet by mouth in the morning.    [provider]  nitroGLYCERIN (NITROSTAT) 0.4 MG SL tablet Place 1 tablet (0.4 mg total) under the tongue every 5 (five) minutes as needed. 03/01/23   Corky Crafts, MD      Allergies    Lipitor [atorvastatin]    Review of Systems   Review of Systems  Gastrointestinal:  Positive for diarrhea, nausea and vomiting.  All other systems reviewed and are negative.   Physical Exam Updated Vital Signs BP 103/69   Pulse 91   Temp 97.9 F (36.6 C) (Oral)   Resp 16   Ht 5\' 6"  (1.676 m)   Wt 67.1 kg   LMP 03/01/2016   SpO2 100%   BMI 23.89 kg/m   Physical Exam Vitals and nursing note reviewed.  Constitutional:      Appearance: She is well-developed.  HENT:     Head: Normocephalic and atraumatic.  Eyes:     Conjunctiva/sclera: Conjunctivae normal.     Pupils: Pupils are equal, round, and reactive to light.  Cardiovascular:     Rate and Rhythm: Normal rate and regular rhythm.     Heart sounds: Normal heart sounds.  Pulmonary:     Effort: Pulmonary effort is normal. No respiratory distress.     Breath sounds: Normal breath sounds. No rhonchi.  Abdominal:     General: Bowel  sounds are normal.     Palpations: Abdomen is soft.     Tenderness: There is no abdominal tenderness. There is no guarding or rebound.  Musculoskeletal:        General: Normal range of motion.     Cervical back: Normal range of motion.  Skin:    General: Skin is warm and dry.  Neurological:     Mental Status: She is alert and oriented to person, place, and time.     ED Results / Procedures / Treatments   Labs (all labs ordered are listed, but only abnormal results are displayed) Labs Reviewed  RESP PANEL BY RT-PCR (RSV, FLU A&B, COVID)  [id number] - Abnormal; Notable for the following components:      Result Value   SARS Coronavirus 2 by RT PCR POSITIVE (*)    All other components within normal  limits  CBC WITH DIFFERENTIAL/PLATELET - Abnormal; Notable for the following components:   Lymphs Abs 0.5 (*)    All other components within normal limits  COMPREHENSIVE METABOLIC PANEL - Abnormal; Notable for the following components:   Glucose, Bld 120 (*)    Creatinine, Ser 1.01 (*)    All other components within normal limits  LIPASE, BLOOD  MAGNESIUM    EKG EKG Interpretation Date/Time:  Tuesday November 16 2023 04:04:12 EDT Ventricular Rate:  90 PR Interval:  151 QRS Duration:  53 QT Interval:  468 QTC Calculation: 573 R Axis:   45  Text Interpretation: Sinus rhythm Abnormal T, consider ischemia, anterior leads Prolonged QT interval Confirmed by Beckey Downing [phone]) on 11/16/2023 5:00:16 AM  Radiology No results found.  Procedures Procedures    Medications Ordered in ED Medications  sodium chloride 0.9 % bolus 1,000 mL (0 mLs Intravenous Stopped 11/16/23 0527)    ED Course/ Medical Decision Making/ A&P                                 Medical Decision Making Amount and/or Complexity of Data Reviewed Labs: ordered. ECG/medicine tests: ordered and independent interpretation performed.  Risk Prescription drug management.   58 year old female presenting to the ED with nausea, vomiting, and diarrhea for the past 24 to 48 hours.  States she just feels very weak and like she may pass out with standing.  She denies any chest pain or abdominal pain.  She denies any sick contacts, however did attend an 800 person funeral over the weekend.  She is hemodynamically stable.  Will check labs, RVP.  She is given IV fluid bolus.  Labs are grossly reassuring without leukocytosis or electrolyte derangement.  Normal lipase.  RVP is positive for COVID-19.  Suspect this is likely etiology of her symptoms.  Exposure likely at funeral.  She is feeling better with IV fluids, currently tolerating PO.  Stable for discharge.  Rx for Tigan given known QT prolongation.  Encouraged to rest and  hydrate well, follow-up with PCP.  Return here for new concerns.  Final Clinical Impression(s) / ED Diagnoses Final diagnoses:  COVID-19  Nausea vomiting and diarrhea    Rx / DC Orders ED Discharge Orders          Ordered    trimethobenzamide (TIGAN) 300 MG capsule  3 times daily PRN        11/16/23 0542              Garlon Hatchet, PA-C 11/16/23 [phone]  Durwin Glaze, MD 11/16/23 [phone]

## 2023-11-16 NOTE — ED Notes (Signed)
Pt ambulated to the bathroom with no issues.

## 2023-11-16 NOTE — Discharge Instructions (Signed)
Take the prescribed medication as directed.  Make sure to rest and hydrate well at home. Follow-up with your primary care doctor. Return to the ED for new or worsening symptoms.

## 2023-11-16 NOTE — ED Notes (Signed)
Pt able to tolerate po fluids with no issues.

## 2023-11-16 NOTE — ED Triage Notes (Signed)
 Pt bibgcems from home c/o N/V/Dx1 day along with weakness. Dizziness with standing, denies chest pain and abd pain.  Bp - 117/72 Hr 92 96% RA  4mg  zofran  250 Ml fluid with EMS

## 2023-12-06 ENCOUNTER — Other Ambulatory Visit: Payer: Self-pay | Admitting: Interventional Cardiology

## 2024-01-04 NOTE — Progress Notes (Signed)
 [facility] [address] [place], [place] [address] [phone]  Date:  01/06/2024   Name:  [name]   DOB:  August 20, 1966   MRN:  [id number]  PCP:  Kaylee Partridge, MD    Chief Complaint: Nasal Congestion (Pt states she had COVID "about 2 months ago" and can not seem to shake the congestion. Yellow mucus /Pt states the symptoms have circled around about "2 times"/Cough worse at night, pt states both arm pits feel numb after COVID )   History of Present Illness:  [name] is a 58 y.o. very pleasant female patient who presents with the following:  Patient seen today with concern of illness/congestion Most recent visit with myself 12/23  She was seen in the ER on March 11 with COVID-19- this was her first bout with covid She also had a STEMI in October 2023, treated with PCI Following her STEMI she had some transaminitis possibly due to statins I checked her LFTs in December, asked her to come back for recheck labs in about 10 days-she did not end up doing this, but recent LFTs on chart from March of this year normal  She started to get better about a week ago from the covid- it took her 2 weeks to get better She continues to note cough and chest congestion No fever noted No further syncope She is able to eat ok  She is bringing up phlegm from her chest She is is not sneezing or having runny nose  She is not on allergy meds  No history of allergy or asthma   Also, she notes her bilateral underarms have felt itchy and tingly for about 2 months.  She is not sure what could be causing this.  She has tried changing up her deodorant and other body care products and it has not helped.  She does not notice any pain or masses.  Her mammogram is a bit overdue-she is not quite sure when her most recent mammogram was done, she thinks in the last 2 or 3 years at an outside facility  She followed up with cardiology in  December: Coronary artery disease involving native coronary artery of native heart without angina pectoris History of inferior STEMI. Status post coronary intervention back in March 2023. Currently on Brilinta  90 mg p.o. twice daily. Due to easy bruisability will change Brilinta  to Plavix  75 mg p.o. daily.  On day 1 of Plavix  patient is advised to take 75 mg tablets x 4 for a total dose of 300 mg p.o. x 1 on day 2 and thereafter Plavix  75 mg 1 tablet every day. Prior echo results reviewed. Labs from June 2024 independently reviewed-LDL at goal Hyperlipidemia with target LDL less than 55 Currently on Nexletol  and Repatha .   She denies myalgia or other side effects. Most recent lipids dated June 2024, independently reviewed as noted above.   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   Anemia    Chicken pox    Coronary artery disease involving native coronary artery of native heart with unstable  angina pectoris (HCC) 06/24/2022   Inf STEMI =>Cardiac Cath-PCI 06/24/2022: RIGHT DOMINANT.  CULPRIT LESION-distal RCA 100% (left to right collaterals) => DES PCI (Synergy XD DES 2.75 x 16-3.0 mm postdilation), reduced to 0%.  60% proximal RCA.  Diffuse 35% proximal LAD.  Normal LCx.   Left leg DVT [facility]) 2014   Uterine leiomyoma     Past Surgical History:  Procedure Laterality Date   BUNIONECTOMY Bilateral 09/07/2005   CARDIAC CATHETERIZATION     CESAREAN SECTION     x 2   CORONARY/GRAFT ACUTE MI REVASCULARIZATION N/A 06/24/2022   Procedure: Coronary/Graft Acute MI Revascularization;  Surgeon: Arty Binning, MD;  Location: [facility] INVASIVE CV LAB;  Service: Cardiovascular;  Laterality: N/A;   ROBOTIC ASSISTED TOTAL HYSTERECTOMY WITH BILATERAL SALPINGO OOPHERECTOMY N/A 03/03/2016   Procedure: XI ROBOTIC ASSISTED TOTAL HYSTERECTOMY WITH BILATERAL Salipingectomy ;  Surgeon: Alphonso Aschoff, MD;  Location: [facility] ORS;  Service: Gynecology;  Laterality: N/A;   TUBAL LIGATION      Social History   Tobacco Use   Smoking status: Former    Current packs/day: 0.00    Average packs/day: 0.3 packs/day for 15.0 years (3.8 ttl pk-yrs)    Types: Cigarettes    Start date: 2007    Quit date: 2022    Years since quitting: 3.3   Smokeless tobacco: Never  Substance Use Topics   Alcohol use: No   Drug use: No    Family History  Problem Relation Age of Onset   Diabetes Mother    Depression Mother    Stroke Mother    Diabetes Maternal Grandmother    Heart disease Maternal Grandfather    Tuberculosis Paternal Grandfather     Allergies  Allergen Reactions   Lipitor [Atorvastatin ] Other (See Comments)    Elevated liver functions    Medication list has been reviewed and updated.  Current Outpatient Medications on File Prior to Visit  Medication Sig Dispense Refill   Bempedoic Acid  (NEXLETOL ) 180 MG TABS TAKE 1 TABLET BY MOUTH EVERY DAY 90 tablet 2   Evolocumab  (REPATHA  SURECLICK) 140 MG/ML SOAJ  INJECT 140 MG INTO THE SKIN EVERY 14 (FOURTEEN) DAYS. 6 mL 1   melatonin 5 MG TABS Take 10 mg by mouth at bedtime as needed.     Multiple Vitamins-Minerals (ONE-A-DAY FOR HER VITACRAVES) CHEW Chew 1 tablet by mouth in the morning.     nitroGLYCERIN  (NITROSTAT ) 0.4 MG SL tablet Place 1 tablet (0.4 mg total) under the tongue every 5 (five) minutes as needed. 25 tablet 8   trimethobenzamide  (TIGAN ) 300 MG capsule Take 1 capsule (300 mg total) by mouth 3 (three) times daily as needed for nausea/vomiting. 15 capsule 0   clopidogrel  (PLAVIX ) 75 MG tablet Take 1 tablet (75 mg total) by mouth daily. Take 4 tablets (total 300 mg) on day 1. Proceed with one tablet once daily after that. (Patient not taking:  Reported on 01/06/2024) 90 tablet 3   No current facility-administered medications on file prior to visit.    Review of Systems:  As per HPI- otherwise negative.   Physical Examination: Vitals:   01/06/24 1028  BP: 122/82  Pulse: 74  Temp: 98 F (36.7 C)  SpO2: 98%   Vitals:   01/06/24 1028  Weight: 156 lb 3.2 oz (70.9 kg)  Height: 5\' 6"  (1.676 m)   Body mass index is 25.21 kg/m. Ideal Body Weight: Weight in (lb) to have BMI = 25: 154.6  GEN: no acute distress.  Normal weight, looks well  HEENT: Atraumatic, Normocephalic.  Bilateral TM wnl, oropharynx normal.  PEERL,EOMI.   Ears and Nose: No external deformity. CV: RRR, No M/G/R. No JVD. No thrill. No extra heart sounds. PULM: CTA B, no wheezes, crackles, rhonchi. No retractions. No resp. distress. No accessory muscle use. ABD: S, NT, ND, +BS. No rebound. No HSM. EXTR: No c/c/e PSYCH: Normally interactive. Conversant.  Bilateral axillae are examined, I am not able to appreciate any abnormality   Assessment and Plan: Encounter for screening mammogram for malignant neoplasm of breast - Plan: MM Digital Diagnostic Bilat, US  BREAST COMPLETE UNI LEFT INC AXILLA, US  BREAST COMPLETE UNI RIGHT INC AXILLA  Persistent cough - Plan:  predniSONE  (DELTASONE ) 20 MG tablet  Patient seen today with persistent cough after a bout with COVID-19.  Suspect she has reactive airway symptoms.  Will treat her with a short course of prednisone , she will let me know if not getting better  Ordered a diagnostic mammogram and ultrasound of bilateral axillae to be done at the breast center Signed Gates Kasal, MD

## 2024-01-06 ENCOUNTER — Ambulatory Visit: Admitting: Family Medicine

## 2024-01-06 ENCOUNTER — Encounter: Payer: Self-pay | Admitting: Family Medicine

## 2024-01-06 VITALS — BP 122/82 | HR 74 | Temp 98.0°F | Ht 66.0 in | Wt 156.2 lb

## 2024-01-06 DIAGNOSIS — Z1231 Encounter for screening mammogram for malignant neoplasm of breast: Secondary | ICD-10-CM | POA: Diagnosis not present

## 2024-01-06 DIAGNOSIS — R053 Chronic cough: Secondary | ICD-10-CM | POA: Diagnosis not present

## 2024-01-06 MED ORDER — PREDNISONE 20 MG PO TABS: ORAL_TABLET | ORAL | 0 refills | Status: DC

## 2024-01-06 NOTE — Patient Instructions (Addendum)
 Good to see you today- try the prednisone  daily for 3-5 days.  Please let me know if this does not clear up your cough- we can do an x-ray and antibiotics next if needed   I ordered a mammogram and ultrasound of your bilateral armpits (axillae) to be done at the [facility] in [facility]-  Address: [address], [place], [place] [address] Phone: [phone]

## 2024-01-26 ENCOUNTER — Other Ambulatory Visit: Payer: Self-pay | Admitting: Family Medicine

## 2024-01-26 DIAGNOSIS — R053 Chronic cough: Secondary | ICD-10-CM

## 2024-01-26 DIAGNOSIS — Z1231 Encounter for screening mammogram for malignant neoplasm of breast: Secondary | ICD-10-CM

## 2024-01-26 DIAGNOSIS — R200 Anesthesia of skin: Secondary | ICD-10-CM

## 2024-01-27 ENCOUNTER — Ambulatory Visit
Admission: RE | Admit: 2024-01-27 | Discharge: 2024-01-27 | Disposition: A | Discharge status: Home / Self Care | Source: Ambulatory Visit | Attending: Family Medicine | Admitting: Family Medicine

## 2024-01-27 DIAGNOSIS — R200 Anesthesia of skin: Secondary | ICD-10-CM

## 2024-01-27 DIAGNOSIS — Z1231 Encounter for screening mammogram for malignant neoplasm of breast: Secondary | ICD-10-CM

## 2024-02-09 ENCOUNTER — Encounter

## 2024-02-09 ENCOUNTER — Other Ambulatory Visit

## 2024-04-18 ENCOUNTER — Other Ambulatory Visit: Payer: Self-pay | Admitting: Cardiology

## 2024-04-18 DIAGNOSIS — E785 Hyperlipidemia, unspecified: Secondary | ICD-10-CM

## 2024-04-18 DIAGNOSIS — I2510 Atherosclerotic heart disease of native coronary artery without angina pectoris: Secondary | ICD-10-CM

## 2024-04-26 ENCOUNTER — Other Ambulatory Visit: Payer: Self-pay | Admitting: Interventional Cardiology

## 2024-05-29 ENCOUNTER — Ambulatory Visit: Payer: Self-pay

## 2024-05-29 NOTE — Telephone Encounter (Signed)
 Appt scheduled

## 2024-05-29 NOTE — Telephone Encounter (Signed)
 FYI Only or Action Required?: Action required by provider: request for appointment.  Patient was last seen in primary care on 01/06/2024 by [name], Cynthia BROCKS, MD.  Called Nurse Triage reporting Foot Swelling.  Symptoms began a week ago.  Interventions attempted: Rest, hydration, or home remedies.  Symptoms are: unchanged.Left foot ankle swelling, saw orthopedic doctor as well. I want to be sure I'm not retaining fluid. Warm transfer to [facility] in the practice for appointment.  Triage Disposition: See PCP When Office is Open (Within 3 Days)  Patient/caregiver understands and will follow disposition?: Yes    Copied from CRM [id number]. Topic: Clinical - Red Word Triage >> May 29, 2024 12:21 PM Corin V wrote: [facility] that prompted transfer to Nurse Triage: Patient is having swelling in her left foot, ankle, and toes. She was seen by an orthopedist last week for limping and that's subsided for the most part.   Chart does not show Dr. Watt as PCP but patient states she has not requested a transfer Reason for Disposition  [1] MILD swelling of both ankles (i.e., pedal edema) AND [2] new-onset or getting worse  Answer Assessment - Initial Assessment Questions 1. ONSET: When did the swelling start? (e.g., minutes, hours, days)     left 2. LOCATION: What part of the leg is swollen?  Are both legs swollen or just one leg?     left 3. SEVERITY: How bad is the swelling? (e.g., localized; mild, moderate, severe)     mild 4. REDNESS: Is there redness or signs of infection?     no 5. PAIN: Is the swelling painful to touch? If Yes, ask: How painful is it?   (Scale 1-10; mild, moderate or severe)     4 6. FEVER: Do you have a fever? If Yes, ask: What is it, how was it measured, and when did it start?      no 7. CAUSE: What do you think is causing the leg swelling?     unsure 8. MEDICAL HISTORY: Do you have a history of blood clots (e.g., DVT), cancer, heart failure,  kidney disease, or liver failure?     no 9. RECURRENT SYMPTOM: Have you had leg swelling before? If Yes, ask: When was the last time? What happened that time?     no 10. OTHER SYMPTOMS: Do you have any other symptoms? (e.g., chest pain, difficulty breathing)       no 11. PREGNANCY: Is there any chance you are pregnant? When was your last menstrual period?       no  Protocols used: Leg Swelling and Edema-A-AH

## 2024-06-01 ENCOUNTER — Ambulatory Visit: Admitting: Family Medicine

## 2024-06-01 ENCOUNTER — Encounter: Payer: Self-pay | Admitting: Family Medicine

## 2024-06-01 VITALS — BP 95/60 | HR 80 | Ht 66.0 in | Wt 160.0 lb

## 2024-06-01 DIAGNOSIS — M25472 Effusion, left ankle: Secondary | ICD-10-CM | POA: Diagnosis not present

## 2024-06-01 NOTE — Progress Notes (Signed)
 Acute Office Visit  Subjective:     Patient ID: [name], female    DOB: Dec 09, 1965, 58 y.o.   MRN: [id number]  Chief Complaint  Patient presents with   Foot Swelling    HPI Patient is in today for left ankle swelling.   Discussed the use of AI scribe software for clinical note transcription with the patient, who gave verbal consent to proceed.  History of Present Illness [name] is a 58 year old female with a history of CAD who presents with left foot swelling.  She has been experiencing left foot swelling for about a month, initially accompanied by significant pain causing her to limp. She saw ortho with an xray just noting some inflammation. An ankle brace alleviated the limping but did not resolve the swelling. The swelling is localized to the inside of the left foot/ankle, which becomes tight and sometimes quite large and tender. There is no swelling in the right foot or other areas such as the calf. She has not had any previous injuries to the left foot.  She has a history of a STEMI in October 2023, which occurred two months after her mother's unexpected death from cardiac arrest.   She underwent a bunionectomy on both toes in 2010. 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 She mentions wearing sandals and high tops, which can be uncomfortable when the foot is swollen. She has tried Tylenol  for pain relief, but it does not address the inflammation. She does get some relief with ice and rest.        ROS All review of systems negative except what is listed in the HPI      Objective:    BP 95/60   Pulse 80   Ht 5' 6 (1.676 m)   Wt 160 lb (72.6 kg)   LMP 03/01/2016   SpO2 100%   BMI 25.82 kg/m    Physical Exam Vitals reviewed.  Constitutional:      Appearance: Normal appearance.  Cardiovascular:     Rate and Rhythm: Normal rate and regular rhythm.     Heart sounds: Normal heart sounds.   Pulmonary:     Effort: Pulmonary effort is normal.     Breath sounds: Normal breath sounds.  Musculoskeletal:     Comments: Left medial ankle swelling, no skin changes   Skin:    General: Skin is warm and dry.  Neurological:     Mental Status: She is alert and oriented to person, place, and time.  Psychiatric:        Mood and Affect: Mood normal.        Behavior: Behavior normal.        Thought Content: Thought content normal.        Judgment: Judgment normal.        No results found for any visits on 06/01/24.      Assessment & Plan:   Problem List Items Addressed This Visit   None Visit Diagnoses       Left ankle swelling    -  Primary   Relevant Orders   Comprehensive metabolic panel with GFR   Pro b natriuretic peptide (BNP)      Assessment & Plan  Left ankle swelling and previous tenderness for a month. X-ray indicated inflammation, likely a ligament strain. Unilateral presentation suggests localized issue rather than systemic. Anti-inflammatory medications avoided due anticoagulation (CAD). Tylenol  used for pain management. -  She is nervous due to her heart history - will get CMP and BNP for peace of mind, no signs of fluid overload otherwise - Advise wearing an ankle brace when on feet for extended periods. - Recommend rest, ice, compression, and elevation (RICE) for the ankle. - Follow up with orthopedics if swelling persists for potential different brace or physical therapy.    No orders of the defined types were placed in this encounter.   Return if symptoms worsen or fail to improve.  [name] Mon, NP

## 2024-06-02 ENCOUNTER — Ambulatory Visit: Payer: Self-pay | Admitting: Family Medicine

## 2024-06-02 LAB — COMPREHENSIVE METABOLIC PANEL WITH GFR
ALT: 8 U/L (ref 0–35)
AST: 15 U/L (ref 0–37)
Albumin: 4.8 g/dL (ref 3.5–5.2)
Alkaline Phosphatase: 52 U/L (ref 39–117)
BUN: 17 mg/dL (ref 6–23)
CO2: 31 meq/L (ref 19–32)
Calcium: 10.1 mg/dL (ref 8.4–10.5)
Chloride: 100 meq/L (ref 96–112)
Creatinine, Ser: 1.03 mg/dL (ref 0.40–1.20)
GFR: 60.2 mL/min (ref >60.00)
Glucose, Bld: 86 mg/dL (ref 70–99)
Potassium: 4.1 meq/L (ref 3.5–5.1)
Sodium: 138 meq/L (ref 135–145)
Total Bilirubin: 0.5 mg/dL (ref 0.2–1.2)
Total Protein: 8.1 g/dL (ref 6.0–8.3)

## 2024-06-02 LAB — PRO B NATRIURETIC PEPTIDE: NT-Pro BNP: 114 pg/mL (ref 0–287)

## 2024-06-12 ENCOUNTER — Ambulatory Visit: Payer: Self-pay

## 2024-06-12 ENCOUNTER — Encounter (HOSPITAL_COMMUNITY): Payer: Self-pay

## 2024-06-12 ENCOUNTER — Ambulatory Visit (HOSPITAL_COMMUNITY)
Admission: RE | Admit: 2024-06-12 | Discharge: 2024-06-12 | Disposition: A | Discharge status: Home / Self Care | Attending: Family Medicine | Admitting: Family Medicine

## 2024-06-12 VITALS — BP 127/81 | HR 64 | Temp 98.0°F | Resp 16

## 2024-06-12 DIAGNOSIS — R2242 Localized swelling, mass and lump, left lower limb: Secondary | ICD-10-CM

## 2024-06-12 NOTE — Discharge Instructions (Signed)
 You were seen today for localized swelling to the left foot.  As discussed, I recommend you follow up with your orthopedist for further work up and testing.  Please wear your boot or brace at home for comfort.

## 2024-06-12 NOTE — Telephone Encounter (Signed)
 FYI Only or Action Required?: FYI only for provider.  Patient was last seen in primary care on 06/01/2024 by Almarie Waddell NOVAK, NP.  Called Nurse Triage reporting Leg Swelling.  Symptoms began several weeks ago.  Interventions attempted: Other: rest, ice, elevation, brace, Tylenol .  Symptoms are: left ankle/foot swelling, pain, discoloration gradually worsening.  Triage Disposition: See HCP Within 4 Hours (Or PCP Triage)  Patient/caregiver understands and will follow disposition?: Yes             Copied from CRM [id number]. Topic: Clinical - Red Word Triage >> Jun 12, 2024 11:09 AM Montie POUR wrote: Red Word that prompted transfer to Nurse Triage:  Left lower leg is swollen double in size since last week. Pain up to level 6. When she works, it gets worse. >> Jun 12, 2024 11:11 AM Montie POUR wrote: She changed that this has been going on for 3 weeks Reason for Disposition  [1] Thigh, calf, or ankle swelling AND [2] only 1 side  Answer Assessment - Initial Assessment Questions Patient states she is concerned it could be a DVT and would like to be seen today. No appts at PCP office, scheduled at urgent care.  1. ONSET: When did the swelling start? (e.g., minutes, hours, days)     X 3 weeks. Worsening since yesterday.  2. LOCATION: What part of the leg is swollen?  Are both legs swollen or just one leg?     Left leg. Between ankle and foot.  3. SEVERITY: How bad is the swelling? (e.g., localized; mild, moderate, severe)     She states she is unable to put shoes on. She states moderate swelling.  4. REDNESS: Is there redness or signs of infection?     She states there is darker discoloration, oblong shaped between ankle and foot. She states there is some warmth in the area.  5. PAIN: Is the swelling painful to touch? If Yes, ask: How painful is it?   (Scale 1-10; mild, moderate or severe)     Yes, 6/10.  6. FEVER: Do you have a fever? If Yes, ask: What is it,  how was it measured, and when did it start?      No.  7. CAUSE: What do you think is causing the leg swelling?     Patient thinks it is a DVT.  8. MEDICAL HISTORY: Do you have a history of blood clots (e.g., DVT), cancer, heart failure, kidney disease, or liver failure?     DVT.  9. RECURRENT SYMPTOM: Have you had leg swelling before? If Yes, ask: When was the last time? What happened that time?     Yes, DVT about 6-7 years ago in her calf and was put on a blood thinner.  10. OTHER SYMPTOMS: Do you have any other symptoms? (e.g., chest pain, difficulty breathing)       Denies calf swelling or redness or pain; chest pain; difficulty breathing. She states sometimes she has been feeling chest congestion like a cold symptom that comes and goes. She also states sometimes tingling in her left toes.  11. PREGNANCY: Is there any chance you are pregnant? When was your last menstrual period?       N/A.  Protocols used: Leg Swelling and Edema-A-AH

## 2024-06-12 NOTE — ED Triage Notes (Signed)
 Patient here today with c/o left ankle pain and swelling X 3 weeks. Patient has a h/o blood clot in the same leg. Patient takes Plavix . Ice and elevation help.

## 2024-06-12 NOTE — ED Provider Notes (Signed)
 [facility]    CSN: [id number] Arrival date & time: 06/12/24  1345      History   Chief Complaint Chief Complaint  Patient presents with   Ankle Pain    Entered by patient    HPI [name] is a 58 y.o. female.    Ankle Pain  Patient is here for left medial foot/ankle pain and swelling.  She has had pain/swelling x 3 weeks.  She has seen ortho, xray negative.  She saw her pcp, and thought this was ligamentous.  Recommended she wear a brace, but she states this is painful to wear.  She has been trying to stay off of her foot and elevating it.  She states the swelling is actually better at this time, but still present.  She is concerned given her h/o DVT.  She is on plavix  for h/o STEMI.  No calf pain, tenderness or swelling otherwise.        Past Medical History:  Diagnosis Date   Acute ST elevation myocardial infarction (STEMI) involving right coronary artery (HCC) 06/24/2022   100% dRCA => DES PCI =.> Synergy XD DES 2.75 x 16-3.0 mm postdilation), reduced to 0%.   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  CULPRIT LESION-distal RCA 100% (left to right collaterals) => DES PCI (Synergy XD DES 2.75 x 16-3.0 mm postdilation), reduced to 0%.  60% proximal RCA.  Diffuse 35% proximal LAD.  Normal LCx.   Left leg DVT (HCC) 2014   Uterine leiomyoma     Patient Active Problem List   Diagnosis Date Noted   Right calf pain 09/28/2022   Hyperlipidemia with target LDL less than 70; > familial hyperlipidemia 06/25/2022   Preeclampsia 06/24/2022   Gestational diabetes 06/24/2022   Prediabetes 06/24/2022   Tobacco use 06/24/2022   Acute ST elevation myocardial infarction (STEMI) involving right coronary artery (HCC) 06/24/2022   Coronary artery disease involving native coronary artery of native heart with  unstable angina pectoris (HCC) 06/24/2022   Status post insertion of drug eluting coronary artery stent 06/24/2022   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 08/15/2020   Rotator cuff impingement syndrome of right shoulder 07/31/2020   Uterine fibroid 03/03/2016   Fibroids, intramural 02/10/2016   DVT (deep venous thrombosis) (HCC) 08/01/2014    Past Surgical History:  Procedure Laterality Date   BUNIONECTOMY Bilateral 09/07/2005   CARDIAC CATHETERIZATION     CESAREAN SECTION     x 2   CORONARY/GRAFT ACUTE MI REVASCULARIZATION N/A 06/24/2022   Procedure: Coronary/Graft Acute MI Revascularization;  Surgeon: Claudene Victory ORN, MD;  Location: [facility];  Service: Cardiovascular;  Laterality: N/A;   ROBOTIC ASSISTED TOTAL HYSTERECTOMY WITH BILATERAL SALPINGO OOPHERECTOMY N/A 03/03/2016   Procedure: XI ROBOTIC ASSISTED TOTAL HYSTERECTOMY WITH BILATERAL Salipingectomy ;  Surgeon: Maurilio Ship, MD;  Location: [facility];  Service: Gynecology;  Laterality: N/A;   TUBAL LIGATION      OB History   No obstetric history on file.      Home Medications    Prior to Admission medications   Medication Sig Start Date End Date Taking? Authorizing Provider  Bempedoic Acid  (NEXLETOL ) 180 MG TABS TAKE 1 TABLET BY MOUTH EVERY DAY 12/06/23   Tolia, Sunit, DO  clopidogrel  (PLAVIX ) 75 MG tablet Take 1 tablet (75 mg total) by  mouth daily. Take 4 tablets (total 300 mg) on day 1. Proceed with one tablet once daily after that. 09/06/23   Tolia, Sunit, DO  Evolocumab  (REPATHA  SURECLICK) 140 MG/ML SOAJ INJECT 140 MG INTO THE SKIN EVERY 14 (FOURTEEN) DAYS. 04/19/24   Tolia, Sunit, DO  Multiple Vitamins-Minerals (ONE-A-DAY FOR HER VITACRAVES) CHEW Chew 1 tablet by mouth in the morning.    [provider]  nitroGLYCERIN  (NITROSTAT ) 0.4 MG SL tablet PLACE 1 TABLET UNDER THE TONGUE EVERY 5 MINUTES AS NEEDED. Patient not taking: Reported on 06/01/2024 04/28/24   Dann Candyce RAMAN, MD    Family  History Family History  Problem Relation Age of Onset   Diabetes Mother    Depression Mother    Stroke Mother    Diabetes Maternal Grandmother    Heart disease Maternal Grandfather    Tuberculosis Paternal Grandfather     Social History Social History   Tobacco Use   Smoking status: Former    Current packs/day: 0.00    Average packs/day: 0.3 packs/day for 15.0 years (3.8 ttl pk-yrs)    Types: Cigarettes    Start date: 2007    Quit date: 2022    Years since quitting: 3.7   Smokeless tobacco: Never  Substance Use Topics   Alcohol use: No   Drug use: No     Allergies   Lipitor [atorvastatin ]   Review of Systems Review of Systems  Constitutional: Negative.   HENT: Negative.    Respiratory: Negative.    Cardiovascular: Negative.   Gastrointestinal: Negative.   Musculoskeletal:  Positive for joint swelling.     Physical Exam Triage Vital Signs ED Triage Vitals  Encounter Vitals Group     BP 06/12/24 1411 127/81     Girls Systolic BP Percentile --      Girls Diastolic BP Percentile --      Boys Systolic BP Percentile --      Boys Diastolic BP Percentile --      Pulse Rate 06/12/24 1411 64     Resp 06/12/24 1411 16     Temp 06/12/24 1411 98 F (36.7 C)     Temp Source 06/12/24 1411 Oral     SpO2 06/12/24 1411 99 %     Weight --      Height --      Head Circumference --      Peak Flow --      Pain Score 06/12/24 1413 7     Pain Loc --      Pain Education --      Exclude from Growth Chart --    No data found.  Updated Vital Signs BP 127/81 (BP Location: Right Arm)   Pulse 64   Temp 98 F (36.7 C) (Oral)   Resp 16   LMP 03/01/2016   SpO2 99%   Visual Acuity Right Eye Distance:   Left Eye Distance:   Bilateral Distance:    Right Eye Near:   Left Eye Near:    Bilateral Near:     Physical Exam Constitutional:      General: She is not in acute distress.    Appearance: Normal appearance. She is normal weight. She is not ill-appearing or  toxic-appearing.  Musculoskeletal:     Comments: No left calf pain, swelling or tenderness;  No TTP to the lateral/medial malleolus;  She does have swelling to the left medial foot, just distal to the ankle and near the arch of the foot;  this  area is tender;  no redness/warmth noted  Skin:    General: Skin is warm.  Neurological:     General: No focal deficit present.     Mental Status: She is alert.  Psychiatric:        Mood and Affect: Mood normal.      UC Treatments / Results  Labs (all labs ordered are listed, but only abnormal results are displayed) Labs Reviewed - No data to display  EKG   Radiology No results found.  Procedures Procedures (including critical care time)  Medications Ordered in UC Medications - No data to display  Initial Impression / Assessment and Plan / UC Course  I have reviewed the triage vital signs and the nursing notes.  Pertinent labs & imaging results that were available during my care of the patient were reviewed by me and considered in my medical decision making (see chart for details).  Patient was seen today for swelling to the left foot;  Exam is not consistent with DVT at this time.  I think this is likely musculoskeletal.  I have recommended she f/u with her orthopedist for further care and work up.   Final Clinical Impressions(s) / UC Diagnoses   Final diagnoses:  Localized swelling of left foot     Discharge Instructions      You were seen today for localized swelling to the left foot.  As discussed, I recommend you follow up with your orthopedist for further work up and testing.  Please wear your boot or brace at home for comfort.     ED Prescriptions   None    PDMP not reviewed this encounter.   Darral Longs, MD 06/12/24 1438

## 2024-08-30 ENCOUNTER — Other Ambulatory Visit: Payer: Self-pay | Admitting: Cardiology

## 2024-08-30 DIAGNOSIS — I2510 Atherosclerotic heart disease of native coronary artery without angina pectoris: Secondary | ICD-10-CM

## 2024-09-01 ENCOUNTER — Other Ambulatory Visit: Payer: Self-pay | Admitting: Cardiology

## 2024-09-19 NOTE — Progress Notes (Unsigned)
 [facility] [address] [place], [place] [address] [phone]  Date:  09/20/2024   Name:  [name]   DOB:  02-Oct-1965   MRN:  [id number]  PCP:  Watt Harlene BROCKS, MD    Chief Complaint: No chief complaint on file.   History of Present Illness:  [name] is a 59 y.o. very pleasant female patient who presents with the following:  Patient seen today with concern for possible allergic reaction.  I saw her most recently in May for a routine visit and also a cough She has history of STEMI in 2023 treated with PCI. She has been on Repatha  due to transaminitis with statin  Colon cancer screening Shingrix Flu shot Pneumonia vaccine Mammogram is up to date Status post hysterectomy  Discussed the use of AI scribe software for clinical note transcription with the patient, who gave verbal consent to proceed.  History of Present Illness      Patient Active Problem List   Diagnosis Date Noted   Right calf pain 09/28/2022   Hyperlipidemia with target LDL less than 70; > familial hyperlipidemia 06/25/2022   Preeclampsia 06/24/2022   Gestational diabetes 06/24/2022   Prediabetes 06/24/2022   Tobacco use 06/24/2022   Acute ST elevation myocardial infarction (STEMI) involving right coronary artery (HCC) 06/24/2022   Coronary artery disease involving native coronary artery of native heart with unstable angina pectoris (HCC) 06/24/2022   Status post insertion of drug eluting coronary artery stent 06/24/2022   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  CULPRIT LESION-distal RCA 100% (left to right collaterals) => DES PCI (Synergy XD DES 2.75 x 16-3.0 mm postdilation), reduced to 0%.  60% proximal RCA.  Diffuse 35% proximal LAD.  Normal LCx.   Left leg DVT [facility]) 2014   Uterine leiomyoma     Past Surgical History:  Procedure Laterality Date   BUNIONECTOMY Bilateral 09/07/2005   CARDIAC CATHETERIZATION     CESAREAN SECTION     x 2   CORONARY/GRAFT ACUTE MI REVASCULARIZATION N/A 06/24/2022   Procedure: Coronary/Graft Acute MI Revascularization;  Surgeon: Claudene Victory ORN, MD;  [facility]: [facility];  Service: Cardiovascular;  Laterality: N/A;   ROBOTIC ASSISTED TOTAL HYSTERECTOMY WITH BILATERAL SALPINGO OOPHERECTOMY N/A 03/03/2016   Procedure: XI ROBOTIC ASSISTED TOTAL HYSTERECTOMY WITH BILATERAL Salipingectomy ;  Surgeon: Maurilio Ship, MD;  Location: [facility];  Service: Gynecology;  Laterality: N/A;   TUBAL LIGATION      Social History[1]  Family History  Problem Relation Age of Onset   Diabetes Mother    Depression Mother    Stroke Mother    Diabetes Maternal Grandmother    Heart disease Maternal Grandfather    Tuberculosis Paternal Grandfather     Allergies[2]  Medication list has been reviewed and updated.  Medications Ordered Prior to Encounter[3]  Review of Systems:  As per HPI- otherwise negative.   Physical Examination: There were no vitals filed for this visit. There were no vitals filed for this visit. There is no height or weight on file to calculate BMI. Ideal Body Weight:    GEN: no acute distress. HEENT: Atraumatic, Normocephalic.  Ears and Nose: No external deformity. CV: RRR, No M/G/R. No JVD. No thrill. No extra heart  sounds. PULM: CTA B, no wheezes, crackles, rhonchi. No retractions. No resp. distress. No accessory muscle use. ABD: S, NT, ND, +BS. No rebound. No HSM. EXTR: No c/c/e PSYCH: Normally interactive. Conversant.    Assessment and Plan: No diagnosis found.  Assessment & Plan   Signed Harlene Schroeder, MD    [1]  Social History Tobacco Use   Smoking status: Former    Current packs/day: 0.00    Average packs/day: 0.3 packs/day for 15.0 years (3.8 ttl pk-yrs)    Types: Cigarettes    Start date: 2007    Quit date: 2022    Years since quitting: 4.0   Smokeless tobacco: Never  Substance Use Topics   Alcohol use: No   Drug use: No  [2]  Allergies Allergen Reactions   Lipitor [Atorvastatin ] Other (See Comments)    Elevated liver functions  [3]  Current Outpatient Medications on File Prior to Visit  Medication Sig Dispense Refill   Bempedoic Acid  (NEXLETOL ) 180 MG TABS TAKE 1 TABLET BY MOUTH EVERY DAY 30 tablet 0   clopidogrel  (PLAVIX ) 75 MG tablet Take 1 tablet (75 mg total) by mouth daily. Must make f/u appt for further refills 1st attempt 30 tablet 0   Evolocumab  (REPATHA  SURECLICK) 140 MG/ML SOAJ INJECT 140 MG INTO THE SKIN EVERY 14 (FOURTEEN) DAYS. 6 mL 1   Multiple Vitamins-Minerals (ONE-A-DAY FOR HER VITACRAVES) CHEW Chew 1 tablet by mouth in the morning.     nitroGLYCERIN  (NITROSTAT ) 0.4 MG SL tablet PLACE 1 TABLET UNDER THE TONGUE EVERY 5 MINUTES AS NEEDED. (Patient not taking: Reported on 06/01/2024) 25 tablet 8   No current facility-administered medications on file prior to visit.   "

## 2024-09-20 ENCOUNTER — Ambulatory Visit: Admitting: Family Medicine

## 2024-09-20 VITALS — BP 120/74 | HR 72 | Temp 98.5°F | Ht 66.0 in | Wt 160.2 lb

## 2024-09-20 DIAGNOSIS — I2510 Atherosclerotic heart disease of native coronary artery without angina pectoris: Secondary | ICD-10-CM | POA: Diagnosis not present

## 2024-09-20 DIAGNOSIS — Z23 Encounter for immunization: Secondary | ICD-10-CM

## 2024-09-20 DIAGNOSIS — E785 Hyperlipidemia, unspecified: Secondary | ICD-10-CM

## 2024-09-20 DIAGNOSIS — Z1211 Encounter for screening for malignant neoplasm of colon: Secondary | ICD-10-CM

## 2024-09-20 DIAGNOSIS — Z130 Encounter for screening for diseases of the blood and blood-forming organs and certain disorders involving the immune mechanism: Secondary | ICD-10-CM | POA: Diagnosis not present

## 2024-09-20 DIAGNOSIS — Z131 Encounter for screening for diabetes mellitus: Secondary | ICD-10-CM | POA: Diagnosis not present

## 2024-09-20 MED ORDER — NEXLETOL 180 MG PO TABS: 1.0000 | ORAL_TABLET | Freq: Every day | ORAL | 3 refills | Status: AC

## 2024-09-20 MED ORDER — CLOPIDOGREL BISULFATE 75 MG PO TABS: 75.0000 mg | ORAL_TABLET | Freq: Every day | ORAL | 3 refills | Status: AC

## 2024-09-20 MED ORDER — REPATHA SURECLICK 140 MG/ML ~~LOC~~ SOAJ: 1.0000 mL | SUBCUTANEOUS | 3 refills | Status: AC

## 2024-09-20 NOTE — Patient Instructions (Addendum)
 Good to see you today- we did your refills  I will be in touch with your labs and we will send you a cologuard kit for home Flu shot today I do not see a cardiology visit since last year- perhaps plan to see them in the next few months

## 2024-09-21 ENCOUNTER — Encounter: Payer: Self-pay | Admitting: Family Medicine

## 2024-09-21 LAB — CBC
HCT: 39 % (ref 36.0–46.0)
Hemoglobin: 13 g/dL (ref 12.0–15.0)
MCHC: 33.3 g/dL (ref 30.0–36.0)
MCV: 88.2 fl (ref 78.0–100.0)
Platelets: 369 K/uL (ref 150.0–400.0)
RBC: 4.42 Mil/uL (ref 3.87–5.11)
RDW: 13.7 % (ref 11.5–15.5)
WBC: 4.3 K/uL (ref 4.0–10.5)

## 2024-09-21 LAB — LIPID PANEL
Cholesterol: 131 mg/dL (ref 28–200)
HDL: 60.2 mg/dL
LDL Cholesterol: 54 mg/dL (ref 10–99)
NonHDL: 71.09
Total CHOL/HDL Ratio: 2
Triglycerides: 83 mg/dL (ref 10.0–149.0)
VLDL: 16.6 mg/dL (ref 0.0–40.0)

## 2024-09-21 LAB — HEMOGLOBIN A1C: Hgb A1c MFr Bld: 6 % (ref 4.6–6.5)

## 2024-10-26 ENCOUNTER — Ambulatory Visit: Admitting: Cardiology
